# Patient Record
Sex: Female | Born: 1990 | Race: Black or African American | Hispanic: No | Marital: Single | Smoking: Former smoker
Health system: Southern US, Community
[De-identification: ages and names within clinical notes are randomized; demographics above are authoritative.]

## PROBLEM LIST (undated history)

## (undated) ENCOUNTER — Inpatient Hospital Stay (HOSPITAL_COMMUNITY): Payer: Self-pay

## (undated) DIAGNOSIS — L272 Dermatitis due to ingested food: Secondary | ICD-10-CM

## (undated) DIAGNOSIS — IMO0002 Reserved for concepts with insufficient information to code with codable children: Secondary | ICD-10-CM

## (undated) DIAGNOSIS — E559 Vitamin D deficiency, unspecified: Secondary | ICD-10-CM

## (undated) DIAGNOSIS — I10 Essential (primary) hypertension: Secondary | ICD-10-CM

## (undated) DIAGNOSIS — N912 Amenorrhea, unspecified: Secondary | ICD-10-CM

## (undated) DIAGNOSIS — J309 Allergic rhinitis, unspecified: Secondary | ICD-10-CM

## (undated) DIAGNOSIS — R229 Localized swelling, mass and lump, unspecified: Secondary | ICD-10-CM

## (undated) DIAGNOSIS — G4733 Obstructive sleep apnea (adult) (pediatric): Secondary | ICD-10-CM

## (undated) DIAGNOSIS — Z91018 Allergy to other foods: Secondary | ICD-10-CM

## (undated) DIAGNOSIS — N83209 Unspecified ovarian cyst, unspecified side: Secondary | ICD-10-CM

## (undated) DIAGNOSIS — T783XXA Angioneurotic edema, initial encounter: Secondary | ICD-10-CM

## (undated) DIAGNOSIS — D649 Anemia, unspecified: Secondary | ICD-10-CM

## (undated) HISTORY — DX: Allergy to other foods: Z91.018

## (undated) HISTORY — DX: Amenorrhea, unspecified: N91.2

## (undated) HISTORY — DX: Localized swelling, mass and lump, unspecified: R22.9

## (undated) HISTORY — PX: DENTAL SURGERY: SHX609

## (undated) HISTORY — DX: Dermatitis due to ingested food: L27.2

## (undated) HISTORY — DX: Angioneurotic edema, initial encounter: T78.3XXA

## (undated) HISTORY — DX: Allergic rhinitis, unspecified: J30.9

## (undated) HISTORY — PX: WISDOM TOOTH EXTRACTION: SHX21

## (undated) HISTORY — DX: Reserved for concepts with insufficient information to code with codable children: IMO0002

## (undated) HISTORY — DX: Anemia, unspecified: D64.9

---
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
## 2010-03-18 ENCOUNTER — Emergency Department (HOSPITAL_COMMUNITY): Admission: EM | Admit: 2010-03-18 | Discharge: 2010-03-18 | Payer: Self-pay | Admitting: Emergency Medicine

## 2010-05-30 ENCOUNTER — Emergency Department (HOSPITAL_COMMUNITY)
Admission: EM | Admit: 2010-05-30 | Discharge: 2010-05-30 | Payer: Self-pay | Source: Home / Self Care | Admitting: Emergency Medicine

## 2010-08-01 LAB — DIFFERENTIAL
Basophils Absolute: 0 10*3/uL (ref 0.0–0.1)
Basophils Relative: 0 % (ref 0–1)
Eosinophils Absolute: 0.3 10*3/uL (ref 0.0–0.7)
Eosinophils Relative: 3 % (ref 0–5)
Lymphocytes Relative: 39 % (ref 12–46)
Lymphs Abs: 4.2 10*3/uL — ABNORMAL HIGH (ref 0.7–4.0)
Monocytes Absolute: 0.7 10*3/uL (ref 0.1–1.0)
Monocytes Relative: 6 % (ref 3–12)
Neutro Abs: 5.6 10*3/uL (ref 1.7–7.7)
Neutrophils Relative %: 52 % (ref 43–77)

## 2010-08-01 LAB — URINALYSIS, ROUTINE W REFLEX MICROSCOPIC
Bilirubin Urine: NEGATIVE
Glucose, UA: NEGATIVE mg/dL
Hgb urine dipstick: NEGATIVE
Ketones, ur: NEGATIVE mg/dL
Nitrite: NEGATIVE
Protein, ur: NEGATIVE mg/dL
Specific Gravity, Urine: 1.022 (ref 1.005–1.030)
Urobilinogen, UA: 0.2 mg/dL (ref 0.0–1.0)
pH: 6 (ref 5.0–8.0)

## 2010-08-01 LAB — URINE MICROSCOPIC-ADD ON

## 2010-08-01 LAB — POCT I-STAT, CHEM 8
BUN: 6 mg/dL (ref 6–23)
Calcium, Ion: 1.13 mmol/L (ref 1.12–1.32)
Chloride: 108 mEq/L (ref 96–112)
Creatinine, Ser: 0.9 mg/dL (ref 0.4–1.2)
Glucose, Bld: 95 mg/dL (ref 70–99)
HCT: 41 % (ref 36.0–46.0)
Hemoglobin: 13.9 g/dL (ref 12.0–15.0)
Potassium: 3.5 mEq/L (ref 3.5–5.1)
Sodium: 141 mEq/L (ref 135–145)
TCO2: 22 mmol/L (ref 0–100)

## 2010-08-01 LAB — CBC
HCT: 38.9 % (ref 36.0–46.0)
Hemoglobin: 12.7 g/dL (ref 12.0–15.0)
MCH: 25.7 pg — ABNORMAL LOW (ref 26.0–34.0)
MCHC: 32.6 g/dL (ref 30.0–36.0)
MCV: 78.7 fL (ref 78.0–100.0)
Platelets: 256 10*3/uL (ref 150–400)
RBC: 4.94 MIL/uL (ref 3.87–5.11)
RDW: 14.3 % (ref 11.5–15.5)
WBC: 10.8 10*3/uL — ABNORMAL HIGH (ref 4.0–10.5)

## 2010-08-01 LAB — POCT PREGNANCY, URINE: Preg Test, Ur: NEGATIVE

## 2010-09-04 ENCOUNTER — Inpatient Hospital Stay (INDEPENDENT_AMBULATORY_CARE_PROVIDER_SITE_OTHER)
Admission: RE | Admit: 2010-09-04 | Discharge: 2010-09-04 | Disposition: A | Discharge status: Home / Self Care | Payer: Self-pay | Source: Ambulatory Visit | Attending: Emergency Medicine | Admitting: Emergency Medicine

## 2010-09-04 DIAGNOSIS — L259 Unspecified contact dermatitis, unspecified cause: Secondary | ICD-10-CM

## 2010-09-10 ENCOUNTER — Ambulatory Visit: Payer: Self-pay | Admitting: Internal Medicine

## 2010-09-12 ENCOUNTER — Encounter: Payer: Self-pay | Admitting: Internal Medicine

## 2010-09-14 ENCOUNTER — Ambulatory Visit: Payer: Self-pay | Admitting: Internal Medicine

## 2010-09-24 ENCOUNTER — Telehealth: Payer: Self-pay | Admitting: Internal Medicine

## 2010-09-24 ENCOUNTER — Ambulatory Visit (INDEPENDENT_AMBULATORY_CARE_PROVIDER_SITE_OTHER): Payer: Medicaid Other | Admitting: Internal Medicine

## 2010-09-24 ENCOUNTER — Encounter: Payer: Self-pay | Admitting: Internal Medicine

## 2010-09-24 VITALS — BP 110/68 | HR 76 | Ht 63.0 in | Wt 238.0 lb

## 2010-09-24 DIAGNOSIS — L299 Pruritus, unspecified: Secondary | ICD-10-CM

## 2010-09-24 NOTE — Telephone Encounter (Signed)
OV with CDY at 4:15 today- okayed per CDY.

## 2010-09-24 NOTE — Patient Instructions (Signed)
Try 2 different kinds of antihistamine medicines at once:  Ranitidine 150 mg twice every day- sold as a stomach acid medicine  And  generic Allegra 180-   called fexofenadine 180  For 24 hour/ once daily use.   You can still use the cortisone cream if you have to.

## 2010-09-24 NOTE — Progress Notes (Signed)
  Subjective:    Patient ID: Brittany Cordova, female    DOB: Sep 22, 1990, 20 y.o.   MRN: [id number]  HPI 09/24/10- 20 yoF concerned about generalized itching w/o visible rash.  Last seen 2 years ago for rash,- old note reviewed.  Rash and itching started again about a week ago. Generalized.   She went to urgent care and was given triamcinolone cream and hydroxyzine  Off these meds, she is beginning to itch again- intertriginious areas, trunk and back. Doesn't describe large welts. Denies exposures including fleas. Questions heat. No fever, obvious infection, arthralgias or unique exposures. She is in school, with no unusual exposures.    Review of Systems Constitutional:   No weight loss, night sweats,  Fevers, chills, fatigue, lassitude. HEENT:   No headaches,  Difficulty swallowing,  Tooth/dental problems,  Sore throat,                No sneezing, itching, ear ache, nasal congestion, post nasal drip,   CV:  No chest pain,  Orthopnea, PND, swelling in lower extremities, anasarca, dizziness, palpitations  GI  No heartburn, indigestion, abdominal pain, nausea, vomiting, diarrhea, change in bowel habits, loss of appetite  Resp: No shortness of breath with exertion or at rest.  No excess mucus, no productive cough,  No non-productive cough,  No coughing up of blood.  No change in color of mucus.  No wheezing. Skin: no rash or lesions.  GU: no dysuria, change in color of urine, no urgency or frequency.  No flank pain.  MS:  No joint pain or swelling.  No decreased range of motion.  No back pain.  Psych:  No change in mood or affect. No depression or anxiety.  No memory loss.      Objective:   Physical Exam General- Alert, Oriented, Affect-appropriate, Distress- none acute  Skin- rash- dry skin with a few tiny pinpoint red marks on upper chest, no excoriation  Lymphadenopathy- none  Head- atraumatic  Eyes- Gross vision intact, PERRLA, conjunctivae clear secretions  Ears- Hearing,  canals, Tm L ,   R ,- normal  Nose- Clear, No-Septal dev, mucus, polyps, erosion, perforation   Throat- Mallampati II , mucosa clear , drainage- none, tonsils- atrophic  Neck- flexible , trachea midline, no stridor , thyroid nl, carotid no bruit  Chest - symmetrical excursion , unlabored     Heart/CV- RRR , no murmur , no gallop  , no rub, nl s1 s2                     - JVD- none , edema- none, stasis changes- none, varices- none     Lung- clear to P&A, wheeze- none, cough- none , dullness-none, rub- none     Chest wall- Abd- tender-no, distended-no, bowel sounds-present, HSM- no  Br/ Gen/ Rectal- Not done, not indicated  Extrem- cyanosis- none, clubbing, none, atrophy- none, strength- nl  Neuro- grossly intact to observation          Assessment & Plan:

## 2010-09-24 NOTE — Telephone Encounter (Signed)
Spoke w/ pt and she states she has had a rash all over her body since the beginning of April. Pt states she could not get an apt w/ Dr. Maple Hudson so she went to urgent care April 17 and they gave her tricinolone cream and pills for itching. Pt states she has also been using hydrocortisone cream, taking oatmeal baths and using oatmeal shower gel. Pt was last seen 06/2008 and told to f/u in 6 months. Next available isn't until 6/12 and pt states she can't wait that long to be seen. Please advise Dr. Maple Hudson. Thanks  Allergies  Allergen Reactions  . Ibuprofen     Carver Fila, CMA

## 2010-09-30 ENCOUNTER — Encounter: Payer: Self-pay | Admitting: Internal Medicine

## 2010-09-30 DIAGNOSIS — L299 Pruritus, unspecified: Secondary | ICD-10-CM | POA: Insufficient documentation

## 2010-09-30 NOTE — Assessment & Plan Note (Addendum)
This is more than dry skin probably, and may be an allergic response.We will start with broad antihistamine approach I discussed lisinpopril

## 2010-10-02 NOTE — Assessment & Plan Note (Signed)
[facility]                             PULMONARY OFFICE NOTE   EDITH, GROLEAU                   MRN:          [id number]  DATE:03/27/2007                            DOB:          [birth year]    Date of visit:  March 27, 2007.   PROBLEM:  A 20 year old African-American teenager comes with her mother  at the kind request of Dr. Bruna Potter, concerned about her breathing during  sleep.   HISTORY:  She and her mother co-sleep, although the daughter sleeps  above the sheet and mother sleeps under it.  They began this when mother  had a stroke.  Mother tells her that she stops breathing and snores  loudly.  The patient notices occasional hot flashes and cannot get back  to sleep.  She wakes in the morning with dry mouth consistent with mouth  breathing.  Bedtime is between 9 and 10 p.m.  Sometimes sleep latency  can be over an hour, waking 2-3 times during the night before up at 7:30  a.m. for school.   REVIEW OF SYSTEMS:  Snoring without nasal congestion, witnessed apneas,  difficulty initiating sleep, nonrestorative sleep.  Sleepiness during  the day can interfere with schoolwork.  Some bruxism.  No unusual  behaviors.   PAST MEDICAL HISTORY:  No surgeries.  Some allergic rhinitis.  She  believes thyroid lab work has tested normal.   OBJECTIVE:  Weight 232 pounds, blood pressure 118/76, pulse regular and  92, room air saturation 100%.  She is an obese, quiet, calm young lady, alert and oriented.  NEUROLOGIC:  Unremarkable through observation.  NECK:  Short, thick neck.  No thyromegaly.  HEENT:  Nasal airway is unobstructed.  Palate is spacing 3-4/4 without  stridor.  LUNGS:  Lung fields clear.  HEART:  Heart sounds normal.   IMPRESSION:  The primary issue is whether she has obstructive sleep  apnea.  We have discussed improvements in sleep hygiene.   PLAN:  We are scheduling a split sleep study at the [facility] center  and she will  return after that has been completed for follow-up.  I  appreciate the chance to see her.     Clinton D. Maple Hudson, MD, [facility], FACP  Electronically Signed    CDY/MedQ  DD: 03/27/2007  DT: 03/29/2007  Job #: [id number]   cc:   Clyda Greener, M.D.  [facility]

## 2010-10-02 NOTE — Assessment & Plan Note (Signed)
[facility]                             PULMONARY OFFICE NOTE   [name], [name]                   MRN:          [id number]  DATE:04/23/2007                            DOB:          Apr 16, 1991    SLEEP MEDICINE FOLLOW-UP:   PROBLEM:  Hypersomnia.   HISTORY:  She and her mother return after her nocturnal polysomnogram,  which was unremarkable.  Sleep architecture was notable only for  frequent wakings, not unexpected in an unfamiliar environment,  especially for a young person.  There was no significant sleep-related  breathing disorder with an AHI of 0 and little to no snoring noted.  There was no movement disorder.  On discussion with her today, she is  unclear as to whether she recognizes symptoms that I described,  exploring the possibilities of sleep paralysis or cataplexy.  Her  Epworth Sleepiness Score was 10/17.   OBJECTIVE:  Weight 235 pounds, BP 122/70, pulse 91, room air saturation  100%.  This is an obese, somewhat reserved young lady.  Breathing is unlabored.  Pulse regular.   IMPRESSION:  Nonspecific hypersomnia.  She describes a regular sleep  schedule with limited caffeine intake.  It is possible that she has a  mild partial narcolepsy syndrome or idiopathic hypersomnia.   PLAN:  1. She was given a booklet on sleep hygiene and we discussed good      sleep practices.  2. Schedule multiple sleep latency test for comparison with her      previous nocturnal polysomnogram.  3. Schedule return in 1 month, earlier p.r.n.     Clinton D. Maple Hudson, MD, [facility], FACP  Electronically Signed    CDY/MedQ  DD: 04/23/2007  DT: 04/23/2007  Job #: [id number]   cc:   Clyda Greener, MD  [facility]

## 2010-10-02 NOTE — Group Therapy Note (Signed)
NAMEDAIZHA, ANAND NO.:  [id number]   MEDICAL RECORD NO.:  [id number]          PATIENT TYPE:  WOC   LOCATION:  [facility]                   FACILITY:  [facility]   PHYSICIAN:  Argentina Donovan, MD        DATE OF BIRTH:  05/11/1991   DATE OF SERVICE:  12/02/2007                                  CLINIC NOTE   REASON FOR VISIT:  The patient was referred here for a PCOS workup.   SUBJECTIVE:  Ms. Filsinger is a very pleasant 20 year old female who  presented today upon referral by the [facility] over the  Health Department for a PCOS workup.  Over there, they had noted her  morbid obesity and acanthosis nigricans, and they were concerned that  she had not had her periods in 2 to 3 years.  She has been on Depo-  Provera since the age of 59, and this is when her periods became  irregular and eventually stopped.  Prior to that, her periods are very  regular overall, and she states she would have a cycle once a month that  lasted approximately 5 days.  She denies today having any unusual hair  grows or other concerns.   MENSTRUAL HISTORY:  She began menstruating at age 38, and menstrual  cycles were regular prior to starting Depo-Provera.  Her periods lasted  approximately 5 days and was a light flow.  She did not complain of any  pain with her periods.  Currently, she is using Depo-Provera for her  contraception and is not trying to get pregnant.  She has never been  pregnant before.  She is up to date on her Pap smear, and her last one  was performed on August 12, 2007, but will need to be repeated due to  inadequate sample.   She has never had any surgeries.   She does have a very strong family history of diabetes, high blood  pressure, strokes, and blood clots.   She has no personal medical history.   SOCIAL HISTORY:  She currently lives at home with her mother and her  grandmother, and she does not work.  She does not smoke or use alcohol,  and does not use  drugs.   REVIEW OF SYSTEMS:  Positive for swelling in the hands and feet, which  has been chronic.  She also complains of some shortness of breath and  dizzy spells.   PHYSICAL EXAMINATION:  GENERAL:  This is a pleasant, morbidly obese,  African American female in no apparent distress.  VITAL SIGNS:  Temperature is 97.6, pulse 75, blood pressure 112/74,  weight 229.5 pounds, height 5 feet 2 inches, and respiratory rate 20.  CARDIOVASCULAR:  Regular rate and rhythm.  No murmurs, rubs, or gallops.  LUNGS:  Clear to auscultation bilaterally.  ABDOMEN:  Nontender and nondistended.  Soft with positive bowel sounds.  No hepatosplenomegaly is appreciated.  There are striae across the  abdomen.  SKIN:  Acanthosis nigricans along the neck and antecubital fossa  bilaterally.  EXTREMITIES:  Normal distal pulses and no appreciable swelling.   ASSESSMENT/PLAN:  Ms. Spengler is an  20 year old female with morbid  obesity at risk for metabolic syndrome.   PLAN:  The patient has already had due workup over the [facility] the [facility] in terms of evaluating  her thyroid and getting her to a nutritionist to start a diet and  exercise plan.  The patient has been following this closely and has  actually lost 4 pounds in a month per her report.  It was encouraged  that she continue to avoid further problems to her body in terms of high  blood pressure, diabetes, or PCOS given her  strong family history.  The  patient agrees with this plan, so she desired to get pregnant at a later  point.  I would consider starting Yasmin for short period of time to  regulate her cycles and metformin to help her ovulate.  I would continue  to monitor her very closely in terms of her cholesterol and her blood  pressure as well as her blood sugars to make sure she does not develop  any of the consequences of the metabolic syndrome.           ______________________________  Argentina Donovan, MD     PR/MEDQ  D:  12/03/2007  T:  12/04/2007  Job:  [id number]

## 2010-10-02 NOTE — Procedures (Signed)
Brittany Cordova, Brittany Cordova            ACCOUNT NO.:  [id number]   MEDICAL RECORD NO.:  [id number]          PATIENT TYPE:  OUT   LOCATION:  SLEEP CENTER                 FACILITY:  [facility]   PHYSICIAN:  Clinton D. Maple Hudson, MD, FCCP, FACPDATE OF BIRTH:  1990-01-[birth year]   DATE OF STUDY:  04/09/2007                            NOCTURNAL POLYSOMNOGRAM   REFERRING PHYSICIAN:  Clinton D. Young, MD, FCCP, FACP   INDICATIONS FOR STUDY:  Hypersomnia with sleep apnea.   EPWORTH SLEEPINESS SCORE:  10/24.   BMI 41.2, weight 225 pounds, height 62 inches, neck 15 inches.   MEDICATIONS:  No home medications were listed.   SLEEP ARCHITECTURE:  Total sleep time 334 minutes with sleep efficiency  845. Stage 1 was 5%; Stage 2, 65%; Stage 3, 18%; REM 10.9% of total  sleep time. Sleep latency 40 minutes, REM latency 117 minutes. Awake  after sleep onset 22 minutes. Arousal index 6.8. No bedtime medication  listed.   RESPIRATORY DATA:  Apnea hypopnea index (AHI) 0 obstructive events per  hour, which is normal.   OXYGEN DATA:  No significant snoring. Oxygen desaturation to a nadir of  91% on room air with mean oxygen saturation through the study 97%.   CARDIAC DATA:  Normal sinus rhythm.   MOVEMENT/PARASOMNIA:  No significant limb jerks. No bathroom trips.   IMPRESSION/RECOMMENDATIONS:  1. Sleep architecture was remarkable primarily for frequent brief      waking's, attributable to the unfamiliar sleep environment and      reduced time in REM.  2. No significant sleep related breathing disorder. AHI zero in all      sleep positions with no snoring noted by the technician.     Clinton D. Maple Hudson, MD, [facility], FACP  Diplomate, Biomedical engineer of Sleep Medicine  Electronically Signed    CDY/MEDQ  D:  04/12/2007 15:34:18  T:  04/12/2007 20:44:26  Job:  [id number]

## 2010-10-29 ENCOUNTER — Encounter: Payer: Self-pay | Admitting: Internal Medicine

## 2010-11-01 ENCOUNTER — Encounter: Payer: Self-pay | Admitting: Internal Medicine

## 2010-11-01 ENCOUNTER — Ambulatory Visit (INDEPENDENT_AMBULATORY_CARE_PROVIDER_SITE_OTHER): Payer: Medicaid Other | Admitting: Internal Medicine

## 2010-11-01 VITALS — BP 120/74 | HR 73 | Ht 63.0 in | Wt 231.4 lb

## 2010-11-01 DIAGNOSIS — G4733 Obstructive sleep apnea (adult) (pediatric): Secondary | ICD-10-CM

## 2010-11-01 DIAGNOSIS — L299 Pruritus, unspecified: Secondary | ICD-10-CM

## 2010-11-01 NOTE — Patient Instructions (Signed)
Order- [facility]- Split protocol NPSG - dx OSA.    Tell the sleep center she may need to sleep in a recliner

## 2010-11-01 NOTE — Assessment & Plan Note (Signed)
Body habitus, long palate and hx snoring favor dx OSA, with potential cardiovascular consequences. Lungs sound clear and neck veins aren't distended, but she has been putting up with orthopnea and substernal discomfort for months now probably. We will arrange new NPSG, which might have to be done in a recliner.

## 2010-11-01 NOTE — Assessment & Plan Note (Signed)
Doing better with Aveeno. We discussed Cetaphil and Eucerin.

## 2010-11-01 NOTE — Progress Notes (Signed)
Subjective:    Patient ID: Brittany Cordova Reasons, female    DOB: 19-Dec-1990, 20 y.o.   MRN: [id number]  HPI  Subjective:    Patient ID: Brittany Cordova Reasons, female    DOB: 04-26-1991, 20 y.o.   MRN: [id number]  HPI 09/24/10- 20 yoF concerned about generalized itching w/o visible rash.  Last seen 2 years ago for rash,- old note reviewed.  Rash and itching started again about a week ago. Generalized.   She went to urgent care and was given triamcinolone cream and hydroxyzine  Off these meds, she is beginning to itch again- intertriginious areas, trunk and back. Doesn't describe large welts. Denies exposures including fleas. Questions heat. No fever, obvious infection, arthralgias or unique exposures. She is in school, with no unusual exposures.   11/01/10- Previously seen for allergy evaluation of itching.  Now New Problem:  referred by [facility] for evaluation of sleep. Dr Italy Hilty. A sleep study 04/09/07 was negative , AHI 0/hr.  Now she has cardiac enlargement and she says there may be some question about a mass near her heart which she thinks Dr Rennis Golden is evaluating.  Mother tells her she snores. Feels mild orthopnea over last several months- sleeps poorly.  Wakes occasionally - nocturia x 2-3, but no choke or strangle. Prefers to sleep on sides, but that gives substernal soreness. Cough productive clear mucus. Nose tends to be congested. No ENT surgery.   Review of Systems Constitutional:   No weight loss, night sweats,  Fevers, chills, fatigue, lassitude. HEENT:   No headaches,  Difficulty swallowing,  Tooth/dental problems,  Sore throat,                No sneezing, itching, ear ache, nasal congestion, post nasal drip,   CV:  No chest pain,  Orthopnea, PND, swelling in lower extremities, anasarca, dizziness, palpitations  GI  No heartburn, indigestion, abdominal pain, nausea, vomiting, diarrhea, change in bowel habits, loss of appetite  Resp: No acute shortness of breath with exertion or at  rest.  No excess mucus, no productive cough,  No non-productive cough,  No coughing up of blood.  No change in color of mucus.  No wheezing. Skin: no rash or lesions.  GU: no dysuria, change in color of urine, no urgency or frequency.  No flank pain.  MS:  No joint pain or swelling.  No decreased range of motion.  No back pain.  Psych:  No change in mood or affect. No depression or anxiety.  No memory loss.      Objective:   Physical Exam General- Alert, Oriented, Affect-appropriate, Distress- none acute   obese Skin-Mild eczematoid hyperkeratotic skin at neckline. She shows a couple of tiny, light brown spots on skin of forearms and legs- possibly insect bites, not excoriated.   Lymphadenopathy- none  Head- atraumatic  Eyes- Gross vision intact, PERRLA, conjunctivae clear secretions  Ears- Hearing, canals, Tm- normal  Nose- Clear, No-Septal dev, mucus, polyps, erosion, perforation   Throat- Mallampati III-IV , mucosa clear , drainage- none, tonsils- atrophic  Tongue stud  Neck- flexible , trachea midline, no stridor , thyroid nl, carotid no bruit   Short thick neck  Chest - symmetrical excursion , unlabored     Heart/CV- RRR , no murmur , no gallop  , no rub, nl s1 s2                     - JVD- none , edema- none, stasis changes- none,  varices- none     Lung- clear to P&A, wheeze- none, cough- none , dullness-none, rub- none     Chest wall- Abd- tender-no, distended-no, bowel sounds-present, HSM- no  Br/ Gen/ Rectal- Not done, not indicated  Extrem- cyanosis- none, clubbing, none, atrophy- none, strength- nl  Neuro- grossly intact to observation          Assessment & Plan:     Review of Systems     Objective:   Physical Exam        Assessment & Plan:

## 2010-11-26 ENCOUNTER — Ambulatory Visit (HOSPITAL_BASED_OUTPATIENT_CLINIC_OR_DEPARTMENT_OTHER): Payer: Self-pay | Attending: Internal Medicine

## 2010-11-26 DIAGNOSIS — G4710 Hypersomnia, unspecified: Secondary | ICD-10-CM | POA: Insufficient documentation

## 2010-11-26 DIAGNOSIS — G4733 Obstructive sleep apnea (adult) (pediatric): Secondary | ICD-10-CM

## 2010-12-01 DIAGNOSIS — G4710 Hypersomnia, unspecified: Secondary | ICD-10-CM

## 2010-12-01 DIAGNOSIS — G4730 Sleep apnea, unspecified: Secondary | ICD-10-CM

## 2010-12-01 NOTE — Procedures (Signed)
NAMEGLENDORA, CLOUATRE            ACCOUNT NO.:  [id number]  MEDICAL RECORD NO.:  [id number]          PATIENT TYPE:  OUT  LOCATION:  SLEEP CENTER                 FACILITY:  [facility]  PHYSICIAN:  Clinton D. Maple Hudson, MD, FCCP, FACPDATE OF BIRTH:  [birth year] 16[birth year] 1989  DATE OF STUDY:  11/26/2010                           NOCTURNAL POLYSOMNOGRAM  REFERRING PHYSICIAN:  CLINTON D YOUNG  INDICATION FOR STUDY:  Hypersomnia with sleep apnea.  EPWORTH SLEEPINESS SCORE:  Epworth sleepiness score 9/24, BMI 40.9. Weight 231 pounds, height 63 inches.  Neck 15 inches.  Home medications are charted and reviewed.  A baseline diagnostic NPSG on April 09, 2007 had recorded AHI of 0 per hour.  MEDICATIONS:  SLEEP ARCHITECTURE:  Total sleep time 195 minutes with sleep efficiency 46.2%.  Stage I was 16.4%, stage II 77.4%, stage III 2.6%, REM 3.6% of total sleep time.  Sleep latency 170 minutes, REM latency 110 minutes, awake after sleep onset 57.5 minutes, arousal index 45.2.  Bedtime medication:  None.  Sleep architecture was significant for sleep onset shortly after 1 a.m. and frequent brief spontaneous waking throughout the study night.  She slept in a recliner chair.  RESPIRATORY DATA:  Apnea/hypopnea index (AHI) 0 per hour.  No scored respiratory events were noted.  OXYGEN DATA:  No snoring noted by the technician.  Oxygen desaturation to a nadir of 95% and mean oxygen saturation of 98% on room air.  CARDIAC DATA:  Normal sinus rhythm.  MOVEMENT-PARASOMNIA:  No significant movement disturbance.  No bathroom trips.  IMPRESSIONS-RECOMMENDATIONS: 1. Short total sleep with delayed sleep onset until 1 a.m. and     frequent nonspecific brief spontaneous waking throughout the study.     Consider possibility of delayed sleep phase syndrome. 2. No significant respiratory disturbance.  AHI 0 per hour (normal     range 0-5 per hour).  No snoring.  Oxygen desaturation to a nadir     of 95% with a mean oxygen  saturation through the study of 98% on     room air.     Clinton D. Maple Hudson, MD, [facility], FACP Diplomate, Biomedical engineer of Sleep Medicine Electronically Signed    CDY/MEDQ  D:  12/01/2010 11:50:45  T:  12/01/2010 21:19:32  Job:  [id number]

## 2010-12-05 ENCOUNTER — Encounter: Payer: Self-pay | Admitting: Internal Medicine

## 2010-12-05 ENCOUNTER — Ambulatory Visit (INDEPENDENT_AMBULATORY_CARE_PROVIDER_SITE_OTHER): Payer: Self-pay | Admitting: Internal Medicine

## 2010-12-05 VITALS — BP 116/80 | HR 61 | Ht 63.0 in | Wt 228.2 lb

## 2010-12-05 DIAGNOSIS — G4733 Obstructive sleep apnea (adult) (pediatric): Secondary | ICD-10-CM

## 2010-12-05 DIAGNOSIS — J309 Allergic rhinitis, unspecified: Secondary | ICD-10-CM

## 2010-12-05 NOTE — Assessment & Plan Note (Signed)
No sleep disordered breathing. 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  I will see her back if needed.

## 2010-12-05 NOTE — Progress Notes (Signed)
Subjective:    Patient ID: Brittany Cordova, female    DOB: Jun 07, 1990, 20 y.o.   MRN: [id number]  HPI  Subjective:    Patient ID: Brittany Cordova, female    DOB: Dec 27, 1990, 20 y.o.   MRN: [id number]  HPI  Subjective:    Patient ID: Brittany Cordova, female    DOB: [birth year], 20 y.o.   MRN: [id number]  HPI 09/24/10- 20 yoF concerned about generalized itching w/o visible rash.  Last seen 2 years ago for rash,- old note reviewed.  Rash and itching started again about a week ago. Generalized.   She went to urgent care and was given triamcinolone cream and hydroxyzine  Off these meds, she is beginning to itch again- intertriginious areas, trunk and back. Doesn't describe large welts. Denies exposures including fleas. Questions heat. No fever, obvious infection, arthralgias or unique exposures. She is in school, with no unusual exposures.   11/01/10- Previously seen for allergy evaluation of itching.  Now New Problem:  referred by [facility] for evaluation of sleep. Dr [place] Hilty. A sleep study 04/09/07 was negative , AHI 0/hr.  Now she has cardiac enlargement and she says there may be some question about a mass near her heart which she thinks Dr Rennis Golden is evaluating.  Mother tells her she snores. Feels mild orthopnea over last several months- sleeps poorly.  Wakes occasionally - nocturia x 2-3, but no choke or strangle. Prefers to sleep on sides, but that gives substernal soreness. Cough productive clear mucus. Nose tends to be congested. No ENT surgery.   12/05/10- 20 yoF followed for hx of itching and for snoring/ sleep apnea ruled out, hx angioedema,  NPSG- 11/26/10- delayed sleep onset, but no OSA- AHI 0/hr with normal O2 sats.  Admits trying but unable to fall asleep typically until late. Discussed sleep hygeine and suggested an otc sleep aid or benadryl for occasional use if needed.   Review of Systems Constitutional:   No weight loss, night sweats,  Fevers, chills, fatigue,  lassitude. HEENT:   No headaches,  Difficulty swallowing,  Tooth/dental problems,  Sore throat,                No sneezing, itching, ear ache, nasal congestion, post nasal drip,   CV:  No chest pain,  Orthopnea, PND, swelling in lower extremities, anasarca, dizziness, palpitations  GI  No heartburn, indigestion, abdominal pain, nausea, vomiting, diarrhea, change in bowel habits, loss of appetite  Resp: No acute shortness of breath with exertion or at rest.  No excess mucus, no productive cough,  No non-productive cough,  No coughing up of blood.  No change in color of mucus.  No wheezing. Skin: no rash or lesions.  GU: no dysuria, change in color of urine, no urgency or frequency.  No flank pain.  MS:  No joint pain or swelling.  No decreased range of motion.  No back pain.  Psych:  No change in mood or affect. No depression or anxiety.  No memory loss.      Objective:   Physical Exam General- Alert, Oriented, Affect-appropriate, Distress- none acute   obese Skin-Mild eczematoid hyperkeratotic skin at neckline.  Lymphadenopathy- none  Head- atraumatic  Eyes- Gross vision intact, PERRLA, conjunctivae clear secretions  Ears- Hearing, canals, Tm- normal  Nose- Clear, No-Septal dev, mucus, polyps, erosion, perforation   Throat- Mallampati III-IV , mucosa clear , drainage- none, tonsils- atrophic  Tongue stud  Neck- flexible , trachea midline, no stridor , thyroid  nl, carotid no bruit   Short thick neck  Chest - symmetrical excursion , unlabored     Heart/CV- RRR , no murmur , no gallop  , no rub, nl s1 s2                     - JVD- none , edema- none, stasis changes- none, varices- none     Lung- clear to P&A, wheeze- none, cough- none , dullness-none, rub- none     Chest wall- Abd- tender-no, distended-no, bowel sounds-present, HSM- no  Br/ Gen/ Rectal- Not done, not indicated  Extrem- cyanosis- none, clubbing, none, atrophy- none, strength- nl  Neuro- grossly intact  to observation          Assessment & Plan:     Review of Systems     Objective:   Physical Exam        Assessment & Plan:     Review of Systems     Objective:   Physical Exam        Assessment & Plan:

## 2010-12-05 NOTE — Patient Instructions (Signed)
People usually sleep best in a quiet, cool, dark bedroom. Avoid caffeine later in the day, even if you don't think it affects you much. You could try an over the counter sleep aid- there are several, including benadryl.

## 2010-12-08 NOTE — Assessment & Plan Note (Signed)
Not current complaint. Maybe seasonal. She has otc options, and will return about this if needed.

## 2010-12-25 ENCOUNTER — Emergency Department (HOSPITAL_COMMUNITY)
Admission: EM | Admit: 2010-12-25 | Discharge: 2010-12-25 | Disposition: A | Discharge status: Home / Self Care | Payer: Self-pay | Attending: Emergency Medicine | Admitting: Emergency Medicine

## 2010-12-25 DIAGNOSIS — M545 Low back pain, unspecified: Secondary | ICD-10-CM | POA: Insufficient documentation

## 2010-12-25 DIAGNOSIS — X58XXXA Exposure to other specified factors, initial encounter: Secondary | ICD-10-CM | POA: Insufficient documentation

## 2010-12-25 DIAGNOSIS — M546 Pain in thoracic spine: Secondary | ICD-10-CM | POA: Insufficient documentation

## 2010-12-25 DIAGNOSIS — IMO0002 Reserved for concepts with insufficient information to code with codable children: Secondary | ICD-10-CM | POA: Insufficient documentation

## 2010-12-25 DIAGNOSIS — Y92009 Unspecified place in unspecified non-institutional (private) residence as the place of occurrence of the external cause: Secondary | ICD-10-CM | POA: Insufficient documentation

## 2010-12-25 DIAGNOSIS — J45909 Unspecified asthma, uncomplicated: Secondary | ICD-10-CM | POA: Insufficient documentation

## 2010-12-25 LAB — URINALYSIS, ROUTINE W REFLEX MICROSCOPIC
Bilirubin Urine: NEGATIVE
Glucose, UA: NEGATIVE mg/dL
Hgb urine dipstick: NEGATIVE
Leukocytes, UA: NEGATIVE
Nitrite: NEGATIVE
Protein, ur: NEGATIVE mg/dL
Specific Gravity, Urine: 1.023 (ref 1.005–1.030)
Urobilinogen, UA: 0.2 mg/dL (ref 0.0–1.0)
pH: 6.5 (ref 5.0–8.0)

## 2011-02-13 LAB — RAPID STREP SCREEN (MED CTR MEBANE ONLY): Streptococcus, Group A Screen (Direct): NEGATIVE

## 2011-02-19 LAB — COMPREHENSIVE METABOLIC PANEL
AST: 21
Albumin: 3.5
Alkaline Phosphatase: 61
BUN: 6
CO2: 25
Chloride: 110
GFR calc non Af Amer: >60
Potassium: 3.8
Total Bilirubin: 0.5

## 2011-02-19 LAB — DIFFERENTIAL
Basophils Absolute: 0
Basophils Relative: 0
Eosinophils Absolute: 0.1
Eosinophils Relative: 1
Lymphocytes Relative: 19
Lymphs Abs: 2.6
Monocytes Absolute: 0.5
Monocytes Relative: 4
Neutro Abs: 10.3 — ABNORMAL HIGH
Neutrophils Relative %: 76

## 2011-02-19 LAB — POCT I-STAT, CHEM 8
BUN: 7
Calcium, Ion: 1.26
Chloride: 103
Creatinine, Ser: 0.8
Creatinine, Ser: 0.9
Glucose, Bld: 99
Glucose, Bld: 99
HCT: 35 — ABNORMAL LOW
Hemoglobin: 11.9 — ABNORMAL LOW
Hemoglobin: 12.2
Potassium: 3.5
Sodium: 141
Sodium: 144
TCO2: 25
TCO2: 25

## 2011-02-19 LAB — CBC
HCT: 36.1
Hemoglobin: 11.7 — ABNORMAL LOW
MCHC: 32.4
MCV: 74.1 — ABNORMAL LOW
Platelets: 287
RBC: 4.88
RDW: 15.8 — ABNORMAL HIGH
WBC: 13.6 — ABNORMAL HIGH

## 2011-02-19 LAB — URINALYSIS, ROUTINE W REFLEX MICROSCOPIC
Bilirubin Urine: NEGATIVE
Glucose, UA: NEGATIVE
Hgb urine dipstick: NEGATIVE
Ketones, ur: NEGATIVE
Nitrite: NEGATIVE
Protein, ur: NEGATIVE
Specific Gravity, Urine: 1.027
Urobilinogen, UA: 0.2
pH: 8

## 2011-02-19 LAB — PREGNANCY, URINE: Preg Test, Ur: NEGATIVE

## 2011-02-19 LAB — URINE MICROSCOPIC-ADD ON

## 2011-04-08 ENCOUNTER — Inpatient Hospital Stay (HOSPITAL_COMMUNITY)
Admission: AD | Admit: 2011-04-08 | Discharge: 2011-04-08 | Disposition: A | Discharge status: Home / Self Care | Payer: Self-pay | Source: Ambulatory Visit | Attending: Obstetrics & Gynecology | Admitting: Obstetrics & Gynecology

## 2011-04-08 DIAGNOSIS — N949 Unspecified condition associated with female genital organs and menstrual cycle: Secondary | ICD-10-CM | POA: Insufficient documentation

## 2011-04-08 DIAGNOSIS — A6000 Herpesviral infection of urogenital system, unspecified: Secondary | ICD-10-CM | POA: Insufficient documentation

## 2011-04-08 DIAGNOSIS — N390 Urinary tract infection, site not specified: Secondary | ICD-10-CM | POA: Insufficient documentation

## 2011-04-08 DIAGNOSIS — B009 Herpesviral infection, unspecified: Secondary | ICD-10-CM

## 2011-04-08 LAB — URINALYSIS, ROUTINE W REFLEX MICROSCOPIC
Glucose, UA: NEGATIVE mg/dL
Ketones, ur: NEGATIVE mg/dL
Protein, ur: 100 mg/dL — AB

## 2011-04-08 LAB — URINE MICROSCOPIC-ADD ON

## 2011-04-08 MED ORDER — VALACYCLOVIR HCL 1 G PO TABS: 1000.0000 mg | ORAL_TABLET | Freq: Two times a day (BID) | ORAL | Status: AC

## 2011-04-08 MED ORDER — CEPHALEXIN 500 MG PO CAPS: 500.0000 mg | ORAL_CAPSULE | Freq: Four times a day (QID) | ORAL | Status: AC

## 2011-04-08 NOTE — Progress Notes (Signed)
Pain in lower stomach and buring with urination-for the last "couple days"

## 2011-04-08 NOTE — ED Provider Notes (Signed)
History   Pt presents today c/o lower pelvic pain, vag irritation, and dysuria that has been worsening for the past 3 days. She denies fever, vag bleeding, or any other sx at this time.  Chief Complaint  Patient presents with  . Abdominal Pain   HPI  OB History    Grav Para Term Preterm Abortions TAB SAB Ect Mult Living                  Past Medical History  Diagnosis Date  . Dermatitis due to food taken internally   . 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 Anemia, unspecified   . Allergic rhinitis, cause unspecified   . Amenorrhea     on BC hormone inj  . 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 Asthma   . Mass     heart    Past Surgical History  Procedure Date  . Wisdom tooth extraction     Family History  Problem Relation Age of Onset  . Allergies Father     tomatoes  . Asthma Mother   . Diabetes Other     grandmother  . Cancer Paternal Grandmother   . Cancer Maternal Uncle     [employer]  . Cancer Other     great aunts    History  Substance Use Topics  . Smoking status: Former Smoker -- 1 years    Types: Cigars    Quit date: 08/19/2010  . Smokeless tobacco: Never Used  . Alcohol Use: No    Allergies:  Allergies  Allergen Reactions  . Ibuprofen Anaphylaxis and Swelling    Prescriptions prior to admission  Medication Sig Dispense Refill  . lisinopril (PRINIVIL,ZESTRIL) 10 MG tablet Take 10 mg by mouth daily.          Review of Systems  Constitutional: Negative for fever.  Cardiovascular: Negative for chest pain.  Gastrointestinal: Positive for abdominal pain. Negative for nausea, vomiting, diarrhea and constipation.  Genitourinary: Positive for dysuria. Negative for urgency, frequency and hematuria.  Neurological: Negative for dizziness and headaches.  Psychiatric/Behavioral: Negative for depression and suicidal ideas.   Physical Exam   Blood pressure 141/102, pulse 101, temperature 98.1 F (36.7 C), temperature source Oral, resp. rate 20, height  5\' 3"  (1.6 m), weight 227 lb (102.967 kg), SpO2 97.00%.  Physical Exam  Nursing note and vitals reviewed. Constitutional: She is oriented to person, place, and time. She appears well-developed and well-nourished. No distress.  HENT:  Head: Normocephalic and atraumatic.  GI: Soft. She exhibits no distension. There is no tenderness. There is no rebound and no guarding.  Genitourinary: There is lesion (ulcerative lesion noted on Rt labia just lateral to the introitus.) on the right labia. There is bleeding around the vagina. Vaginal discharge found.  Neurological: She is alert and oriented to person, place, and time.  Skin: Skin is warm and dry. She is not diaphoretic.  Psychiatric: She has a normal mood and affect. Her behavior is normal. Judgment and thought content normal.    MAU Course  Procedures  Wet prep, GC/Chlamydia culture, and HSV culture done.  Results for orders placed during the hospital encounter of 04/08/11 (from the past 24 hour(s))  URINALYSIS, ROUTINE W REFLEX MICROSCOPIC     Status: Abnormal   Collection Time   04/08/11  8:40 PM      Component Value Range   Color, Urine YELLOW  YELLOW    Appearance HAZY (*) CLEAR    Specific Gravity, Urine >  1.030 (*) 1.005 - 1.030    pH 6.0  5.0 - 8.0    Glucose, UA NEGATIVE  NEGATIVE (mg/dL)   Hgb urine dipstick SMALL (*) NEGATIVE    Bilirubin Urine NEGATIVE  NEGATIVE    Ketones, ur NEGATIVE  NEGATIVE (mg/dL)   Protein, ur 130 (*) NEGATIVE (mg/dL)   Urobilinogen, UA 0.2  0.0 - 1.0 (mg/dL)   Nitrite NEGATIVE  NEGATIVE    Leukocytes, UA TRACE (*) NEGATIVE   URINE MICROSCOPIC-ADD ON     Status: Abnormal   Collection Time   04/08/11  8:40 PM      Component Value Range   Squamous Epithelial / LPF FEW (*) RARE    WBC, UA 11-20  <3 (WBC/hpf)   RBC / HPF 0-2  <3 (RBC/hpf)   Bacteria, UA MANY (*) RARE   WET PREP, GENITAL     Status: Abnormal   Collection Time   04/08/11  9:50 PM      Component Value Range   Yeast, Wet Prep  NONE SEEN  NONE SEEN    Trich, Wet Prep NONE SEEN  NONE SEEN    Clue Cells, Wet Prep FEW (*) NONE SEEN    WBC, Wet Prep HPF POC FEW (*) NONE SEEN      Assessment and Plan  UTI: discussed with pt at length. Will tx with keflex and await culture.  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 Will tx with valtrex. Advised pt to go to HD to have full STD workup. Discussed safe sex practices. Discussed diet, activity, risks, and precautions.  Clinton Gallant. Rice III, DrHSc, MPAS, PA-C  04/08/2011, 9:48 PM   Henrietta Hoover, PA 04/08/11 2230

## 2011-04-08 NOTE — Progress Notes (Signed)
Pt c/o pain upon urination and irritation and burning in vagina states it feels like a slit in the hood.

## 2011-04-09 LAB — GC/CHLAMYDIA PROBE AMP, GENITAL
Chlamydia, DNA Probe: NEGATIVE
GC Probe Amp, Genital: NEGATIVE

## 2011-04-10 LAB — HERPES SIMPLEX VIRUS CULTURE: Culture: DETECTED

## 2011-04-10 LAB — URINE CULTURE
Colony Count: 25000
Culture  Setup Time: 201211200413

## 2011-04-11 ENCOUNTER — Telehealth (HOSPITAL_COMMUNITY): Payer: Self-pay | Admitting: Nurse Practitioner

## 2011-04-13 NOTE — ED Provider Notes (Signed)
Agree with above note.  Brittany Cordova 04/13/2011 10:36 PM

## 2011-04-13 NOTE — ED Provider Notes (Signed)
History     Chief Complaint  Patient presents with  . Abdominal Pain   HPI Telephone call to pt 2 [phone]. 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  Pt continues to be in pain.  Pt could not afford prescription for Valtrex.  Pt has an appointment with HD on Thursday.  Prescription for Acyclovir 200mg ($4 list) 2 tablets 3 times daily for 10 days then one daily RF prn called to [facility] [phone]  ROS Physical Exam   Blood pressure 143/69, pulse 81, temperature 98.1 F (36.7 C), temperature source Oral, resp. rate 16, height 5\' 3"  (1.6 m), weight 227 lb (102.967 kg), SpO2 97.00%.  Physical Exam  MAU Course  Procedures     Assessment and Plan    Jacqualyn Sedgwick 04/13/2011, 12:20 PM

## 2011-05-26 NOTE — Telephone Encounter (Signed)
Patient notified

## 2013-03-23 ENCOUNTER — Emergency Department (HOSPITAL_COMMUNITY)
Admission: EM | Admit: 2013-03-23 | Discharge: 2013-03-23 | Disposition: A | Discharge status: Home / Self Care | Payer: Self-pay | Attending: Emergency Medicine | Admitting: Emergency Medicine

## 2013-03-23 ENCOUNTER — Emergency Department (HOSPITAL_COMMUNITY): Payer: Self-pay

## 2013-03-23 ENCOUNTER — Encounter (HOSPITAL_COMMUNITY): Payer: Self-pay | Admitting: Emergency Medicine

## 2013-03-23 DIAGNOSIS — R1110 Vomiting, unspecified: Secondary | ICD-10-CM | POA: Insufficient documentation

## 2013-03-23 DIAGNOSIS — Z8742 Personal history of other diseases of the female genital tract: Secondary | ICD-10-CM | POA: Insufficient documentation

## 2013-03-23 DIAGNOSIS — J45901 Unspecified asthma with (acute) exacerbation: Secondary | ICD-10-CM | POA: Insufficient documentation

## 2013-03-23 DIAGNOSIS — Z79899 Other long term (current) drug therapy: Secondary | ICD-10-CM | POA: Insufficient documentation

## 2013-03-23 DIAGNOSIS — Z872 Personal history of diseases of the skin and subcutaneous tissue: Secondary | ICD-10-CM | POA: Insufficient documentation

## 2013-03-23 DIAGNOSIS — Z862 Personal history of diseases of the blood and blood-forming organs and certain disorders involving the immune mechanism: Secondary | ICD-10-CM | POA: Insufficient documentation

## 2013-03-23 DIAGNOSIS — J40 Bronchitis, not specified as acute or chronic: Secondary | ICD-10-CM

## 2013-03-23 DIAGNOSIS — J45909 Unspecified asthma, uncomplicated: Secondary | ICD-10-CM

## 2013-03-23 DIAGNOSIS — R000 Tachycardia, unspecified: Secondary | ICD-10-CM | POA: Insufficient documentation

## 2013-03-23 DIAGNOSIS — Z87891 Personal history of nicotine dependence: Secondary | ICD-10-CM | POA: Insufficient documentation

## 2013-03-23 MED ORDER — PREDNISONE 20 MG PO TABS
60.0000 mg | ORAL_TABLET | Freq: Once | ORAL | Status: AC
  Administered 2013-03-23: 60 mg via ORAL
  Filled 2013-03-23: qty 3

## 2013-03-23 MED ORDER — HYDROCOD POLST-CHLORPHEN POLST 10-8 MG/5ML PO LQCR: 5.0000 mL | Freq: Two times a day (BID) | ORAL | Status: DC | PRN

## 2013-03-23 MED ORDER — PREDNISONE 10 MG PO TABS: 20.0000 mg | ORAL_TABLET | Freq: Every day | ORAL | Status: DC

## 2013-03-23 MED ORDER — IPRATROPIUM BROMIDE 0.02 % IN SOLN
0.5000 mg | Freq: Once | RESPIRATORY_TRACT | Status: AC
  Administered 2013-03-23: 0.5 mg via RESPIRATORY_TRACT
  Filled 2013-03-23: qty 2.5

## 2013-03-23 MED ORDER — HYDROCOD POLST-CHLORPHEN POLST 10-8 MG/5ML PO LQCR
5.0000 mL | Freq: Two times a day (BID) | ORAL | Status: DC
  Administered 2013-03-23: 5 mL via ORAL
  Filled 2013-03-23: qty 5

## 2013-03-23 MED ORDER — ALBUTEROL SULFATE (5 MG/ML) 0.5% IN NEBU
2.5000 mg | INHALATION_SOLUTION | Freq: Once | RESPIRATORY_TRACT | Status: AC
  Administered 2013-03-23: 2.5 mg via RESPIRATORY_TRACT
  Filled 2013-03-23: qty 0.5

## 2013-03-23 NOTE — ED Provider Notes (Signed)
CSN: [id number]     Arrival date & time 03/23/13  0049 History   First MD Initiated Contact with Patient 03/23/13 0054     Chief Complaint  Patient presents with  . Cough   (Consider location/radiation/quality/duration/timing/severity/associated sxs/prior Treatment) HPI Comments: This patient with known asthma, who works in a daycare center reports, that she's had persistent, increasing coughing spell.  Since, Friday.  Tonight, to the point, where she is having post tussive emesis.  On occasion.  She, states she's been using her albuterol inhaler without relief.  Denies any fever, rhinitis, although she, states, that her nose.  Started running after she vomited several times today  Patient is a 22 y.o. female presenting with cough. The history is provided by the patient.  Cough Cough characteristics:  Non-productive and barking Severity:  Moderate Onset quality:  Gradual Duration:  3 days Timing:  Intermittent Progression:  Worsening Chronicity:  New Smoker: no   Relieved by:  Nothing Worsened by:  Nothing tried Ineffective treatments:  Beta-agonist inhaler Associated symptoms: wheezing   Associated symptoms: no chest pain, no fever, no shortness of breath and no sore throat     Past Medical History  Diagnosis Date  . Dermatitis due to food taken internally   . 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 Anemia, unspecified   . Allergic rhinitis, cause unspecified   . Amenorrhea     on BC hormone inj  . 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 Asthma   . Mass     heart   Past Surgical History  Procedure Laterality Date  . Wisdom tooth extraction     Family History  Problem Relation Age of Onset  . Allergies Father     tomatoes  . Asthma Mother   . Diabetes Other     grandmother  . Cancer Paternal Grandmother   . Cancer Maternal Uncle     [employer]  . Cancer Other     great aunts   History  Substance Use Topics  . Smoking status: Former Smoker -- 1 years    Types: Cigars     Quit date: 08/19/2010  . Smokeless tobacco: Never Used  . Alcohol Use: No   OB History   Grav Para Term Preterm Abortions TAB SAB Ect Mult Living                 Review of Systems  Constitutional: Negative for fever.  HENT: Negative for sore throat.   Respiratory: Positive for cough and wheezing. Negative for shortness of breath and stridor.   Cardiovascular: Negative for chest pain.  Gastrointestinal: Positive for vomiting. Negative for nausea.  All other systems reviewed and are negative.    Allergies  Ibuprofen  Home Medications   Current Outpatient Rx  Name  Route  Sig  Dispense  Refill  . chlorpheniramine-HYDROcodone (TUSSIONEX) 10-8 MG/5ML LQCR   Oral   Take 5 mLs by mouth every 12 (twelve) hours as needed.   120 mL   0   . lisinopril (PRINIVIL,ZESTRIL) 10 MG tablet   Oral   Take 10 mg by mouth daily.           . predniSONE (DELTASONE) 10 MG tablet   Oral   Take 2 tablets (20 mg total) by mouth daily.   14 tablet   0    BP 148/79  Pulse 103  Temp(Src) 98.7 F (37.1 C) (Oral)  Resp 18  SpO2 100% Physical Exam  Nursing note and  vitals reviewed. Constitutional: She is oriented to person, place, and time. She appears well-nourished.  HENT:  Head: Normocephalic.  Eyes: Pupils are equal, round, and reactive to light.  Neck: Normal range of motion.  Cardiovascular: Regular rhythm.  Tachycardia present.   Pulmonary/Chest: Effort normal. No respiratory distress. She has wheezes. She exhibits no tenderness.  coughing  Neurological: She is alert and oriented to person, place, and time.  Skin: Skin is warm.    ED Course  Procedures (including critical care time) Labs Review Labs Reviewed - No data to display Imaging Review Dg Chest 2 View  03/23/2013   CLINICAL DATA:  Increasing cough.  EXAM: CHEST  2 VIEW  COMPARISON:  Chest x-ray 05/30/2010.  FINDINGS: Lung volumes are normal. No consolidative airspace disease. No pleural effusions. No  pneumothorax. No pulmonary nodule or mass noted. Pulmonary vasculature and the cardiomediastinal silhouette are within normal limits.  IMPRESSION: 1.  No radiographic evidence of acute cardiopulmonary disease.   Electronically Signed   By: Trudie Reed M.D.   On: 03/23/2013 02:02    EKG Interpretation   None       MDM   1. Asthma   2. Bronchitis    Patient has been discharged him with steroids for the next 5 days.  Tussin cough medication and regular use of her albuterol inhaler    Arman Filter, NP 03/23/13 0226  Arman Filter, NP 03/23/13 [phone]

## 2013-03-23 NOTE — ED Notes (Signed)
Patient with increasing coughing spells worsening at night, c/os of having emisis  Of tonight supper.

## 2013-03-23 NOTE — ED Provider Notes (Signed)
Medical screening examination/treatment/procedure(s) were performed by non-physician practitioner and as supervising physician I was immediately available for consultation/collaboration.    Loveda Colaizzi M Leidy Massar, MD 03/23/13 0339 

## 2014-09-06 ENCOUNTER — Encounter (HOSPITAL_COMMUNITY): Payer: Self-pay | Admitting: Neurology

## 2014-09-06 ENCOUNTER — Emergency Department (HOSPITAL_COMMUNITY): Payer: No Typology Code available for payment source

## 2014-09-06 ENCOUNTER — Emergency Department (HOSPITAL_COMMUNITY)
Admission: EM | Admit: 2014-09-06 | Discharge: 2014-09-06 | Disposition: A | Discharge status: Home / Self Care | Payer: No Typology Code available for payment source | Attending: Emergency Medicine | Admitting: Emergency Medicine

## 2014-09-06 DIAGNOSIS — X58XXXA Exposure to other specified factors, initial encounter: Secondary | ICD-10-CM | POA: Insufficient documentation

## 2014-09-06 DIAGNOSIS — R2243 Localized swelling, mass and lump, lower limb, bilateral: Secondary | ICD-10-CM | POA: Diagnosis present

## 2014-09-06 DIAGNOSIS — Y9289 Other specified places as the place of occurrence of the external cause: Secondary | ICD-10-CM | POA: Insufficient documentation

## 2014-09-06 DIAGNOSIS — T783XXA Angioneurotic edema, initial encounter: Secondary | ICD-10-CM | POA: Diagnosis not present

## 2014-09-06 DIAGNOSIS — Z3202 Encounter for pregnancy test, result negative: Secondary | ICD-10-CM | POA: Insufficient documentation

## 2014-09-06 DIAGNOSIS — Y998 Other external cause status: Secondary | ICD-10-CM | POA: Diagnosis not present

## 2014-09-06 DIAGNOSIS — Z7952 Long term (current) use of systemic steroids: Secondary | ICD-10-CM | POA: Insufficient documentation

## 2014-09-06 DIAGNOSIS — Z79899 Other long term (current) drug therapy: Secondary | ICD-10-CM | POA: Diagnosis not present

## 2014-09-06 DIAGNOSIS — Y9389 Activity, other specified: Secondary | ICD-10-CM | POA: Insufficient documentation

## 2014-09-06 HISTORY — DX: Essential (primary) hypertension: I10

## 2014-09-06 LAB — CBC WITH DIFFERENTIAL/PLATELET
BASOS PCT: 0 % (ref 0–1)
Basophils Absolute: 0 10*3/uL (ref 0.0–0.1)
EOS ABS: 0.2 10*3/uL (ref 0.0–0.7)
EOS PCT: 2 % (ref 0–5)
HEMATOCRIT: 33.3 % — AB (ref 36.0–46.0)
Hemoglobin: 10.2 g/dL — ABNORMAL LOW (ref 12.0–15.0)
LYMPHS ABS: 3.8 10*3/uL (ref 0.7–4.0)
Lymphocytes Relative: 41 % (ref 12–46)
MCH: 23 pg — ABNORMAL LOW (ref 26.0–34.0)
MCHC: 30.6 g/dL (ref 30.0–36.0)
MCV: 75.2 fL — ABNORMAL LOW (ref 78.0–100.0)
MONO ABS: 0.5 10*3/uL (ref 0.1–1.0)
MONOS PCT: 6 % (ref 3–12)
NEUTROS PCT: 51 % (ref 43–77)
Neutro Abs: 4.7 10*3/uL (ref 1.7–7.7)
Platelets: 252 10*3/uL (ref 150–400)
RBC: 4.43 MIL/uL (ref 3.87–5.11)
RDW: 16.6 % — ABNORMAL HIGH (ref 11.5–15.5)
WBC: 9.2 10*3/uL (ref 4.0–10.5)

## 2014-09-06 LAB — COMPREHENSIVE METABOLIC PANEL
ALBUMIN: 3.5 g/dL (ref 3.5–5.2)
ALT: 11 U/L (ref 0–35)
AST: 22 U/L (ref 0–37)
Alkaline Phosphatase: 57 U/L (ref 39–117)
Anion gap: 7 (ref 5–15)
BUN: 11 mg/dL (ref 6–23)
CO2: 26 mmol/L (ref 19–32)
Calcium: 8.7 mg/dL (ref 8.4–10.5)
Chloride: 106 mmol/L (ref 96–112)
Creatinine, Ser: 0.83 mg/dL (ref 0.50–1.10)
GFR calc non Af Amer: >90 mL/min (ref >90)
GLUCOSE: 94 mg/dL (ref 70–99)
Potassium: 4.2 mmol/L (ref 3.5–5.1)
Sodium: 139 mmol/L (ref 135–145)
Total Bilirubin: 0.4 mg/dL (ref 0.3–1.2)
Total Protein: 6.3 g/dL (ref 6.0–8.3)

## 2014-09-06 LAB — URINE MICROSCOPIC-ADD ON

## 2014-09-06 LAB — URINALYSIS, ROUTINE W REFLEX MICROSCOPIC
Bilirubin Urine: NEGATIVE
GLUCOSE, UA: NEGATIVE mg/dL
HGB URINE DIPSTICK: NEGATIVE
Ketones, ur: NEGATIVE mg/dL
NITRITE: NEGATIVE
PROTEIN: NEGATIVE mg/dL
SPECIFIC GRAVITY, URINE: 1.029 (ref 1.005–1.030)
Urobilinogen, UA: 0.2 mg/dL (ref 0.0–1.0)
pH: 5.5 (ref 5.0–8.0)

## 2014-09-06 LAB — POC URINE PREG, ED: PREG TEST UR: NEGATIVE

## 2014-09-06 MED ORDER — FUROSEMIDE 40 MG PO TABS: 40.0000 mg | ORAL_TABLET | Freq: Every day | ORAL | Status: DC

## 2014-09-06 MED ORDER — DEXAMETHASONE SODIUM PHOSPHATE 4 MG/ML IJ SOLN
4.0000 mg | Freq: Once | INTRAMUSCULAR | Status: AC
  Administered 2014-09-06: 4 mg via INTRAVENOUS
  Filled 2014-09-06: qty 1

## 2014-09-06 MED ORDER — PREDNISONE 20 MG PO TABS: 40.0000 mg | ORAL_TABLET | Freq: Every day | ORAL | Status: DC

## 2014-09-06 MED ORDER — ONDANSETRON HCL 4 MG/2ML IJ SOLN
4.0000 mg | Freq: Once | INTRAMUSCULAR | Status: AC
  Administered 2014-09-06: 4 mg via INTRAVENOUS
  Filled 2014-09-06: qty 2

## 2014-09-06 MED ORDER — EPINEPHRINE 0.3 MG/0.3ML IJ SOAJ: 0.3000 mg | Freq: Once | INTRAMUSCULAR | Status: DC | PRN

## 2014-09-06 MED ORDER — EPINEPHRINE 0.3 MG/0.3ML IJ SOAJ
0.3000 mg | Freq: Once | INTRAMUSCULAR | Status: AC
  Administered 2014-09-06: 0.3 mg via INTRAMUSCULAR
  Filled 2014-09-06: qty 0.3

## 2014-09-06 MED ORDER — IOHEXOL 350 MG/ML SOLN
80.0000 mL | Freq: Once | INTRAVENOUS | Status: AC | PRN
  Administered 2014-09-06: 75 mL via INTRAVENOUS

## 2014-09-06 MED ORDER — FUROSEMIDE 10 MG/ML IJ SOLN
40.0000 mg | Freq: Once | INTRAMUSCULAR | Status: AC
  Administered 2014-09-06: 40 mg via INTRAVENOUS
  Filled 2014-09-06: qty 4

## 2014-09-06 MED ORDER — DIPHENHYDRAMINE HCL 50 MG/ML IJ SOLN
25.0000 mg | Freq: Once | INTRAMUSCULAR | Status: AC
  Administered 2014-09-06: 25 mg via INTRAVENOUS
  Filled 2014-09-06: qty 1

## 2014-09-06 NOTE — ED Provider Notes (Signed)
CSN: [id number]     Arrival date & time 09/06/14  0850 History   First MD Initiated Contact with Patient 09/06/14 0914     Chief Complaint  Patient presents with  . Leg Swelling   (Consider location/radiation/quality/duration/timing/severity/associated sxs/prior Treatment) HPI  Brittany Cordova is a 24 yo presenting with report of 2 days of generalized swelling.  Her mother states she began having bilateral peripheral edema in her feet 2 days ago, so much so that she had difficulty wearing her shoes to work. She states she noticed swelling appears to traveled up her thighs into her abdomen. Her family states they've noticed this morning she appeared to have swelling to her face and her tongue and a muffled voice. She is prescribed lisinopril for high blood pressure however she reports she has not taken it over a month. She also denies any new exposures to food, soap or lotions. She rates her pain as 10 out of 10 in her face, hands and feet. She denies any fevers chills chest pain, shortness of breath, abdominal pain, nausea or vomiting  Past Medical History  Diagnosis Date  . Dermatitis due to food taken internally   . 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 Anemia, unspecified   . Allergic rhinitis, cause unspecified   . Amenorrhea     on BC hormone inj  . 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 Asthma   . Mass     heart   Past Surgical History  Procedure Laterality Date  . Wisdom tooth extraction     Family History  Problem Relation Age of Onset  . Allergies Father     tomatoes  . Asthma Mother   . Diabetes Other     grandmother  . Cancer Paternal Grandmother   . Cancer Maternal Uncle     [employer]  . Cancer Other     great aunts   History  Substance Use Topics  . Smoking status: Former Smoker -- 1 years    Types: Cigars    Quit date: 08/19/2010  . Smokeless tobacco: Never Used  . Alcohol Use: No   OB History    No data available     Review of Systems   Constitutional: Negative for fever and chills.  HENT: Positive for facial swelling and voice change. Negative for sore throat.   Eyes: Negative for visual disturbance.  Respiratory: Negative for cough and shortness of breath.   Cardiovascular: Positive for leg swelling. Negative for chest pain.  Gastrointestinal: Negative for nausea, vomiting and diarrhea.  Genitourinary: Negative for dysuria.  Musculoskeletal: Negative for myalgias.  Skin: Negative for rash.  Neurological: Negative for weakness, numbness and headaches.    Allergies  Ibuprofen  Home Medications   Prior to Admission medications   Medication Sig Start Date End Date Taking? Authorizing Provider  chlorpheniramine-HYDROcodone (TUSSIONEX) 10-8 MG/5ML LQCR Take 5 mLs by mouth every 12 (twelve) hours as needed. 03/23/13   Earley Favor, NP  lisinopril (PRINIVIL,ZESTRIL) 10 MG tablet Take 10 mg by mouth daily.      Historical Provider, MD  predniSONE (DELTASONE) 10 MG tablet Take 2 tablets (20 mg total) by mouth daily. 03/23/13   Earley Favor, NP   BP 149/72 mmHg  Pulse 78  Temp(Src) 98.2 F (36.8 C) (Oral)  Resp 16  Ht  (1.6 m)  Wt 218 lb (98.884 kg)  BMI 38.63 kg/m2  SpO2 99%  LMP 08/14/2014 Physical Exam  Constitutional: She appears well-developed and well-nourished.  No distress.  HENT:  Head: Normocephalic and atraumatic.  Mouth/Throat: Posterior oropharyngeal edema present. No oropharyngeal exudate, posterior oropharyngeal erythema or tonsillar abscesses.  Oropharyngeal swelling noted, no appearance of PTA however right oropharynx more edematous than left  Eyes: Conjunctivae are normal.  Neck: Neck supple. No JVD present. No Brudzinski's sign and no Kernig's sign noted.  Cardiovascular: Normal rate, regular rhythm and intact distal pulses.   Pulses:      Dorsalis pedis pulses are 2+ on the right side, and 2+ on the left side.  2+ edema noted in bilat lower extremities  Pulmonary/Chest: Effort normal and  breath sounds normal. No stridor. No respiratory distress. She has no wheezes. She has no rales. She exhibits no tenderness.  Abdominal: Soft. There is no tenderness.  Musculoskeletal: She exhibits no tenderness.  Lymphadenopathy:    She has cervical adenopathy.  Neurological: She is alert.  Skin: Skin is warm and dry. No rash noted. She is not diaphoretic.  Psychiatric: She has a normal mood and affect.  Nursing note and vitals reviewed.   ED Course  Procedures (including critical care time) Labs Review Labs Reviewed  CBC WITH DIFFERENTIAL/PLATELET - Abnormal; Notable for the following:    Hemoglobin 10.2 (*)    HCT 33.3 (*)    MCV 75.2 (*)    MCH 23.0 (*)    RDW 16.6 (*)    All other components within normal limits  URINALYSIS, ROUTINE W REFLEX MICROSCOPIC - Abnormal; Notable for the following:    APPearance CLOUDY (*)    Leukocytes, UA SMALL (*)    All other components within normal limits  URINE MICROSCOPIC-ADD ON - Abnormal; Notable for the following:    Squamous Epithelial / LPF MANY (*)    Bacteria, UA FEW (*)    All other components within normal limits  COMPREHENSIVE METABOLIC PANEL  POC URINE PREG, ED    Imaging Review Ct Soft Tissue Neck W Contrast  09/06/2014   CLINICAL DATA:  Diffuse whole-body swelling. Onset of symptoms since yesterday. Swelling from the feet to the head.  EXAM: CT NECK WITH CONTRAST  TECHNIQUE: Multidetector CT imaging of the neck was performed using the standard protocol following the bolus administration of intravenous contrast.  CONTRAST:  75mL OMNIPAQUE IOHEXOL 350 MG/ML SOLN  COMPARISON:  None.  FINDINGS: Pharynx and larynx: Waldeyers ring appears within normal limits. No peritonsillar abscess. Artifact from hardware over the floor of the mouth. Glottis appears closed at the time of imaging. In a patient without reported stridor, this is likely physiologic. Epiglottis appears normal.  Salivary glands: Normal submandibular glands. Parotid  glands appear normal and symmetric.  Thyroid: Normal.  Lymph nodes: Bile lateral mildly enlarged level 2 lymph nodes, with the largest on the RIGHT measuring 15 mm x 17 mm. Largest on the LEFT measures 15 mm x 10 mm. Fatty hilum appears preserved bilaterally and these nodes are probably reactive.  Vascular: Carotid arteries patent. Jugular veins appear normal. The visible intracranial vasculature also appears normal.  Limited intracranial: Grossly normal.  Visualized orbits: Normal.  Mastoids and visualized paranasal sinuses: Normal.  Skeleton: Exam degraded by obese body habitus. Negative for fracture. The prevertebral soft tissues appear within normal limits.  Upper chest: Gas is present in the RIGHT internal mammary vessels, associated with IV access. There is also gas in the LEFT brachiocephalic vein.  IMPRESSION: 1. No acute abnormality. 2. Mildly enlarged right-greater-than-left level 2 lymph nodes. In this age group, these most commonly reactive lymph nodes  and associated with upper respiratory infection.   Electronically Signed   By: Andreas NewportGeoffrey  Lamke M.D.   On: 09/06/2014 13:13     EKG Interpretation None      MDM   Final diagnoses:  Angio-edema, initial encounter   24 yo with extremity and oropharyngeal swelling worsening this morning.  She is prescribed an ace inhibitor for bp but has not been taking it. Discussed case with Dr. Silverio LayYao. Her appearance is consistent with angioedema, however consider nephrotic syndrome.   CBC, CMP, UA, Upreg and CT soft tissue neck. Treated with benedryl, decadron, epi, and lasix. CT scan shows swollen lymph nodes but no other acute abnormality. Her renal function is normal and no significant abnormalities on labs. Leukocytes noted on UA however pt denies any urinary symptoms, so will not treat. Her symptoms have improved after treatment. Patient re-evaluated prior to dc, is hemodynamically stable, in no respiratory distress, and denies the feeling of throat closing.   Pt is well-appearing, in no acute distress and vital signs reviewed and not concerning. She appears safe to be discharged.  Discharge instructions include follow-up with PCP.  Return precautions provided. Pt and mother aware of plan and in agreement.  Pt is agreeable with plan & verbalizes understanding.  Filed Vitals:   09/06/14 1445 09/06/14 1515 09/06/14 1534 09/06/14 1608  BP: 146/87  143/80 138/72  Pulse:  78 76 71  Temp:      TempSrc:      Resp:  14 22 21   Height:      Weight:      SpO2:  98% 98% 98%   Meds given in ED:  Medications  dexamethasone (DECADRON) injection 4 mg (4 mg Intravenous Given 09/06/14 1023)  diphenhydrAMINE (BENADRYL) injection 25 mg (25 mg Intravenous Given 09/06/14 1019)  ondansetron (ZOFRAN) injection 4 mg (4 mg Intravenous Given 09/06/14 1101)  iohexol (OMNIPAQUE) 350 MG/ML injection 80 mL (75 mLs Intravenous Contrast Given 09/06/14 1235)  EPINEPHrine (EPI-PEN) injection 0.3 mg (0.3 mg Intramuscular Given 09/06/14 1405)  furosemide (LASIX) injection 40 mg (40 mg Intravenous Given 09/06/14 1413)    Discharge Medication List as of 09/06/2014  4:15 PM    START taking these medications   Details  EPINEPHrine (EPIPEN 2-PAK) 0.3 mg/0.3 mL IJ SOAJ injection Inject 0.3 mLs (0.3 mg total) into the muscle once as needed (for severe allergic reaction). CAll 911 immediately if you have to use this medicine, Starting 09/06/2014, Until Discontinued, Print    furosemide (LASIX) 40 MG tablet Take 1 tablet (40 mg total) by mouth daily., Starting 09/06/2014, Until Discontinued, Print    !! predniSONE (DELTASONE) 20 MG tablet Take 2 tablets (40 mg total) by mouth daily., Starting 09/06/2014, Until Discontinued, Print     !! - Potential duplicate medications found. Please discuss with provider.           Harle BattiestElizabeth Damien Batty, NP 09/08/14 [phone]  Richardean Canalavid H Yao, MD 09/09/14 1051

## 2014-09-06 NOTE — ED Notes (Signed)
Patient transported to CT 

## 2014-09-06 NOTE — ED Notes (Signed)
Pt ambulatory to bathroom with assistance from mother, tolerated well.  Bedside commode offered, pt declined at present.

## 2014-09-06 NOTE — ED Notes (Signed)
Pt reports swelling to whole body since yesterday. It started yesterday and spread up her body. Pt is a x 4. In NAD.

## 2014-09-06 NOTE — ED Notes (Addendum)
Pt ambulatory to bathroom with mother, tolerated well.

## 2014-09-06 NOTE — Discharge Instructions (Signed)
Please follow the directions provided.  Be sure to establish care with a primary care provider for further management of your high blood pressure and this angioedema.  Stop taking your lisinopril.  Don't hesitate to return for any new, worsening or concerning symptoms.   SEEK IMMEDIATE MEDICAL CARE IF:  You have severe swelling of the mouth, tongue, or lips.  You have difficulty breathing.  You have difficulty swallowing.  You faint.    Emergency Department Resource Guide 1) Find a Doctor and Pay Out of Pocket Although you won't have to find out who is covered by your insurance plan, it is a good idea to ask around and get recommendations. You will then need to call the office and see if the doctor you have chosen will accept you as a new patient and what types of options they offer for patients who are self-pay. Some doctors offer discounts or will set up payment plans for their patients who do not have insurance, but you will need to ask so you aren't surprised when you get to your appointment.  2) Contact Your Local Health Department Not all health departments have doctors that can see patients for sick visits, but many do, so it is worth a call to see if yours does. If you don't know where your local health department is, you can check in your phone book. The CDC also has a tool to help you locate your state's health department, and many state websites also have listings of all of their local health departments.  3) Find a Walk-in Clinic If your illness is not likely to be very severe or complicated, you may want to try a walk in clinic. These are popping up all over the country in pharmacies, drugstores, and shopping centers. They're usually staffed by nurse practitioners or physician assistants that have been trained to treat common illnesses and complaints. They're usually fairly quick and inexpensive. However, if you have serious medical issues or chronic medical problems, these are  probably not your best option.  No Primary Care Doctor: - Call Health Connect at  [phone] - they can help you locate a primary care doctor that  accepts your insurance, provides certain services, etc. - Physician Referral Service- [phone]  Chronic Pain Problems: Organization         Address  Phone   Notes  [facility]  [phone] Patients need to be referred by their primary care doctor.   Medication Assistance: Organization         Address  Phone   Notes  [facility]ication [facility] [address] [place], [place] [address] [phone] --Must be a resident of [facility] [place] [place] -- Must have NO insurance coverage whatsoever (no Medicaid/ Medicare, etc.) -- The pt. MUST have a primary care doctor that directs their care regularly and follows them in the community   MedAssist  [phone]   [employer]  [phone]    Agencies that provide inexpensive medical care: Organization         Address  Phone   Notes  [facility]  [phone]   [facility]    [phone]   [facility] [address] [place], [place] [address] [phone]   [facility] [address], [place] [phone]   [facility]anned Parenthood    [phone]   [facility]    [phone]  [facility] and [place]  [address], [place] Phone:  [phone], Fax:  [phone] Hours of Operation:  9 am - 6 pm, M-F.  Also accepts Medicaid/Medicare and self-pay.  [facility] for [place], [address], [place] Phone: [phone], Fax: [phone]. Hours of Operation:  8:30 am - 5:30 pm, M-F.  Also accepts Medicaid and self-pay.  [facility] [place] [address] [place] Phone: [phone]   [place], [place], [place] [phone], Ext. [phone] Mondays & Thursdays:  7-9 AM.  First 15 patients are seen on a first come, first serve basis.    [place] Providers:  Organization         Address  Phone   Notes  [facility] [address],  [phone] Also accepts self-pay patients.  [facility] [address], [place]  [phone]   [place], [address], [place] [phone]   [facility] [address] [place] [phone]   Lucianne Lei [address], [place]   [phone] Only accepts [place] Access [place] patients after they have their name applied to their card.   Self-Pay (no insurance) in [facility]:  Organization         Address  Phone   Notes  Sickle Cell Patients, [facility] [place] [phone]   [facility] [place] [phone]   [facility] [place]  [place], [place], [place] [phone]   [facility]/Dr. Osei-Bonsu  [address], [place] or [address], [place] [phone] Phone number for both [place] and [place] locations is the same.  Urgent Medical and [facility] [address], [place] [phone]   [facility] [address] [place] or [address] [phone]   [facility] [address], [place] [phone], phone; [phone], fax Sees patients 1st and 3rd Saturday of every month.  Must not qualify for public or private insurance (i.e. Medicaid, Medicare, [place] Health Choice, Veterans' Benefits)  Household income should be no more than 200% of the poverty level The clinic cannot treat you if you are pregnant or think you are pregnant  Sexually transmitted diseases are not treated at the clinic.     Dental Care: Organization         Address  Phone  Notes  [facility] [place] [phone] Accepts children up to age 46 who are enrolled in [place] or [place]; pregnant women with a Medicaid card; and children who have applied for Medicaid or [place] Health Choice, but were declined, whose parents can pay a reduced fee at time of service.  [facility] Department of [facility]  [address], [place] [phone] Accepts children up to age 58 who are enrolled in [place] or [place]; pregnant women with a Medicaid card; and children who have applied for Medicaid or [place] Health Choice, but were declined, whose parents can pay a reduced fee at time of service.  [place]  [phone]  [place] [phone] Patients are seen by appointment only. Walk-ins are not accepted. [place] will see patients 50 years of age and older. Monday - Tuesday (8am-5pm) Most Wednesdays (8:30-5pm) $30 per visit, cash only  [facility] Adult Dental Access PROGRAM  [address], [facility] [phone] Patients are seen by appointment only. Walk-ins are not accepted. [place] will see patients 104 years of age and older. One Wednesday Evening (Monthly: Volunteer Based).  $30 per visit, cash only  [place]  [phone] for adults; Children under age 13, call Graduate Pediatric Dentistry at [phone]. Children aged 62-14, please call [phone] to request a pediatric application.  Dental services are provided in all areas of dental care including fillings, crowns and bridges, complete and partial dentures, implants, gum treatment, root canals, and extractions. Preventive care is also provided. Treatment is provided to both adults and children. Patients are selected via a lottery and there is often a waiting  list.   [facility] [address] [place]  [phone] [email or web address]   [facility] [address] [place], [place] [phone], Ext. [phone] Second and Fourth Thursday of each month, opens at 6:30 AM; Clinic ends at 9 AM.  Patients are seen on a first-come first-served basis, and a limited number are seen during each clinic.   [facility]  [address] Hillard Danker [place], [place] [phone]   Eligibility Requirements You must have lived in [place], [place], or [place] counties for at least the last three months.   You cannot be eligible for state or federal sponsored [employer], including [employer], [place], or [employer].   You generally cannot be eligible for healthcare insurance through your employer.    How to apply: Eligibility screenings are held every Tuesday and Wednesday afternoon from 1:00 pm until 4:00 pm. You do not need an appointment for the interview!  [facility] [address], [place], [place]   [place]  [place] Department  [place]  [phone]    Behavioral Health Resources in the Community: Intensive Outpatient Programs Organization         Address  Phone  Notes  [place] R[address]. [address] [place], [place] [phone]   [facility] Outpatient [address] [place], [place]   ADS: Alcohol & Drug Svcs [address], [place], [place]   [place] [address],  [place], [place] or [phone]   Substance Abuse Resources Organization         Address  Phone  Notes  Alcohol and Drug Services  [phone]   [place]  [phone]   The [place]   Chinita Pester  [phone]   Residential & Outpatient Substance Abuse Program   [phone]   Psychological Services Organization         Address  Phone  Notes  [facility] [place]  [place]  [phone]   [place] [address] [place] or [phone]    Mobile Crisis Teams Organization         Address  Phone  Notes  [facility]rapeutic Alternatives, Mobile Crisis Care Unit  [phone]   [facility]ive Psychotherapeutic Services  [address] [place], [place]  [facility] [address], Ste [place] [phone]    Self-Help/Support Groups Organization         Address  Phone             Notes  [facility]ental Health Assoc[facility] of [place] - variety of support groups  [place] Call for more information  Narcotics Anonymous (NA), [facility] [address], [employer] [place]  2 meetings at this location   Special educational needs teacher         Address  Phone  Notes  [facility]AP Residential Treatment [place],    [place]  [phone]   [facility]  [address] [place] [address]016, [place], [place]   [place], [place] [phone] Admissions: 8am-3pm M-F  Incentives Substance [place] [address],    [place], [place] [phone]   [facility] [address] [place], [place], [place]   [facility] [address]  [place], [place]   [facility] Programs - Intensive Outpatient [address]een [address], [place], [place]   [facility] ([place].,  [place], [place] [phone] or [phone]   [facility] Services (RTS) [address], [place], [place] Accepts Medicaid  [facility] [place] [address]  [place] [phone] Substance Abuse/Addiction Treatment   [facility] Organization          Address  Phone  Notes  [facility]  [phone]   Domenic Schwab, PhD [address] Arlis [place], [place]   2818698[address] or [phone]   [place], [place] [phone]   [facility] [address] [place], [place] [phone] Insurance/Medicaid/sponsorship through [facility] and Families [address] [place]                                    [place], [place] [phone] [place] [address], [place] [phone]    Dr. Adele Schilder  [phone]   [facility] Dept. 1) [address] [place] 2) [place] 3)  [place] [address], [place] [phone]  [phone]   [place] [phone] or [phone] (After Hours)

## 2014-10-14 ENCOUNTER — Encounter (HOSPITAL_COMMUNITY): Payer: Self-pay | Admitting: *Deleted

## 2014-10-14 ENCOUNTER — Emergency Department (HOSPITAL_COMMUNITY)
Admission: EM | Admit: 2014-10-14 | Discharge: 2014-10-14 | Disposition: A | Discharge status: Home / Self Care | Payer: No Typology Code available for payment source | Attending: Emergency Medicine | Admitting: Emergency Medicine

## 2014-10-14 DIAGNOSIS — K0889 Other specified disorders of teeth and supporting structures: Secondary | ICD-10-CM

## 2014-10-14 DIAGNOSIS — I10 Essential (primary) hypertension: Secondary | ICD-10-CM | POA: Insufficient documentation

## 2014-10-14 DIAGNOSIS — Z7952 Long term (current) use of systemic steroids: Secondary | ICD-10-CM | POA: Insufficient documentation

## 2014-10-14 DIAGNOSIS — F419 Anxiety disorder, unspecified: Secondary | ICD-10-CM | POA: Insufficient documentation

## 2014-10-14 DIAGNOSIS — Z872 Personal history of diseases of the skin and subcutaneous tissue: Secondary | ICD-10-CM | POA: Insufficient documentation

## 2014-10-14 DIAGNOSIS — Z87891 Personal history of nicotine dependence: Secondary | ICD-10-CM | POA: Insufficient documentation

## 2014-10-14 DIAGNOSIS — Z862 Personal history of diseases of the blood and blood-forming organs and certain disorders involving the immune mechanism: Secondary | ICD-10-CM | POA: Insufficient documentation

## 2014-10-14 DIAGNOSIS — Z8742 Personal history of other diseases of the female genital tract: Secondary | ICD-10-CM | POA: Insufficient documentation

## 2014-10-14 DIAGNOSIS — K088 Other specified disorders of teeth and supporting structures: Secondary | ICD-10-CM | POA: Insufficient documentation

## 2014-10-14 DIAGNOSIS — J45909 Unspecified asthma, uncomplicated: Secondary | ICD-10-CM | POA: Insufficient documentation

## 2014-10-14 DIAGNOSIS — Z79899 Other long term (current) drug therapy: Secondary | ICD-10-CM | POA: Insufficient documentation

## 2014-10-14 MED ORDER — DIPHENHYDRAMINE HCL 50 MG/ML IJ SOLN
50.0000 mg | Freq: Once | INTRAMUSCULAR | Status: AC
  Administered 2014-10-14: 50 mg via INTRAMUSCULAR

## 2014-10-14 MED ORDER — HYDROCODONE-ACETAMINOPHEN 5-325 MG PO TABS: 1.0000 | ORAL_TABLET | ORAL | Status: DC | PRN

## 2014-10-14 MED ORDER — DIPHENHYDRAMINE HCL 50 MG/ML IJ SOLN
INTRAMUSCULAR | Status: AC
  Filled 2014-10-14: qty 1

## 2014-10-14 MED ORDER — LORAZEPAM 0.5 MG PO TABS
1.0000 mg | ORAL_TABLET | Freq: Once | ORAL | Status: AC
  Administered 2014-10-14: 1 mg via ORAL
  Filled 2014-10-14: qty 2

## 2014-10-14 MED ORDER — AMOXICILLIN 500 MG PO CAPS: 500.0000 mg | ORAL_CAPSULE | Freq: Three times a day (TID) | ORAL | Status: DC

## 2014-10-14 NOTE — ED Notes (Signed)
Patient crying with pain to the right back tooth.  Patient not SOB but is crying and sobing

## 2014-10-14 NOTE — Discharge Instructions (Signed)
Read the information below.  Use the prescribed medication as directed.  Please discuss all new medications with your pharmacist.  Do not take additional tylenol while taking the prescribed pain medication to avoid overdose.  You may return to the Emergency Department at any time for worsening condition or any new symptoms that concern you.   If there is any possibility that you might be pregnant, please let your health care provider know and discuss this with the pharmacist to ensure medication safety.   Please call your dentist to schedule a close follow up appointment.  If you develop fevers, swelling in your face, difficulty swallowing or breathing, return to the ER immediately for a recheck.    Dental Pain A tooth ache may be caused by cavities (tooth decay). Cavities expose the nerve of the tooth to air and hot or cold temperatures. It may come from an infection or abscess (also called a boil or furuncle) around your tooth. It is also often caused by dental caries (tooth decay). This causes the pain you are having. DIAGNOSIS  Your caregiver can diagnose this problem by exam. TREATMENT   If caused by an infection, it may be treated with medications which kill germs (antibiotics) and pain medications as prescribed by your caregiver. Take medications as directed.  Only take over-the-counter or prescription medicines for pain, discomfort, or fever as directed by your caregiver.  Whether the tooth ache today is caused by infection or dental disease, you should see your dentist as soon as possible for further care. SEEK MEDICAL CARE IF: The exam and treatment you received today has been provided on an emergency basis only. This is not a substitute for complete medical or dental care. If your problem worsens or new problems (symptoms) appear, and you are unable to meet with your dentist, call or return to this location. SEEK IMMEDIATE MEDICAL CARE IF:   You have a fever.  You develop redness and  swelling of your face, jaw, or neck.  You are unable to open your mouth.  You have severe pain uncontrolled by pain medicine. MAKE SURE YOU:   Understand these instructions.  Will watch your condition.  Will get help right away if you are not doing well or get worse. Document Released: 05/06/2005 Document Revised: 07/29/2011 Document Reviewed: 12/23/2007 [facility] Patient Information 2015 [place]. This information is not intended to replace advice given to you by your health care provider. Make sure you discuss any questions you have with your health care provider.

## 2014-10-14 NOTE — ED Notes (Signed)
Pt very tearful during assessment, hyperventilating stating "it hurts." Trixie DredgeEmily West, PA at bedside and verbal order for ativan given.

## 2014-10-14 NOTE — ED Provider Notes (Signed)
CSN: [id number]     Arrival date & time 10/14/14  1905 History  This chart was scribed for Trixie DredgeEmily Kairos Panetta, PA-C working with Geoffery Lyonsouglas Delo, MD by Lyndel SafeKaitlyn Shelton, ED Scribe. This patient was seen in room TR10C/TR10C and the patient's care was started at 7:34 PM.    Chief Complaint  Patient presents with  . Dental Pain   The history is provided by the patient. No language interpreter was used.   HPI Comments: Brittany Cordova is a 24 y.o. female who presents to the Emergency Department complaining of progressively worsening, constant lower right tooth pain that started two weeks ago but became worse today. Pt reports facial swelling onset today as an associated symptom.She states pain becomes worse with eating. Pt reports that [facility] for a dental abscess and missing filling. She was given amoxicillin which she finished this morning. Per mother, patient has been rinsing her mouth out with salt water and has been taking tylenol #3 with no relief. Pt denies fevers, chills, sore throat, difficulty swallowing or breathing.  Past Medical History  Diagnosis Date  . Dermatitis due to food taken internally   . 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 Anemia, unspecified   . Allergic rhinitis, cause unspecified   . Amenorrhea     on BC hormone inj  . 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 Asthma   . Mass     heart  . Hypertension    Past Surgical History  Procedure Laterality Date  . Wisdom tooth extraction     Family History  Problem Relation Age of Onset  . Allergies Father     tomatoes  . Asthma Mother   . Diabetes Other     grandmother  . Cancer Paternal Grandmother   . Cancer Maternal Uncle     [employer] Uncle  . Cancer Other     great aunts   History  Substance Use Topics  . Smoking status: Former Smoker -- 1 years    Types: Cigars    Quit date: 08/19/2010  . Smokeless tobacco: Never Used  . Alcohol Use: No   OB History    No data available      Review of Systems  Constitutional: Negative for fever and chills.  HENT: Positive for dental problem and facial swelling. Negative for sore throat and trouble swallowing.   Respiratory: Negative for shortness of breath.   Gastrointestinal: Negative for nausea and vomiting.  Musculoskeletal: Negative for neck pain and neck stiffness.  Skin: Negative for color change and rash.  Allergic/Immunologic: Negative for immunocompromised state.  Hematological: Does not bruise/bleed easily.  Psychiatric/Behavioral: Negative for self-injury.  All other systems reviewed and are negative.     Allergies  Ibuprofen  Home Medications   Prior to Admission medications   Medication Sig Start Date End Date Taking? Authorizing Provider  albuterol (PROVENTIL HFA;VENTOLIN HFA) 108 (90 BASE) MCG/ACT inhaler Inhale into the lungs every 6 (six) hours as needed for wheezing or shortness of breath.    Historical Provider, MD  chlorpheniramine-HYDROcodone (TUSSIONEX) 10-8 MG/5ML LQCR Take 5 mLs by mouth every 12 (twelve) hours as needed. Patient not taking: Reported on 09/06/2014 03/23/13   Earley FavorGail Schulz, NP  EPINEPHrine (EPIPEN 2-PAK) 0.3 mg/0.3 mL IJ SOAJ injection Inject 0.3 mLs (0.3 mg total) into the muscle once as needed (for severe allergic reaction). CAll 911 immediately if you have to use this medicine 09/06/14   Harle BattiestElizabeth Tysinger, NP  furosemide (LASIX) 40 MG tablet Take 1 tablet (40 mg total) by mouth daily. 09/06/14   Harle Battiest, NP  predniSONE (DELTASONE) 10 MG tablet Take 2 tablets (20 mg total) by mouth daily. Patient not taking: Reported on 09/06/2014 03/23/13   Earley Favor, NP  predniSONE (DELTASONE) 20 MG tablet Take 2 tablets (40 mg total) by mouth daily. 09/06/14   Harle Battiest, NP   BP 143/96 mmHg  Pulse 108  Temp(Src) 99.2 F (37.3 C) (Oral)  Resp 24  SpO2 100%   Physical Exam  Constitutional: She appears well-developed and well-nourished. No distress.  Anxious crying and  hyperventilating.   HENT:  Head: Normocephalic and atraumatic.  Mouth/Throat: Uvula is midline and oropharynx is clear and moist. Mucous membranes are not dry. No uvula swelling. No oropharyngeal exudate, posterior oropharyngeal edema, posterior oropharyngeal erythema or tonsillar abscesses.  right lower second molar ttp Right sided facial tenderness, but no noted submandibular edema or anterior neck edema No focal tenderness No overlying erythema  Neck: Trachea normal, normal range of motion and phonation normal. Neck supple. No tracheal tenderness present. No rigidity. No tracheal deviation, no edema, no erythema and normal range of motion present.  Cardiovascular: Normal rate.   Pulmonary/Chest: Effort normal and breath sounds normal. No stridor.  Loud transmitted upper resp sounds  Lymphadenopathy:    She has no cervical adenopathy.  Neurological: She is alert.  Skin: She is not diaphoretic.  Nursing note and vitals reviewed.   ED Course  Dental Date/Time: 10/14/2014 8:57 PM Performed by: Trixie Dredge Authorized by: Trixie Dredge Consent: Verbal consent obtained. Consent given by: patient Patient identity confirmed: verbally with patient Local anesthesia used: yes Anesthesia: nerve block Local anesthetic: bupivacaine 0.5% without epinephrine Patient sedated: no Patient tolerance: Patient tolerated the procedure well with no immediate complications Comments: Dental block, right lower dentition - inferior alveolar nerve block   (including critical care time) DIAGNOSTIC STUDIES: Oxygen Saturation is 100% on RA, normal by my interpretation.    COORDINATION OF CARE:  7:51 PM Discussed treatment plan which includes dental block with patient. Patient acknowledged and agreed to plan.   Labs Review Labs Reviewed - No data to display  Imaging Review No results found.   EKG Interpretation None       8:23 PM Pt reports itching following ativan.  IM benadryl ordered.  No rash,  no airway concerns.    Prior to discharge pt feeling much better.  No further allergic-type symptoms.  No airway concerns.  Feeling better with dental block.    MDM   Final diagnoses:  Pain, dental    Afebrile, nontoxic patient with exacerbation of ongoing dental pain.  No obvious abscess.  Right lower second molar tender to percussion.  She has been seeing a dentist about this, just finished her antibiotics, pain uncontrolled with tylenol #3.  Doubt deep space head or neck infection.  Doubt Ludwig's angina.  Dental block performed in ED.  D/C home with antibiotic, pain medication and dental follow up.  Discussed findings, treatment, and follow up  with patient.  Pt given return precautions.  Pt verbalizes understanding and agrees with plan.       I personally performed the services described in this documentation, which was scribed in my presence. The recorded information has been reviewed and is accurate.    Trixie Dredge, PA-C 10/14/14 2127  Geoffery Lyons, MD 10/15/14 [phone]

## 2015-01-20 ENCOUNTER — Encounter (HOSPITAL_COMMUNITY): Payer: Self-pay | Admitting: *Deleted

## 2015-01-20 ENCOUNTER — Inpatient Hospital Stay (HOSPITAL_COMMUNITY)
Admission: AD | Admit: 2015-01-20 | Discharge: 2015-01-21 | Disposition: A | Discharge status: Home / Self Care | Payer: Self-pay | Source: Ambulatory Visit | Attending: Obstetrics and Gynecology | Admitting: Obstetrics and Gynecology

## 2015-01-20 ENCOUNTER — Inpatient Hospital Stay (HOSPITAL_COMMUNITY): Payer: No Typology Code available for payment source

## 2015-01-20 DIAGNOSIS — Z3202 Encounter for pregnancy test, result negative: Secondary | ICD-10-CM | POA: Insufficient documentation

## 2015-01-20 DIAGNOSIS — R1031 Right lower quadrant pain: Secondary | ICD-10-CM | POA: Insufficient documentation

## 2015-01-20 DIAGNOSIS — I10 Essential (primary) hypertension: Secondary | ICD-10-CM | POA: Insufficient documentation

## 2015-01-20 DIAGNOSIS — Z87891 Personal history of nicotine dependence: Secondary | ICD-10-CM | POA: Insufficient documentation

## 2015-01-20 LAB — COMPREHENSIVE METABOLIC PANEL
ALT: 12 U/L — ABNORMAL LOW (ref 14–54)
ANION GAP: 7 (ref 5–15)
AST: 21 U/L (ref 15–41)
Albumin: 4 g/dL (ref 3.5–5.0)
Alkaline Phosphatase: 66 U/L (ref 38–126)
BUN: 11 mg/dL (ref 6–20)
CO2: 27 mmol/L (ref 22–32)
Calcium: 9.1 mg/dL (ref 8.9–10.3)
Chloride: 105 mmol/L (ref 101–111)
Creatinine, Ser: 0.74 mg/dL (ref 0.44–1.00)
GFR calc non Af Amer: >60 mL/min (ref >60)
Glucose, Bld: 100 mg/dL — ABNORMAL HIGH (ref 65–99)
Potassium: 3.7 mmol/L (ref 3.5–5.1)
Sodium: 139 mmol/L (ref 135–145)
TOTAL PROTEIN: 6.9 g/dL (ref 6.5–8.1)

## 2015-01-20 LAB — CBC WITH DIFFERENTIAL/PLATELET
BASOS ABS: 0 10*3/uL (ref 0.0–0.1)
BASOS PCT: 0 % (ref 0–1)
EOS ABS: 0.1 10*3/uL (ref 0.0–0.7)
Eosinophils Relative: 1 % (ref 0–5)
HEMATOCRIT: 33.6 % — AB (ref 36.0–46.0)
HEMOGLOBIN: 10.6 g/dL — AB (ref 12.0–15.0)
Lymphocytes Relative: 38 % (ref 12–46)
Lymphs Abs: 4.3 10*3/uL — ABNORMAL HIGH (ref 0.7–4.0)
MCH: 23.2 pg — ABNORMAL LOW (ref 26.0–34.0)
MCHC: 31.5 g/dL (ref 30.0–36.0)
MCV: 73.5 fL — AB (ref 78.0–100.0)
Monocytes Absolute: 0.5 10*3/uL (ref 0.1–1.0)
Monocytes Relative: 4 % (ref 3–12)
Neutro Abs: 6.5 10*3/uL (ref 1.7–7.7)
Neutrophils Relative %: 57 % (ref 43–77)
Platelets: 278 10*3/uL (ref 150–400)
RBC: 4.57 MIL/uL (ref 3.87–5.11)
RDW: 16.6 % — AB (ref 11.5–15.5)
WBC: 11.4 10*3/uL — AB (ref 4.0–10.5)

## 2015-01-20 LAB — URINALYSIS, ROUTINE W REFLEX MICROSCOPIC
Bilirubin Urine: NEGATIVE
Glucose, UA: NEGATIVE mg/dL
Hgb urine dipstick: NEGATIVE
KETONES UR: NEGATIVE mg/dL
LEUKOCYTES UA: NEGATIVE
NITRITE: NEGATIVE
PROTEIN: NEGATIVE mg/dL
Specific Gravity, Urine: >1.03 — ABNORMAL HIGH (ref 1.005–1.030)
Urobilinogen, UA: 0.2 mg/dL (ref 0.0–1.0)
pH: 6 (ref 5.0–8.0)

## 2015-01-20 LAB — POCT PREGNANCY, URINE: PREG TEST UR: NEGATIVE

## 2015-01-20 MED ORDER — HYDROMORPHONE HCL 1 MG/ML IJ SOLN
1.0000 mg | Freq: Once | INTRAMUSCULAR | Status: AC
  Administered 2015-01-20: 1 mg via INTRAMUSCULAR
  Filled 2015-01-20: qty 1

## 2015-01-20 NOTE — MAU Note (Signed)
Pt reports she has been having lower pelvic pain x 2 days that has progressively gotten worse.. Denies vag discharge or bleeding.

## 2015-01-20 NOTE — MAU Provider Note (Signed)
History     CSN: [id number]  Arrival date and time: 01/20/15 2206   First Provider Initiated Contact with Patient 01/20/15 2247      Chief Complaint  Patient presents with  . Pelvic Pain   HPI  Brittany Cordova is a 24 y.o. [id number] who presents to MAU today with complaint of RLQ abdominal pain x 2 days. She states pain has been constant. She rates pain at 8/10 now. She has not taken anything for pain. She denies vaginal bleeding, discharge, UTI symptoms or fever. She has had nausea without vomiting, diarrhea or constipation. She states LMP 01/02/15. She is sexually active and uses condoms.    OB History    Gravida Para Term Preterm AB TAB SAB Ectopic Multiple Living        Past Medical History  Diagnosis Date  . Dermatitis due to food taken internally   . 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 Anemia, unspecified   . Allergic rhinitis, cause unspecified   . Amenorrhea     on BC hormone inj  . 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 Asthma   . Mass     heart  . Hypertension     Past Surgical History  Procedure Laterality Date  . Wisdom tooth extraction      Family History  Problem Relation Age of Onset  . Allergies Father     tomatoes  . Asthma Mother   . Diabetes Other     grandmother  . Cancer Paternal Grandmother   . Cancer Maternal Uncle     [employer]  . Cancer Other     great aunts    Social History  Substance Use Topics  . Smoking status: Former Smoker -- 1 years    Types: Cigars    Quit date: 08/19/2010  . Smokeless tobacco: Never Used  . Alcohol Use: No    Allergies:  Allergies  Allergen Reactions  . Ibuprofen Anaphylaxis and Swelling    Prescriptions prior to admission  Medication Sig Dispense Refill Last Dose  . albuterol (PROVENTIL HFA;VENTOLIN HFA) 108 (90 BASE) MCG/ACT inhaler Inhale into the lungs every 6 (six) hours as needed for wheezing or shortness of breath.   Past Week at Unknown time  . amoxicillin  (AMOXIL) 500 MG capsule Take 1 capsule (500 mg total) by mouth 3 (three) times daily. 21 capsule 0   . chlorpheniramine-HYDROcodone (TUSSIONEX) 10-8 MG/5ML LQCR Take 5 mLs by mouth every 12 (twelve) hours as needed. (Patient not taking: Reported on 09/06/2014) 120 mL 0 Not Taking at Unknown time  . EPINEPHrine (EPIPEN 2-PAK) 0.3 mg/0.3 mL IJ SOAJ injection Inject 0.3 mLs (0.3 mg total) into the muscle once as needed (for severe allergic reaction). CAll 911 immediately if you have to use this medicine 2 Device 1   . furosemide (LASIX) 40 MG tablet Take 1 tablet (40 mg total) by mouth daily. 2 tablet 0   . HYDROcodone-acetaminophen (NORCO/VICODIN) 5-325 MG per tablet Take 1-2 tablets by mouth every 4 (four) hours as needed for moderate pain or severe pain. 15 tablet 0   . predniSONE (DELTASONE) 10 MG tablet Take 2 tablets (20 mg total) by mouth daily. (Patient not taking: Reported on 09/06/2014) 14 tablet 0 Not Taking at Unknown time  . predniSONE (DELTASONE) 20 MG tablet Take 2 tablets (40 mg total) by mouth daily. 10 tablet 0     Review of Systems  Constitutional: Negative for fever and malaise/fatigue.  Gastrointestinal: Positive for abdominal pain. Negative for nausea, vomiting, diarrhea and constipation.  Genitourinary: Negative for dysuria, urgency and frequency.       Neg - vaginal bleeding, discharge   Physical Exam   Blood pressure 146/80, pulse 84, temperature 98.3 F (36.8 C), resp. rate 18, height 5\' 3"  (1.6 m), weight 251 lb 3.2 oz (113.944 kg), last menstrual period 01/02/2015.  Physical Exam  Nursing note and vitals reviewed. Constitutional: She is oriented to person, place, and time. She appears well-developed and well-nourished. No distress.  HENT:  Head: Normocephalic and atraumatic.  Cardiovascular: Normal rate.   Respiratory: Effort normal.  GI: Soft. She exhibits no distension and no mass. There is tenderness. There is guarding. There is no rebound (moderate RLQ abdominal  tenderness to palpation).  Neurological: She is alert and oriented to person, place, and time.  Skin: Skin is warm and dry. No erythema.  Psychiatric: She has a normal mood and affect.   Results for orders placed or performed during the hospital encounter of 01/20/15 (from the past 24 hour(s))  Urinalysis, Routine w reflex microscopic (not at [facility]     Status: Abnormal   Collection Time: 01/20/15 10:10 PM  Result Value Ref Range   Color, Urine YELLOW YELLOW   APPearance CLEAR CLEAR   Specific Gravity, Urine >1.030 (H) 1.005 - 1.030   pH 6.0 5.0 - 8.0   Glucose, UA NEGATIVE NEGATIVE mg/dL   Hgb urine dipstick NEGATIVE NEGATIVE   Bilirubin Urine NEGATIVE NEGATIVE   Ketones, ur NEGATIVE NEGATIVE mg/dL   Protein, ur NEGATIVE NEGATIVE mg/dL   Urobilinogen, UA 0.2 0.0 - 1.0 mg/dL   Nitrite NEGATIVE NEGATIVE   Leukocytes, UA NEGATIVE NEGATIVE  Pregnancy, urine POC     Status: None   Collection Time: 01/20/15 10:24 PM  Result Value Ref Range   Preg Test, Ur NEGATIVE NEGATIVE  CBC with Differential/Platelet     Status: Abnormal   Collection Time: 01/20/15 11:00 PM  Result Value Ref Range   WBC 11.4 (H) 4.0 - 10.5 K/uL   RBC 4.57 3.87 - 5.11 MIL/uL   Hemoglobin 10.6 (L) 12.0 - 15.0 g/dL   HCT 40.9 (L) 81.1 - 91.4 %   MCV 73.5 (L) 78.0 - 100.0 fL   MCH 23.2 (L) 26.0 - 34.0 pg   MCHC 31.5 30.0 - 36.0 g/dL   RDW 78.2 (H) 95.6 - 21.3 %   Platelets 278 150 - 400 K/uL   Neutrophils Relative % 57 43 - 77 %   Neutro Abs 6.5 1.7 - 7.7 K/uL   Lymphocytes Relative 38 12 - 46 %   Lymphs Abs 4.3 (H) 0.7 - 4.0 K/uL   Monocytes Relative 4 3 - 12 %   Monocytes Absolute 0.5 0.1 - 1.0 K/uL   Eosinophils Relative 1 0 - 5 %   Eosinophils Absolute 0.1 0.0 - 0.7 K/uL   Basophils Relative 0 0 - 1 %   Basophils Absolute 0.0 0.0 - 0.1 K/uL  Comprehensive metabolic panel     Status: Abnormal   Collection Time: 01/20/15 11:00 PM  Result Value Ref Range   Sodium 139 135 - 145 mmol/L   Potassium 3.7 3.5  - 5.1 mmol/L   Chloride 105 101 - 111 mmol/L   CO2 27 22 - 32 mmol/L   Glucose, Bld 100 (H) 65 - 99 mg/dL   BUN 11 6 - 20 mg/dL   Creatinine, Ser 0.86 0.44 -  1.00 mg/dL   Calcium 9.1 8.9 - 16.1 mg/dL   Total Protein 6.9 6.5 - 8.1 g/dL   Albumin 4.0 3.5 - 5.0 g/dL   AST 21 15 - 41 U/L   ALT 12 (L) 14 - 54 U/L   Alkaline Phosphatase 66 38 - 126 U/L   Total Bilirubin <0.1 (L) 0.3 - 1.2 mg/dL   GFR calc non Af Amer >60 >60 mL/min   GFR calc Af Amer >60 >60 mL/min   Anion gap 7 5 - 15   US Transvaginal Non-ob  01/21/2015   CLINICAL DATA:  Right lower quadrant abdominal pain  EXAM: TRANSABDOMINAL AND TRANSVAGINAL ULTRASOUND OF PELVIS  TECHNIQUE: Both transabdominal and transvaginal ultrasound examinations of the pelvis were performed. Transabdominal technique was performed for global imaging of the pelvis including uterus, ovaries, adnexal regions, and pelvic cul-de-sac. It was necessary to proceed with endovaginal exam following the transabdominal exam to visualize the ovaries.  COMPARISON:  None  FINDINGS: Uterus  Measurements: 7.8 x 3.4 x 4.8 cm. No fibroids or other mass visualized.  Endometrium  Thickness: 11.6 mm.  No focal abnormality visualized.  Right ovary  Measurements: 4.2 x 2.6 x 3.3 cm. Benign 2.6 cm cyst or follicle. No complex or solid lesions.  Left ovary  Measurements: 3.2 x 2.1 x 2.1 cm. Normal appearance/no adnexal mass.  Other findings  No free fluid.  IMPRESSION: Normal uterus and ovaries.   Electronically Signed   By: Ellery Plunk M.D.   On: 01/21/2015 00:58   US Pelvis Complete  01/21/2015   CLINICAL DATA:  Right lower quadrant abdominal pain  EXAM: TRANSABDOMINAL AND TRANSVAGINAL ULTRASOUND OF PELVIS  TECHNIQUE: Both transabdominal and transvaginal ultrasound examinations of the pelvis were performed. Transabdominal technique was performed for global imaging of the pelvis including uterus, ovaries, adnexal regions, and pelvic cul-de-sac. It was necessary to proceed with  endovaginal exam following the transabdominal exam to visualize the ovaries.  COMPARISON:  None  FINDINGS: Uterus  Measurements: 7.8 x 3.4 x 4.8 cm. No fibroids or other mass visualized.  Endometrium  Thickness: 11.6 mm.  No focal abnormality visualized.  Right ovary  Measurements: 4.2 x 2.6 x 3.3 cm. Benign 2.6 cm cyst or follicle. No complex or solid lesions.  Left ovary  Measurements: 3.2 x 2.1 x 2.1 cm. Normal appearance/no adnexal mass.  Other findings  No free fluid.  IMPRESSION: Normal uterus and ovaries.   Electronically Signed   By: Ellery Plunk M.D.   On: 01/21/2015 00:58    MAU Course  Procedures None  MDM UPT - negative UA, HIV, RPR, CBC, CMP and [place] today US shows no evidence of GYN abnormality as source of pain. Due to location and severity of pain will transfer patient to [facility] for [facility] scan Discussed with Dr. Lynelle Doctor at [facility]. He will accept transfer by private vehicle. Patient declined EMS transfer. Her friend will drive her.  Assessment and Plan  A: RLQ abdominal pain  P: Discharge patient to go directly to [facility] for further evaluation   Marny Lowenstein, PA-C  01/21/2015, 1:16 AM

## 2015-01-21 ENCOUNTER — Emergency Department (HOSPITAL_COMMUNITY): Payer: No Typology Code available for payment source

## 2015-01-21 ENCOUNTER — Emergency Department (HOSPITAL_COMMUNITY)
Admission: EM | Admit: 2015-01-21 | Discharge: 2015-01-21 | Disposition: A | Discharge status: Home / Self Care | Payer: No Typology Code available for payment source | Attending: Emergency Medicine | Admitting: Emergency Medicine

## 2015-01-21 ENCOUNTER — Encounter (HOSPITAL_COMMUNITY): Payer: Self-pay | Admitting: Emergency Medicine

## 2015-01-21 DIAGNOSIS — Z79899 Other long term (current) drug therapy: Secondary | ICD-10-CM | POA: Insufficient documentation

## 2015-01-21 DIAGNOSIS — R1031 Right lower quadrant pain: Secondary | ICD-10-CM

## 2015-01-21 DIAGNOSIS — Z87891 Personal history of nicotine dependence: Secondary | ICD-10-CM | POA: Insufficient documentation

## 2015-01-21 DIAGNOSIS — I10 Essential (primary) hypertension: Secondary | ICD-10-CM | POA: Insufficient documentation

## 2015-01-21 DIAGNOSIS — R630 Anorexia: Secondary | ICD-10-CM | POA: Insufficient documentation

## 2015-01-21 DIAGNOSIS — Z872 Personal history of diseases of the skin and subcutaneous tissue: Secondary | ICD-10-CM | POA: Insufficient documentation

## 2015-01-21 DIAGNOSIS — N83201 Unspecified ovarian cyst, right side: Secondary | ICD-10-CM

## 2015-01-21 DIAGNOSIS — J45909 Unspecified asthma, uncomplicated: Secondary | ICD-10-CM | POA: Insufficient documentation

## 2015-01-21 DIAGNOSIS — N8320 Unspecified ovarian cysts: Secondary | ICD-10-CM | POA: Insufficient documentation

## 2015-01-21 DIAGNOSIS — Z862 Personal history of diseases of the blood and blood-forming organs and certain disorders involving the immune mechanism: Secondary | ICD-10-CM | POA: Insufficient documentation

## 2015-01-21 LAB — HIV ANTIBODY (ROUTINE TESTING W REFLEX): HIV SCREEN 4TH GENERATION: NONREACTIVE

## 2015-01-21 LAB — RPR: RPR Ser Ql: NONREACTIVE

## 2015-01-21 MED ORDER — SODIUM CHLORIDE 0.9 % IV SOLN
Freq: Once | INTRAVENOUS | Status: AC
  Administered 2015-01-21: 03:00:00 via INTRAVENOUS

## 2015-01-21 MED ORDER — HYDROCODONE-ACETAMINOPHEN 5-325 MG PO TABS
1.0000 | ORAL_TABLET | Freq: Once | ORAL | Status: AC
  Administered 2015-01-21: 1 via ORAL
  Filled 2015-01-21: qty 1

## 2015-01-21 MED ORDER — IOHEXOL 300 MG/ML  SOLN
50.0000 mL | Freq: Once | INTRAMUSCULAR | Status: AC | PRN
  Administered 2015-01-21: 50 mL via ORAL

## 2015-01-21 MED ORDER — HYDROCODONE-ACETAMINOPHEN 5-325 MG PO TABS: 1.0000 | ORAL_TABLET | Freq: Four times a day (QID) | ORAL | Status: DC | PRN

## 2015-01-21 MED ORDER — IOHEXOL 300 MG/ML  SOLN
100.0000 mL | Freq: Once | INTRAMUSCULAR | Status: AC | PRN
  Administered 2015-01-21: 100 mL via INTRAVENOUS

## 2015-01-21 NOTE — ED Provider Notes (Signed)
CSN: [id number]     Arrival date & time 01/21/15  0205 History   First MD Initiated Contact with Patient 01/21/15 [phone]     Chief Complaint  Patient presents with  . Abdominal Pain     (Consider location/radiation/quality/duration/timing/severity/associated sxs/prior Treatment) HPI Comments: This is a 24 year old morbidly obese African-American female who was seen earlier today at [facility] for 2 days of right lower quadrant pain.  She had pelvic exam, urine blood work.  Transvaginal ultrasound, which was inconclusive.  She was sent to the emergency room for further evaluation and possible CT scan. Patient states that she's had gradual onset of right lower quadrant pain.  That's been consistent and constant, worse with movement, palpation.  She has decreased appetite.  Denies any dysuria, vaginal discharge, constipation or diarrhea or abdominal trauma. She has not taken any home medication for discomfort.  She states she's been lying in the bed, crying about the pain. She does not have any history of abdominal surgeries negative.  GYN history no abnormal cells on Pap smears.  No history of STD.  She does not have an IUD in place  Patient is a 24 y.o. female presenting with abdominal pain. The history is provided by the patient.  Abdominal Pain Pain location:  RLQ Pain quality: stabbing   Pain radiates to:  Does not radiate Pain severity:  Severe Onset quality:  Gradual Duration:  2 days Timing:  Constant Progression:  Worsening Chronicity:  New Context: not awakening from sleep, not diet changes, not eating, not laxative use, not previous surgeries, not recent illness, not recent sexual activity, not recent travel, not retching, not sick contacts, not suspicious food intake and not trauma   Relieved by:  None tried Worsened by:  Palpation, position changes, movement and deep breathing Ineffective treatments:  None tried Associated symptoms: anorexia   Associated symptoms: no chest  pain, no constipation, no cough, no diarrhea, no dysuria, no fever, no flatus, no hematuria, no nausea, no shortness of breath, no vaginal bleeding, no vaginal discharge and no vomiting   Risk factors: obesity   Risk factors: not pregnant and no recent hospitalization     Past Medical History  Diagnosis Date  . Dermatitis due to food taken internally   . 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 Anemia, unspecified   . Allergic rhinitis, cause unspecified   . Amenorrhea     on BC hormone inj  . 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 Asthma   . Mass     heart  . Hypertension    Past Surgical History  Procedure Laterality Date  . Wisdom tooth extraction     Family History  Problem Relation Age of Onset  . Allergies Father     tomatoes  . Asthma Mother   . Diabetes Other     grandmother  . Cancer Paternal Grandmother   . Cancer Maternal Uncle     [employer]  . Cancer Other     great aunts   Social History  Substance Use Topics  . Smoking status: Former Smoker -- 1 years    Types: Cigars    Quit date: 08/19/2010  . Smokeless tobacco: Never Used  . Alcohol Use: No   OB History    Gravida Para Term Preterm AB TAB SAB Ectopic Multiple Living   0 0 0 0 0 0 0 0 0 0      Review of Systems  Constitutional: Negative for fever.  Respiratory: Negative for  cough and shortness of breath.   Cardiovascular: Negative for chest pain and leg swelling.  Gastrointestinal: Positive for abdominal pain and anorexia. Negative for nausea, vomiting, diarrhea, constipation and flatus.  Genitourinary: Negative for dysuria, frequency, hematuria, vaginal bleeding and vaginal discharge.  Musculoskeletal: Negative for myalgias and back pain.  Skin: Negative for rash.  Neurological: Negative for dizziness.  All other systems reviewed and are negative.     Allergies  Ibuprofen  Home Medications   Prior to Admission medications   Medication Sig Start Date End Date Taking? Authorizing Provider   albuterol (PROVENTIL HFA;VENTOLIN HFA) 108 (90 BASE) MCG/ACT inhaler Inhale 2 puffs into the lungs every 6 (six) hours as needed for wheezing or shortness of breath.     Historical Provider, MD  amoxicillin (AMOXIL) 500 MG capsule Take 1 capsule (500 mg total) by mouth 3 (three) times daily. Patient not taking: Reported on 01/21/2015 10/14/14   Trixie Dredge, PA-C  EPINEPHrine (EPIPEN 2-PAK) 0.3 mg/0.3 mL IJ SOAJ injection Inject 0.3 mLs (0.3 mg total) into the muscle once as needed (for severe allergic reaction). CAll 911 immediately if you have to use this medicine Patient not taking: Reported on 01/21/2015 09/06/14   Harle Battiest, NP  furosemide (LASIX) 40 MG tablet Take 1 tablet (40 mg total) by mouth daily. Patient not taking: Reported on 01/21/2015 09/06/14   Harle Battiest, NP  HYDROcodone-acetaminophen (NORCO/VICODIN) 5-325 MG per tablet Take 1 tablet by mouth every 6 (six) hours as needed for moderate pain or severe pain. 01/21/15   Earley Favor, NP  predniSONE (DELTASONE) 20 MG tablet Take 2 tablets (40 mg total) by mouth daily. Patient not taking: Reported on 01/21/2015 09/06/14   Harle Battiest, NP   BP 126/70 mmHg  Pulse 70  Temp(Src) 97.8 F (36.6 C) (Oral)  Resp 18  SpO2 99%  LMP 01/02/2015 (Approximate) Physical Exam  Constitutional: She appears well-developed and well-nourished.  HENT:  Head: Normocephalic.  Mouth/Throat: Oropharynx is clear and moist.  Eyes: Pupils are equal, round, and reactive to light.  Neck: Normal range of motion.  Cardiovascular: Normal rate and regular rhythm.   Pulmonary/Chest: Effort normal and breath sounds normal.  Abdominal: She exhibits no distension. There is tenderness in the right upper quadrant, right lower quadrant, periumbilical area and suprapubic area. There is guarding.  All palpation elicits pain in the right lower quadrant  Nursing note and vitals reviewed.   ED Course  Procedures (including critical care time) Labs  Review Labs Reviewed - No data to display  Imaging Review US Transvaginal Non-ob  01/21/2015   CLINICAL DATA:  Right lower quadrant abdominal pain  EXAM: TRANSABDOMINAL AND TRANSVAGINAL ULTRASOUND OF PELVIS  TECHNIQUE: Both transabdominal and transvaginal ultrasound examinations of the pelvis were performed. Transabdominal technique was performed for global imaging of the pelvis including uterus, ovaries, adnexal regions, and pelvic cul-de-sac. It was necessary to proceed with endovaginal exam following the transabdominal exam to visualize the ovaries.  COMPARISON:  None  FINDINGS: Uterus  Measurements: 7.8 x 3.4 x 4.8 cm. No fibroids or other mass visualized.  Endometrium  Thickness: 11.6 mm.  No focal abnormality visualized.  Right ovary  Measurements: 4.2 x 2.6 x 3.3 cm. Benign 2.6 cm cyst or follicle. No complex or solid lesions.  Left ovary  Measurements: 3.2 x 2.1 x 2.1 cm. Normal appearance/no adnexal mass.  Other findings  No free fluid.  IMPRESSION: Normal uterus and ovaries.   Electronically Signed   By: Rosey Bath.D.  On: 01/21/2015 00:58   US Pelvis Complete  01/21/2015   CLINICAL DATA:  Right lower quadrant abdominal pain  EXAM: TRANSABDOMINAL AND TRANSVAGINAL ULTRASOUND OF PELVIS  TECHNIQUE: Both transabdominal and transvaginal ultrasound examinations of the pelvis were performed. Transabdominal technique was performed for global imaging of the pelvis including uterus, ovaries, adnexal regions, and pelvic cul-de-sac. It was necessary to proceed with endovaginal exam following the transabdominal exam to visualize the ovaries.  COMPARISON:  None  FINDINGS: Uterus  Measurements: 7.8 x 3.4 x 4.8 cm. No fibroids or other mass visualized.  Endometrium  Thickness: 11.6 mm.  No focal abnormality visualized.  Right ovary  Measurements: 4.2 x 2.6 x 3.3 cm. Benign 2.6 cm cyst or follicle. No complex or solid lesions.  Left ovary  Measurements: 3.2 x 2.1 x 2.1 cm. Normal appearance/no adnexal mass.   Other findings  No free fluid.  IMPRESSION: Normal uterus and ovaries.   Electronically Signed   By: Ellery Plunk M.D.   On: 01/21/2015 00:58   Ct Abdomen Pelvis W Contrast  01/21/2015   CLINICAL DATA:  Acute onset of right lower quadrant abdominal pain and nausea. Initial encounter.  EXAM: CT ABDOMEN AND PELVIS WITH CONTRAST  TECHNIQUE: Multidetector CT imaging of the abdomen and pelvis was performed using the standard protocol following bolus administration of intravenous contrast.  CONTRAST:  OMNIPAQUE IOHEXOL 300 MG/ML  SOLN  COMPARISON:  Pelvic ultrasound performed earlier today at 12:16 a.m.  FINDINGS: The visualized lung bases are clear.  The liver and spleen are unremarkable in appearance. The gallbladder is within normal limits. The pancreas and adrenal glands are unremarkable.  The kidneys are unremarkable in appearance. There is no evidence of hydronephrosis. No renal or ureteral stones are seen. No perinephric stranding is appreciated.  No free fluid is identified. The small bowel is unremarkable in appearance. The stomach is within normal limits. No acute vascular abnormalities are seen.  The appendix is normal in caliber and contains air, without evidence of appendicitis. The colon is grossly unremarkable in appearance.  The bladder is largely decompressed and grossly unremarkable. Vague soft tissue inflammation is noted within the pelvis, with mild asymmetric prominence of the right ovary. This could reflect pelvic inflammatory disease. Would correlate clinically. The right ovarian follicle is again seen. The uterus is grossly unremarkable. No inguinal lymphadenopathy is seen.  No acute osseous abnormalities are identified.  IMPRESSION: Vague soft tissue inflammation within the pelvis raises question for pelvic inflammatory disease. Would correlate for associated symptoms.   Electronically Signed   By: Roanna Raider M.D.   On: 01/21/2015 03:09   I have personally reviewed and  evaluated these images and lab results as part of my medical decision-making.   EKG Interpretation None      MDM   Final diagnoses:  Cyst of right ovary         Earley Favor, NP 01/22/15 0454  Linwood Dibbles, MD 01/26/15 [phone]

## 2015-01-21 NOTE — ED Notes (Signed)
Pt presents to ED with c/o RLQ abdominal pain with nausea, onset 2 days ago.  Reports pain as stabbing, worse on movement.  Denies vomiting or diarrhea; denies dysuria.

## 2015-01-21 NOTE — Discharge Instructions (Signed)
Ovarian Cyst An ovarian cyst is a sac filled with fluid or blood. This sac is attached to the ovary. Some cysts go away on their own. Other cysts need treatment.  HOME CARE   Only take medicine as told by your doctor.  Follow up with your doctor as told.  Get regular pelvic exams and Pap tests. GET HELP IF:  Your periods are late, not regular, or painful.  You stop having periods.  Your belly (abdominal) or pelvic pain does not go away.  Your belly becomes large or puffy (swollen).  You have a hard time peeing (totally emptying your bladder).  You have pressure on your bladder.  You have pain during sex.  You feel fullness, pressure, or discomfort in your belly.  You lose weight for no reason.  You feel sick most of the time.  You have a hard time pooping (constipation).  You do not feel like eating.  You develop pimples (acne).  You have an increase in hair on your body and face.  You are gaining weight for no reason.  You think you are pregnant. GET HELP RIGHT AWAY IF:   Your belly pain gets worse.  You feel sick to your stomach (nauseous), and you throw up (vomit).  You have a fever that comes on fast.  You have belly pain while pooping (bowel movement).  Your periods are heavier than usual. MAKE SURE YOU:   Understand these instructions.  Will watch your condition.  Will get help right away if you are not doing well or get worse. Document Released: 10/23/2007 Document Revised: 02/24/2013 Document Reviewed: 01/11/2013 [facility] Patient Information 2015 [place], [place]. This information is not intended to replace advice given to you by your health care provider. Make sure you discuss any questions you have with your health care provider. Your ultrasound and CT Scan show a right ovarian cyst

## 2015-01-21 NOTE — Discharge Instructions (Signed)

## 2015-01-21 NOTE — ED Notes (Signed)
Pt reports that she was just seen at [facility]--- did not want ambulance transfer; was discharged and was advised to come here for further evaluation and treatment.

## 2015-03-21 ENCOUNTER — Emergency Department (HOSPITAL_BASED_OUTPATIENT_CLINIC_OR_DEPARTMENT_OTHER): Payer: No Typology Code available for payment source

## 2015-03-21 ENCOUNTER — Encounter (HOSPITAL_BASED_OUTPATIENT_CLINIC_OR_DEPARTMENT_OTHER): Payer: Self-pay

## 2015-03-21 ENCOUNTER — Emergency Department (HOSPITAL_BASED_OUTPATIENT_CLINIC_OR_DEPARTMENT_OTHER)
Admission: EM | Admit: 2015-03-21 | Discharge: 2015-03-21 | Disposition: A | Discharge status: Home / Self Care | Payer: No Typology Code available for payment source | Attending: Emergency Medicine | Admitting: Emergency Medicine

## 2015-03-21 DIAGNOSIS — Z862 Personal history of diseases of the blood and blood-forming organs and certain disorders involving the immune mechanism: Secondary | ICD-10-CM | POA: Insufficient documentation

## 2015-03-21 DIAGNOSIS — R0600 Dyspnea, unspecified: Secondary | ICD-10-CM

## 2015-03-21 DIAGNOSIS — J45901 Unspecified asthma with (acute) exacerbation: Secondary | ICD-10-CM | POA: Insufficient documentation

## 2015-03-21 DIAGNOSIS — I10 Essential (primary) hypertension: Secondary | ICD-10-CM | POA: Insufficient documentation

## 2015-03-21 DIAGNOSIS — Z3202 Encounter for pregnancy test, result negative: Secondary | ICD-10-CM | POA: Insufficient documentation

## 2015-03-21 DIAGNOSIS — Z8742 Personal history of other diseases of the female genital tract: Secondary | ICD-10-CM | POA: Insufficient documentation

## 2015-03-21 DIAGNOSIS — Z87891 Personal history of nicotine dependence: Secondary | ICD-10-CM | POA: Insufficient documentation

## 2015-03-21 DIAGNOSIS — R197 Diarrhea, unspecified: Secondary | ICD-10-CM | POA: Insufficient documentation

## 2015-03-21 DIAGNOSIS — Z872 Personal history of diseases of the skin and subcutaneous tissue: Secondary | ICD-10-CM | POA: Insufficient documentation

## 2015-03-21 DIAGNOSIS — R6883 Chills (without fever): Secondary | ICD-10-CM | POA: Insufficient documentation

## 2015-03-21 LAB — COMPREHENSIVE METABOLIC PANEL
ALK PHOS: 62 U/L (ref 38–126)
ALT: 13 U/L — AB (ref 14–54)
AST: 22 U/L (ref 15–41)
Albumin: 4 g/dL (ref 3.5–5.0)
Anion gap: 7 (ref 5–15)
BILIRUBIN TOTAL: 0.4 mg/dL (ref 0.3–1.2)
BUN: 8 mg/dL (ref 6–20)
CALCIUM: 8.8 mg/dL — AB (ref 8.9–10.3)
CO2: 27 mmol/L (ref 22–32)
CREATININE: 0.73 mg/dL (ref 0.44–1.00)
Chloride: 106 mmol/L (ref 101–111)
GFR calc non Af Amer: >60 mL/min (ref >60)
Glucose, Bld: 89 mg/dL (ref 65–99)
Potassium: 3.8 mmol/L (ref 3.5–5.1)
SODIUM: 140 mmol/L (ref 135–145)
TOTAL PROTEIN: 7.5 g/dL (ref 6.5–8.1)

## 2015-03-21 LAB — CBC WITH DIFFERENTIAL/PLATELET
BASOS ABS: 0 10*3/uL (ref 0.0–0.1)
Basophils Relative: 0 %
Eosinophils Absolute: 0.2 10*3/uL (ref 0.0–0.7)
Eosinophils Relative: 3 %
HEMATOCRIT: 35 % — AB (ref 36.0–46.0)
Hemoglobin: 11 g/dL — ABNORMAL LOW (ref 12.0–15.0)
LYMPHS ABS: 3.7 10*3/uL (ref 0.7–4.0)
Lymphocytes Relative: 45 %
MCH: 23.1 pg — AB (ref 26.0–34.0)
MCHC: 31.4 g/dL (ref 30.0–36.0)
MCV: 73.5 fL — AB (ref 78.0–100.0)
MONO ABS: 0.6 10*3/uL (ref 0.1–1.0)
MONOS PCT: 7 %
NEUTROS PCT: 45 %
Neutro Abs: 3.8 10*3/uL (ref 1.7–7.7)
Platelets: 268 10*3/uL (ref 150–400)
RBC: 4.76 MIL/uL (ref 3.87–5.11)
RDW: 16.2 % — AB (ref 11.5–15.5)
WBC: 8.3 10*3/uL (ref 4.0–10.5)

## 2015-03-21 LAB — PREGNANCY, URINE: Preg Test, Ur: NEGATIVE

## 2015-03-21 LAB — URINE MICROSCOPIC-ADD ON

## 2015-03-21 LAB — URINALYSIS, ROUTINE W REFLEX MICROSCOPIC
BILIRUBIN URINE: NEGATIVE
Glucose, UA: NEGATIVE mg/dL
HGB URINE DIPSTICK: NEGATIVE
Ketones, ur: NEGATIVE mg/dL
Nitrite: NEGATIVE
PH: 6 (ref 5.0–8.0)
Protein, ur: NEGATIVE mg/dL
SPECIFIC GRAVITY, URINE: 1.02 (ref 1.005–1.030)
UROBILINOGEN UA: 0.2 mg/dL (ref 0.0–1.0)

## 2015-03-21 LAB — D-DIMER, QUANTITATIVE: D-Dimer, Quant: <0.27 ug/mL-FEU (ref 0.00–0.48)

## 2015-03-21 MED ORDER — ONDANSETRON HCL 4 MG/2ML IJ SOLN
4.0000 mg | Freq: Once | INTRAMUSCULAR | Status: AC
  Administered 2015-03-21: 4 mg via INTRAVENOUS
  Filled 2015-03-21: qty 2

## 2015-03-21 MED ORDER — SODIUM CHLORIDE 0.9 % IV BOLUS (SEPSIS)
1000.0000 mL | Freq: Once | INTRAVENOUS | Status: AC
  Administered 2015-03-21: 1000 mL via INTRAVENOUS

## 2015-03-21 MED ORDER — ONDANSETRON 4 MG PO TBDP: ORAL_TABLET | ORAL | Status: DC

## 2015-03-21 NOTE — ED Notes (Signed)
C/o diarrhea since Friday vomiting since Saturday-SOB started yesterday-pt NAD-steady gait

## 2015-03-21 NOTE — Discharge Instructions (Signed)

## 2015-03-21 NOTE — ED Notes (Signed)
Nurse first note: Patient reports that she is SOB with a  History of asthma. O2 sats WNL - 100% at desk. The patient in no active distress,  Coughing frequently

## 2015-03-23 NOTE — ED Provider Notes (Signed)
CSN: [id number]     Arrival date & time 03/21/15  1303 History   First MD Initiated Contact with Patient 03/21/15 1544     Chief Complaint  Patient presents with  . Diarrhea  . Shortness of Breath     (Consider location/radiation/quality/duration/timing/severity/associated sxs/prior Treatment) Patient is a 24 y.o. female presenting with diarrhea.  Diarrhea Quality:  Semi-solid Severity:  Moderate Onset quality:  Gradual Number of episodes:  3 Timing:  Constant Progression:  Unchanged Relieved by:  Nothing Worsened by:  Nothing tried Ineffective treatments:  None tried Associated symptoms: chills and URI   Associated symptoms: no abdominal pain, no diaphoresis and no fever     Past Medical History  Diagnosis Date  . Dermatitis due to food taken internally   . 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 Anemia, unspecified   . Allergic rhinitis, cause unspecified   . Amenorrhea     on BC hormone inj  . 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 Asthma   . Mass     heart  . Hypertension    Past Surgical History  Procedure Laterality Date  . Wisdom tooth extraction     Family History  Problem Relation Age of Onset  . Allergies Father     tomatoes  . Asthma Mother   . Diabetes Other     grandmother  . Cancer Paternal Grandmother   . Cancer Maternal Uncle     [employer]  . Cancer Other     great aunts   Social History  Substance Use Topics  . Smoking status: Former Smoker -- 1 years    Types: Cigars    Quit date: 08/19/2010  . Smokeless tobacco: Never Used  . Alcohol Use: No   OB History    Gravida Para Term Preterm AB TAB SAB Ectopic Multiple Living       Review of Systems  Constitutional: Positive for chills. Negative for fever and diaphoresis.  Gastrointestinal: Positive for diarrhea. Negative for abdominal pain.  All other systems reviewed and are negative.     Allergies  Ibuprofen  Home Medications   Prior to Admission medications    Medication Sig Start Date End Date Taking? Authorizing Provider  ondansetron (ZOFRAN ODT) 4 MG disintegrating tablet  ODT q4 hours prn nausea/vomit 03/21/15   Mirian Mo, MD   BP 122/75 mmHg  Pulse 80  Temp(Src) 98.3 F (36.8 C) (Oral)  Resp 20  Ht  (1.575 m)  Wt 250 lb (113.399 kg)  BMI 45.71 kg/m2  SpO2 100%  LMP 03/09/2015 Physical Exam  Constitutional: She is oriented to person, place, and time. She appears well-developed and well-nourished.  HENT:  Head: Normocephalic and atraumatic.  Right Ear: External ear normal.  Left Ear: External ear normal.  Eyes: Conjunctivae and EOM are normal. Pupils are equal, round, and reactive to light.  Neck: Normal range of motion. Neck supple.  Cardiovascular: Normal rate, regular rhythm, normal heart sounds and intact distal pulses.   Pulmonary/Chest: Effort normal and breath sounds normal.  Abdominal: Soft. Bowel sounds are normal. There is no tenderness.  Musculoskeletal: Normal range of motion.  Neurological: She is alert and oriented to person, place, and time.  Skin: Skin is warm and dry.  Vitals reviewed.   ED Course  Procedures (including critical care time) Labs Review Labs Reviewed  CBC WITH DIFFERENTIAL/PLATELET - Abnormal; Notable for the following:    Hemoglobin 11.0 (*)  HCT 35.0 (*)    MCV 73.5 (*)    MCH 23.1 (*)    RDW 16.2 (*)    All other components within normal limits  COMPREHENSIVE METABOLIC PANEL - Abnormal; Notable for the following:    Calcium 8.8 (*)    ALT 13 (*)    All other components within normal limits  URINALYSIS, ROUTINE W REFLEX MICROSCOPIC (NOT AT [facility]) - Abnormal; Notable for the following:    APPearance TURBID (*)    Leukocytes, UA SMALL (*)    All other components within normal limits  URINE MICROSCOPIC-ADD ON - Abnormal; Notable for the following:    Squamous Epithelial / LPF MANY (*)    Bacteria, UA FEW (*)    All other components within normal limits  PREGNANCY, URINE   D-DIMER, QUANTITATIVE (NOT AT [facility])    Imaging Review Dg Chest 2 View  03/21/2015  CLINICAL DATA:  Cough, difficulty breathing for 5 days. EXAM: CHEST  2 VIEW COMPARISON:  05/23/2012 FINDINGS: Heart is upper limits normal in size. No confluent airspace opacities or effusions. No acute bony abnormality. IMPRESSION: No active cardiopulmonary disease. Electronically Signed   By: Charlett NoseKevin  Dover M.D.   On: 03/21/2015 16:20   I have personally reviewed and evaluated these images and lab results as part of my medical decision-making.   EKG Interpretation   Date/Time:  Tuesday March 21 2015 16:17:45 EDT Ventricular Rate:  70 PR Interval:  144 QRS Duration: 76 QT Interval:  388 QTC Calculation: 419 R Axis:   61 Text Interpretation:  Normal sinus rhythm Cannot rule out Anterior infarct  , age undetermined Abnormal ECG ED PHYSICIAN INTERPRETATION AVAILABLE IN  [facility] Confirmed by TEST, Record ([id number]) on 03/22/2015 7:02:26 AM      MDM   Final diagnoses:  Diarrhea, unspecified type  Dyspnea    24 y.o. female with pertinent PMH of asthma presents with uri symptoms with nausea and diarrhea.  Physical exam benign.  Wu unremarkable.  DC home in stable condition.    I have reviewed all laboratory and imaging studies if ordered as above  1. Diarrhea, unspecified type   2. Dyspnea         Mirian MoMatthew Lyndy Russman, MD 03/23/15 [phone]

## 2015-07-28 ENCOUNTER — Encounter (HOSPITAL_COMMUNITY): Payer: Self-pay | Admitting: Emergency Medicine

## 2015-07-28 ENCOUNTER — Emergency Department (HOSPITAL_COMMUNITY)
Admission: EM | Admit: 2015-07-28 | Discharge: 2015-07-28 | Disposition: A | Discharge status: Home / Self Care | Payer: No Typology Code available for payment source | Attending: Emergency Medicine | Admitting: Emergency Medicine

## 2015-07-28 DIAGNOSIS — D649 Anemia, unspecified: Secondary | ICD-10-CM | POA: Insufficient documentation

## 2015-07-28 DIAGNOSIS — J45909 Unspecified asthma, uncomplicated: Secondary | ICD-10-CM | POA: Insufficient documentation

## 2015-07-28 DIAGNOSIS — I10 Essential (primary) hypertension: Secondary | ICD-10-CM | POA: Insufficient documentation

## 2015-07-28 DIAGNOSIS — Z87891 Personal history of nicotine dependence: Secondary | ICD-10-CM | POA: Insufficient documentation

## 2015-07-28 DIAGNOSIS — K529 Noninfective gastroenteritis and colitis, unspecified: Secondary | ICD-10-CM

## 2015-07-28 LAB — COMPREHENSIVE METABOLIC PANEL
ALK PHOS: 62 U/L (ref 38–126)
ALT: 15 U/L (ref 14–54)
AST: 33 U/L (ref 15–41)
Albumin: 4.1 g/dL (ref 3.5–5.0)
Anion gap: 13 (ref 5–15)
BILIRUBIN TOTAL: 0.5 mg/dL (ref 0.3–1.2)
BUN: 11 mg/dL (ref 6–20)
CALCIUM: 9.4 mg/dL (ref 8.9–10.3)
CHLORIDE: 106 mmol/L (ref 101–111)
CO2: 19 mmol/L — ABNORMAL LOW (ref 22–32)
CREATININE: 0.76 mg/dL (ref 0.44–1.00)
Glucose, Bld: 108 mg/dL — ABNORMAL HIGH (ref 65–99)
Potassium: 4 mmol/L (ref 3.5–5.1)
Sodium: 138 mmol/L (ref 135–145)
Total Protein: 7.3 g/dL (ref 6.5–8.1)

## 2015-07-28 LAB — LIPASE, BLOOD: Lipase: 24 U/L (ref 11–51)

## 2015-07-28 LAB — CBC
HCT: 37.9 % (ref 36.0–46.0)
Hemoglobin: 11.8 g/dL — ABNORMAL LOW (ref 12.0–15.0)
MCH: 23 pg — ABNORMAL LOW (ref 26.0–34.0)
MCHC: 31.1 g/dL (ref 30.0–36.0)
MCV: 73.7 fL — AB (ref 78.0–100.0)
PLATELETS: 286 10*3/uL (ref 150–400)
RBC: 5.14 MIL/uL — AB (ref 3.87–5.11)
RDW: 15.9 % — AB (ref 11.5–15.5)
WBC: 10.6 10*3/uL — AB (ref 4.0–10.5)

## 2015-07-28 MED ORDER — LOPERAMIDE HCL 2 MG PO CAPS: 2.0000 mg | ORAL_CAPSULE | Freq: Four times a day (QID) | ORAL | Status: DC | PRN

## 2015-07-28 MED ORDER — HYDROMORPHONE HCL 1 MG/ML IJ SOLN
1.0000 mg | Freq: Once | INTRAMUSCULAR | Status: AC
  Administered 2015-07-28: 1 mg via INTRAVENOUS
  Filled 2015-07-28: qty 1

## 2015-07-28 MED ORDER — ONDANSETRON 4 MG PO TBDP
ORAL_TABLET | ORAL | Status: AC
  Filled 2015-07-28: qty 1

## 2015-07-28 MED ORDER — FENTANYL CITRATE (PF) 100 MCG/2ML IJ SOLN
INTRAMUSCULAR | Status: AC
  Administered 2015-07-28: 100 ug
  Filled 2015-07-28: qty 2

## 2015-07-28 MED ORDER — SODIUM CHLORIDE 0.9 % IV BOLUS (SEPSIS)
1000.0000 mL | Freq: Once | INTRAVENOUS | Status: AC
  Administered 2015-07-28: 1000 mL via INTRAVENOUS

## 2015-07-28 MED ORDER — ONDANSETRON HCL 4 MG/2ML IJ SOLN
4.0000 mg | Freq: Once | INTRAMUSCULAR | Status: AC
  Administered 2015-07-28: 4 mg via INTRAVENOUS
  Filled 2015-07-28: qty 2

## 2015-07-28 MED ORDER — PROMETHAZINE HCL 25 MG PO TABS: 25.0000 mg | ORAL_TABLET | Freq: Four times a day (QID) | ORAL | Status: DC | PRN

## 2015-07-28 MED ORDER — ONDANSETRON 4 MG PO TBDP
4.0000 mg | ORAL_TABLET | Freq: Once | ORAL | Status: AC | PRN
  Administered 2015-07-28: 4 mg via ORAL

## 2015-07-28 NOTE — Discharge Instructions (Signed)

## 2015-07-28 NOTE — ED Provider Notes (Signed)
CSN: [id number]     Arrival date & time 07/28/15  0130 History   First MD Initiated Contact with Patient 07/28/15 [phone]     Chief Complaint  Patient presents with  . Abdominal Pain     (Consider location/radiation/quality/duration/timing/severity/associated sxs/prior Treatment) HPI Comments: Patient presents to the emergency department for evaluation of abdominal pain. Patient reports that around 6 PM she started having nausea and burning sensation in her upper abdomen. Burning worsened and then she became more nauseated and started vomiting. This was followed by onset of watery diarrhea and she has been profusely vomiting and spitting diarrhea ever since. She reports diffuse cramping across her upper abdomen currently. She does not have any known sick contacts but does work at a daycare. There is no sinus congestion, sore throat, cough or cold symptoms.  Patient is a 25 y.o. female presenting with abdominal pain.  Abdominal Pain Associated symptoms: diarrhea, nausea and vomiting     Past Medical History  Diagnosis Date  . Dermatitis due to food taken internally   . 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 Anemia, unspecified   . Allergic rhinitis, cause unspecified   . Amenorrhea     on BC hormone inj  . 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 Asthma   . Mass     heart  . Hypertension    Past Surgical History  Procedure Laterality Date  . Wisdom tooth extraction     Family History  Problem Relation Age of Onset  . Allergies Father     tomatoes  . Asthma Mother   . Diabetes Other     grandmother  . Cancer Paternal Grandmother   . Cancer Maternal Uncle     [employer] Uncle  . Cancer Other     great aunts   Social History  Substance Use Topics  . Smoking status: Former Smoker -- 1 years    Types: Cigars    Quit date: 08/19/2010  . Smokeless tobacco: Never Used  . Alcohol Use: No   OB History    Gravida Para Term Preterm AB TAB SAB Ectopic Multiple Living   0 0 0 0 0 0 0 0 0 0      Review of Systems  Gastrointestinal: Positive for nausea, vomiting, abdominal pain and diarrhea.  All other systems reviewed and are negative.     Allergies  Ibuprofen  Home Medications   Prior to Admission medications   Medication Sig Start Date End Date Taking? Authorizing Provider  ondansetron (ZOFRAN ODT) 4 MG disintegrating tablet 4mg  ODT q4 hours prn nausea/vomit 03/21/15   Mirian MoMatthew Gentry, MD   BP 131/78 mmHg  Pulse 69  Temp(Src) 98.3 F (36.8 C) (Oral)  Resp 18  Ht 5\' 3"  (1.6 m)  Wt 220 lb (99.791 kg)  BMI 38.98 kg/m2  SpO2 100%  LMP 07/07/2015 Physical Exam  Constitutional: She is oriented to person, place, and time. She appears well-developed and well-nourished. No distress.  HENT:  Head: Normocephalic and atraumatic.  Right Ear: Hearing normal.  Left Ear: Hearing normal.  Nose: Nose normal.  Mouth/Throat: Oropharynx is clear and moist and mucous membranes are normal.  Eyes: Conjunctivae and EOM are normal. Pupils are equal, round, and reactive to light.  Neck: Normal range of motion. Neck supple.  Cardiovascular: Regular rhythm, S1 normal and S2 normal.  Exam reveals no gallop and no friction rub.   No murmur heard. Pulmonary/Chest: Effort normal and breath sounds normal. No respiratory distress. She exhibits no tenderness.  Abdominal: Soft. Normal appearance and bowel sounds are normal. There is no hepatosplenomegaly. There is generalized tenderness. There is no rebound, no guarding, no tenderness at McBurney's point and negative Murphy's sign. No hernia.  Musculoskeletal: Normal range of motion.  Neurological: She is alert and oriented to person, place, and time. She has normal strength. No cranial nerve deficit or sensory deficit. Coordination normal. GCS eye subscore is 4. GCS verbal subscore is 5. GCS motor subscore is 6.  Skin: Skin is warm, dry and intact. No rash noted. No cyanosis.  Psychiatric: She has a normal mood and affect. Her speech is normal  and behavior is normal. Thought content normal.  Nursing note and vitals reviewed.   ED Course  Procedures (including critical care time) Labs Review Labs Reviewed  COMPREHENSIVE METABOLIC PANEL - Abnormal; Notable for the following:    CO2 19 (*)    Glucose, Bld 108 (*)    All other components within normal limits  CBC - Abnormal; Notable for the following:    WBC 10.6 (*)    RBC 5.14 (*)    Hemoglobin 11.8 (*)    MCV 73.7 (*)    MCH 23.0 (*)    RDW 15.9 (*)    All other components within normal limits  LIPASE, BLOOD  URINALYSIS, ROUTINE W REFLEX MICROSCOPIC (NOT AT [facility])  POC URINE PREG, ED    Imaging Review No results found. I have personally reviewed and evaluated these images and lab results as part of my medical decision-making.   EKG Interpretation None      MDM   Final diagnoses:  None  Gastroenteritis  Patient presents to the ER for evaluation of nausea, vomiting, diarrhea with abdominal discomfort. Symptoms began 6 PM last night. Patient has a benign abdominal exam. There is diffuse tenderness, but no guarding or rebound. No Murphy sign. No tenderness at McBurney's point. Patient feeling much better after fluids. She'll be discharged symptomatically treatment, return to the ER for symptoms worsen.    Gilda Crease, MD 07/28/15 [phone]

## 2015-07-28 NOTE — ED Notes (Signed)
Pt c/o upper abdominal pain with nvd since 6pm. sts it burns.

## 2015-07-28 NOTE — ED Notes (Signed)
Pt made aware need for urine.  

## 2015-09-21 ENCOUNTER — Emergency Department (HOSPITAL_COMMUNITY): Payer: Self-pay

## 2015-09-21 ENCOUNTER — Emergency Department (HOSPITAL_COMMUNITY)
Admission: EM | Admit: 2015-09-21 | Discharge: 2015-09-21 | Disposition: A | Discharge status: Home / Self Care | Payer: Self-pay | Attending: Emergency Medicine | Admitting: Emergency Medicine

## 2015-09-21 ENCOUNTER — Encounter (HOSPITAL_COMMUNITY): Payer: Self-pay | Admitting: *Deleted

## 2015-09-21 DIAGNOSIS — Z3201 Encounter for pregnancy test, result positive: Secondary | ICD-10-CM

## 2015-09-21 DIAGNOSIS — O219 Vomiting of pregnancy, unspecified: Secondary | ICD-10-CM | POA: Insufficient documentation

## 2015-09-21 DIAGNOSIS — Z3A01 Less than 8 weeks gestation of pregnancy: Secondary | ICD-10-CM | POA: Insufficient documentation

## 2015-09-21 DIAGNOSIS — O10011 Pre-existing essential hypertension complicating pregnancy, first trimester: Secondary | ICD-10-CM | POA: Insufficient documentation

## 2015-09-21 DIAGNOSIS — Z79899 Other long term (current) drug therapy: Secondary | ICD-10-CM | POA: Insufficient documentation

## 2015-09-21 DIAGNOSIS — O9989 Other specified diseases and conditions complicating pregnancy, childbirth and the puerperium: Secondary | ICD-10-CM | POA: Insufficient documentation

## 2015-09-21 DIAGNOSIS — Z872 Personal history of diseases of the skin and subcutaneous tissue: Secondary | ICD-10-CM | POA: Insufficient documentation

## 2015-09-21 DIAGNOSIS — Z862 Personal history of diseases of the blood and blood-forming organs and certain disorders involving the immune mechanism: Secondary | ICD-10-CM | POA: Insufficient documentation

## 2015-09-21 DIAGNOSIS — Z3490 Encounter for supervision of normal pregnancy, unspecified, unspecified trimester: Secondary | ICD-10-CM

## 2015-09-21 DIAGNOSIS — R109 Unspecified abdominal pain: Secondary | ICD-10-CM | POA: Insufficient documentation

## 2015-09-21 DIAGNOSIS — O99511 Diseases of the respiratory system complicating pregnancy, first trimester: Secondary | ICD-10-CM | POA: Insufficient documentation

## 2015-09-21 DIAGNOSIS — J45909 Unspecified asthma, uncomplicated: Secondary | ICD-10-CM | POA: Insufficient documentation

## 2015-09-21 DIAGNOSIS — Z87891 Personal history of nicotine dependence: Secondary | ICD-10-CM | POA: Insufficient documentation

## 2015-09-21 LAB — CBC WITH DIFFERENTIAL/PLATELET
Basophils Absolute: 0 10*3/uL (ref 0.0–0.1)
Basophils Relative: 0 %
Eosinophils Absolute: 0.1 10*3/uL (ref 0.0–0.7)
Eosinophils Relative: 1 %
HCT: 35.2 % — ABNORMAL LOW (ref 36.0–46.0)
Hemoglobin: 11.3 g/dL — ABNORMAL LOW (ref 12.0–15.0)
Lymphocytes Relative: 36 %
Lymphs Abs: 2.8 10*3/uL (ref 0.7–4.0)
MCH: 24.2 pg — ABNORMAL LOW (ref 26.0–34.0)
MCHC: 32.1 g/dL (ref 30.0–36.0)
MCV: 75.5 fL — ABNORMAL LOW (ref 78.0–100.0)
Monocytes Absolute: 0.5 10*3/uL (ref 0.1–1.0)
Monocytes Relative: 6 %
Neutro Abs: 4.5 10*3/uL (ref 1.7–7.7)
Neutrophils Relative %: 57 %
Platelets: 267 10*3/uL (ref 150–400)
RBC: 4.66 MIL/uL (ref 3.87–5.11)
RDW: 16.5 % — ABNORMAL HIGH (ref 11.5–15.5)
WBC: 8 10*3/uL (ref 4.0–10.5)

## 2015-09-21 LAB — COMPREHENSIVE METABOLIC PANEL
ALBUMIN: 3.8 g/dL (ref 3.5–5.0)
ALK PHOS: 52 U/L (ref 38–126)
ALT: 18 U/L (ref 14–54)
ANION GAP: 10 (ref 5–15)
AST: 23 U/L (ref 15–41)
BILIRUBIN TOTAL: 0.5 mg/dL (ref 0.3–1.2)
BUN: 7 mg/dL (ref 6–20)
CALCIUM: 8.9 mg/dL (ref 8.9–10.3)
CO2: 21 mmol/L — ABNORMAL LOW (ref 22–32)
Chloride: 105 mmol/L (ref 101–111)
Creatinine, Ser: 0.78 mg/dL (ref 0.44–1.00)
Glucose, Bld: 89 mg/dL (ref 65–99)
POTASSIUM: 3.9 mmol/L (ref 3.5–5.1)
Sodium: 136 mmol/L (ref 135–145)
TOTAL PROTEIN: 6.9 g/dL (ref 6.5–8.1)

## 2015-09-21 LAB — URINALYSIS, ROUTINE W REFLEX MICROSCOPIC
Bilirubin Urine: NEGATIVE
Glucose, UA: NEGATIVE mg/dL
Hgb urine dipstick: NEGATIVE
Ketones, ur: 15 mg/dL — AB
Leukocytes, UA: NEGATIVE
Nitrite: NEGATIVE
Protein, ur: NEGATIVE mg/dL
Specific Gravity, Urine: 1.023 (ref 1.005–1.030)
pH: 7 (ref 5.0–8.0)

## 2015-09-21 LAB — TYPE AND SCREEN
ABO/RH(D): O NEG
ANTIBODY SCREEN: NEGATIVE

## 2015-09-21 LAB — HCG, QUANTITATIVE, PREGNANCY: hCG, Beta Chain, Quant, S: 19887 m[IU]/mL — ABNORMAL HIGH (ref <5)

## 2015-09-21 LAB — PREGNANCY, URINE: Preg Test, Ur: POSITIVE — AB

## 2015-09-21 LAB — ABO/RH: ABO/RH(D): O NEG

## 2015-09-21 MED ORDER — PROMETHAZINE HCL 25 MG PO TABS: 25.0000 mg | ORAL_TABLET | Freq: Three times a day (TID) | ORAL | Status: DC | PRN

## 2015-09-21 MED ORDER — ONDANSETRON HCL 4 MG/2ML IJ SOLN
4.0000 mg | Freq: Once | INTRAMUSCULAR | Status: AC
  Administered 2015-09-21: 4 mg via INTRAVENOUS
  Filled 2015-09-21: qty 2

## 2015-09-21 MED ORDER — ONDANSETRON HCL 4 MG PO TABS: 4.0000 mg | ORAL_TABLET | Freq: Four times a day (QID) | ORAL | Status: DC

## 2015-09-21 MED ORDER — MORPHINE SULFATE (PF) 4 MG/ML IV SOLN
4.0000 mg | Freq: Once | INTRAVENOUS | Status: AC
  Administered 2015-09-21: 4 mg via INTRAVENOUS
  Filled 2015-09-21: qty 1

## 2015-09-21 NOTE — ED Notes (Signed)
Patient not triaged by nurse first.   Patient was a pull to full.   When patient name moved to room, triage complete showed up.

## 2015-09-21 NOTE — Discharge Instructions (Signed)
Return here as needed.  Follow-up with the clinic provided.  Your about 6 weeks and 3 days along in your pregnancy

## 2015-09-21 NOTE — Progress Notes (Signed)
Spoke to patient regarding primary care resources and the [facility]. Orange card application provided and explained, pt instructed to contact me once application is complete for an eligibility appointment. Resource guide and my contact information also provided for any future questions or concerns. No other Community Health & Eligibility Specialist needs identified at this time.  Buddy DutyFelicia Cordova [id number]  [phone]

## 2015-09-21 NOTE — ED Notes (Signed)
Pt c/o nausea and vomiting up blood x 3days. States that her lower abd hurts.

## 2015-09-21 NOTE — ED Provider Notes (Signed)
CSN: [id number]     Arrival date & time 09/21/15  0849 History   First MD Initiated Contact with Patient 09/21/15 [phone]     Chief Complaint  Patient presents with  . Hematemesis     (Consider location/radiation/quality/duration/timing/severity/associated sxs/prior Treatment) HPI Patient presents to the emergency department with vomiting and noting blood streaking of the vomit.  The patient states that the vomiting is mostly in the morning which feels nauseated constantly.  The patient states that nothing seems to make the condition better, but drinking fluids, make the condition worse.  Patient did not take any medications prior to arrival. The patient denies chest pain, shortness of breath, headache,blurred vision, neck pain, fever, cough, weakness, numbness, dizziness, anorexia, edema,  diarrhea, rash, back pain, dysuria, hematemesis, bloody stool, near syncope, or syncope. Past Medical History  Diagnosis Date  . Dermatitis due to food taken internally   . 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 Anemia, unspecified   . Allergic rhinitis, cause unspecified   . Amenorrhea     on BC hormone inj  . 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 Asthma   . Mass     heart  . Hypertension    Past Surgical History  Procedure Laterality Date  . Wisdom tooth extraction     Family History  Problem Relation Age of Onset  . Allergies Father     tomatoes  . Asthma Mother   . Diabetes Other     grandmother  . Cancer Paternal Grandmother   . Cancer Maternal Uncle     [employer] Uncle  . Cancer Other     great aunts   Social History  Substance Use Topics  . Smoking status: Former Smoker -- 1 years    Types: Cigars    Quit date: 08/19/2010  . Smokeless tobacco: Never Used  . Alcohol Use: No   OB History    Gravida Para Term Preterm AB TAB SAB Ectopic Multiple Living   0 0 0 0 0 0 0 0 0 0      Review of Systems All other systems negative except as documented in the HPI. All pertinent positives and  negatives as reviewed in the HPI.    Allergies  Ibuprofen  Home Medications   Prior to Admission medications   Medication Sig Start Date End Date Taking? Authorizing Provider  albuterol (PROVENTIL HFA;VENTOLIN HFA) 108 (90 Base) MCG/ACT inhaler Inhale 1 puff into the lungs every 6 (six) hours as needed for wheezing or shortness of breath.   Yes Historical Provider, MD  loperamide (IMODIUM) 2 MG capsule Take 1 capsule (2 mg total) by mouth 4 (four) times daily as needed for diarrhea or loose stools. Patient not taking: Reported on 09/21/2015 07/28/15   Gilda Creasehristopher J Pollina, MD  ondansetron (ZOFRAN ODT) 4 MG disintegrating tablet 4mg  ODT q4 hours prn nausea/vomit Patient not taking: Reported on 07/28/2015 03/21/15   Mirian MoMatthew Gentry, MD  promethazine (PHENERGAN) 25 MG tablet Take 1 tablet (25 mg total) by mouth every 6 (six) hours as needed for nausea or vomiting. Patient not taking: Reported on 09/21/2015 07/28/15   Gilda Creasehristopher J Pollina, MD   BP 116/66 mmHg  Pulse 53  Resp 19  SpO2 96% Physical Exam  Constitutional: She is oriented to person, place, and time. She appears well-developed and well-nourished. No distress.  HENT:  Head: Normocephalic and atraumatic.  Mouth/Throat: Oropharynx is clear and moist.  Eyes: Pupils are equal, round, and reactive to light.  Neck: Normal range  of motion. Neck supple.  Cardiovascular: Normal rate, regular rhythm and normal heart sounds.  Exam reveals no gallop and no friction rub.   No murmur heard. Pulmonary/Chest: Effort normal and breath sounds normal. No respiratory distress. She has no wheezes.  Abdominal: Soft. Bowel sounds are normal. She exhibits no distension. There is tenderness. There is no rebound and no guarding.  Neurological: She is alert and oriented to person, place, and time. She exhibits normal muscle tone. Coordination normal.  Skin: Skin is warm and dry. No rash noted. No erythema.  Psychiatric: She has a normal mood and affect. Her  behavior is normal.  Nursing note and vitals reviewed.   ED Course  Procedures (including critical care time) Labs Review Labs Reviewed  COMPREHENSIVE METABOLIC PANEL - Abnormal; Notable for the following:    CO2 21 (*)    All other components within normal limits  CBC WITH DIFFERENTIAL/PLATELET - Abnormal; Notable for the following:    Hemoglobin 11.3 (*)    HCT 35.2 (*)    MCV 75.5 (*)    MCH 24.2 (*)    RDW 16.5 (*)    All other components within normal limits  URINALYSIS, ROUTINE W REFLEX MICROSCOPIC (NOT AT [facility]) - Abnormal; Notable for the following:    Ketones, ur 15 (*)    All other components within normal limits  PREGNANCY, URINE - Abnormal; Notable for the following:    Preg Test, Ur POSITIVE (*)    All other components within normal limits  HCG, QUANTITATIVE, PREGNANCY - Abnormal; Notable for the following:    hCG, Beta Chain, Quant, [place] [address] (*)    All other components within normal limits  TYPE AND SCREEN  ABO/RH    Imaging Review US Ob Comp Less 14 Wks  09/21/2015  CLINICAL DATA:  Acute generalized abdominal pain, first trimester of pregnancy. EXAM: OBSTETRIC <14 WK [place] AND TRANSVAGINAL OB US TECHNIQUE: Both transabdominal and transvaginal ultrasound examinations were performed for complete evaluation of the gestation as well as the maternal uterus, adnexal regions, and pelvic cul-de-sac. Transvaginal technique was performed to assess early pregnancy. COMPARISON:  Ultrasound of January 21, 2015. FINDINGS: Intrauterine gestational sac: Single. Yolk sac:  Visualized. Embryo:  Visualized. Cardiac Activity: Visualized. Heart Rate: 110  bpm CRL: 6.5 mm 6 w 3 d [place] [facility]: May 13, 2016. Subchorionic hemorrhage:  None visualized. Maternal uterus/adnexae: Probable corpus luteum cyst seen in right ovary. Left ovary appears normal. No free fluid is noted. IMPRESSION: Single live intrauterine gestation of 6 weeks 3 days. Electronically Signed   By: Lupita Raider, M.D.   On:  09/21/2015 13:51   US Ob Transvaginal  09/21/2015  CLINICAL DATA:  Acute generalized abdominal pain, first trimester of pregnancy. EXAM: OBSTETRIC <14 WK [place] AND TRANSVAGINAL OB US TECHNIQUE: Both transabdominal and transvaginal ultrasound examinations were performed for complete evaluation of the gestation as well as the maternal uterus, adnexal regions, and pelvic cul-de-sac. Transvaginal technique was performed to assess early pregnancy. COMPARISON:  Ultrasound of January 21, 2015. FINDINGS: Intrauterine gestational sac: Single. Yolk sac:  Visualized. Embryo:  Visualized. Cardiac Activity: Visualized. Heart Rate: 110  bpm CRL: 6.5 mm 6 w 3 d [place] [facility]: May 13, 2016. Subchorionic hemorrhage:  None visualized. Maternal uterus/adnexae: Probable corpus luteum cyst seen in right ovary. Left ovary appears normal. No free fluid is noted. IMPRESSION: Single live intrauterine gestation of 6 weeks 3 days. Electronically Signed   By: Lupita Raider, M.D.   On: 09/21/2015  13:51   I have personally reviewed and evaluated these images and lab results as part of my medical decision-making.    MDM   Final diagnoses:  Positive pregnancy test    The patient will be referred to [facility] hospital clinic.  Advised her that everything is stable with the pregnancy at this point, she will need to return here as needed  Charlestine Night, PA-C 09/21/15 [address] PA-C 09/21/15 1418  Linwood Dibbles, MD 09/21/15 1424

## 2015-09-29 ENCOUNTER — Encounter (HOSPITAL_BASED_OUTPATIENT_CLINIC_OR_DEPARTMENT_OTHER): Payer: Self-pay | Admitting: Emergency Medicine

## 2015-09-29 ENCOUNTER — Encounter (HOSPITAL_BASED_OUTPATIENT_CLINIC_OR_DEPARTMENT_OTHER): Payer: Self-pay | Admitting: *Deleted

## 2015-09-29 ENCOUNTER — Emergency Department (HOSPITAL_BASED_OUTPATIENT_CLINIC_OR_DEPARTMENT_OTHER)
Admission: EM | Admit: 2015-09-29 | Discharge: 2015-09-29 | Disposition: A | Discharge status: Home / Self Care | Payer: Self-pay | Attending: Emergency Medicine | Admitting: Emergency Medicine

## 2015-09-29 DIAGNOSIS — I10 Essential (primary) hypertension: Secondary | ICD-10-CM | POA: Insufficient documentation

## 2015-09-29 DIAGNOSIS — Z87891 Personal history of nicotine dependence: Secondary | ICD-10-CM | POA: Insufficient documentation

## 2015-09-29 DIAGNOSIS — J45909 Unspecified asthma, uncomplicated: Secondary | ICD-10-CM | POA: Insufficient documentation

## 2015-09-29 DIAGNOSIS — K0889 Other specified disorders of teeth and supporting structures: Secondary | ICD-10-CM | POA: Insufficient documentation

## 2015-09-29 DIAGNOSIS — O99611 Diseases of the digestive system complicating pregnancy, first trimester: Secondary | ICD-10-CM | POA: Insufficient documentation

## 2015-09-29 DIAGNOSIS — Z3A01 Less than 8 weeks gestation of pregnancy: Secondary | ICD-10-CM | POA: Insufficient documentation

## 2015-09-29 MED ORDER — ONDANSETRON 4 MG PO TBDP
4.0000 mg | ORAL_TABLET | Freq: Once | ORAL | Status: AC
  Administered 2015-09-29: 4 mg via ORAL
  Filled 2015-09-29: qty 1

## 2015-09-29 MED ORDER — LIDOCAINE VISCOUS 2 % MT SOLN
15.0000 mL | Freq: Once | OROMUCOSAL | Status: AC
  Administered 2015-09-29: 15 mL via OROMUCOSAL
  Filled 2015-09-29: qty 15

## 2015-09-29 MED ORDER — DIPHENHYDRAMINE HCL 25 MG PO CAPS
25.0000 mg | ORAL_CAPSULE | Freq: Once | ORAL | Status: DC
  Filled 2015-09-29: qty 1

## 2015-09-29 MED ORDER — PENICILLIN V POTASSIUM 500 MG PO TABS: 500.0000 mg | ORAL_TABLET | Freq: Four times a day (QID) | ORAL | Status: AC

## 2015-09-29 MED ORDER — AMOXICILLIN 500 MG PO CAPS
1000.0000 mg | ORAL_CAPSULE | Freq: Once | ORAL | Status: AC
  Administered 2015-09-29: 1000 mg via ORAL
  Filled 2015-09-29: qty 2

## 2015-09-29 MED ORDER — OXYCODONE-ACETAMINOPHEN 5-325 MG PO TABS
1.0000 | ORAL_TABLET | Freq: Once | ORAL | Status: DC
  Filled 2015-09-29: qty 1

## 2015-09-29 MED ORDER — BUPIVACAINE HCL 0.5 % IJ SOLN: 10.0000 mL | Freq: Once | INTRAMUSCULAR | Status: DC

## 2015-09-29 MED ORDER — ACETAMINOPHEN 500 MG PO TABS
1000.0000 mg | ORAL_TABLET | Freq: Once | ORAL | Status: AC
  Administered 2015-09-29: 1000 mg via ORAL
  Filled 2015-09-29: qty 2

## 2015-09-29 MED ORDER — BUPIVACAINE-EPINEPHRINE (PF) 0.5% -1:200000 IJ SOLN
INTRAMUSCULAR | Status: AC
  Filled 2015-09-29: qty 1.8

## 2015-09-29 MED ORDER — BUPIVACAINE HCL 0.5 % IJ SOLN
10.0000 mL | Freq: Once | INTRAMUSCULAR | Status: AC
  Administered 2015-09-29: 10 mL
  Filled 2015-09-29: qty 1

## 2015-09-29 NOTE — Discharge Instructions (Signed)
Keep scheduled appointment with dentist.

## 2015-09-29 NOTE — ED Provider Notes (Signed)
CSN: [id number]     Arrival date & time 09/29/15  1356 History   First MD Initiated Contact with Patient 09/29/15 1459     Chief Complaint  Patient presents with  . Dental Pain     (Consider location/radiation/quality/duration/timing/severity/associated sxs/prior Treatment) Patient is a 25 y.o. female presenting with tooth pain. The history is provided by the patient and medical records. No language interpreter was used.  Dental Pain Associated symptoms: no fever, no headaches and no neck pain    Brittany Cordova is a 25 y.o. female  who presents to the Emergency Department complaining of constant, throbbing right lower dental pain which began yesterday. Patient states one similar episode approximately a year ago where she was seen in ED. Nerve block performed and amoxil given. Patient states she followed up with dentist shortly after where they told her this same tooth needed to be extracted, however, due to her blood pressure at that time, they needed to wait. She has not followed up with dentist since that encounter. Tylenol taken prior to arrival with little relief. Denies shortness of breath, difficulty swallowing, fever, neck pain.   Of note, patient is 7 weeks, 3 days pregnant via U/S performed at [facility] ED.   Past Medical History  Diagnosis Date  . Dermatitis due to food taken internally   . 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 Anemia, unspecified   . Allergic rhinitis, cause unspecified   . Amenorrhea     on BC hormone inj  . 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 Asthma   . Mass     heart  . Hypertension    Past Surgical History  Procedure Laterality Date  . Wisdom tooth extraction     Family History  Problem Relation Age of Onset  . Allergies Father     tomatoes  . Asthma Mother   . Diabetes Other     grandmother  . Cancer Paternal Grandmother   . Cancer Maternal Uncle     [employer]  . Cancer Other     great aunts   Social History  Substance Use  Topics  . Smoking status: Former Smoker -- 1 years    Types: Cigars    Quit date: 08/19/2010  . Smokeless tobacco: Never Used  . Alcohol Use: No   OB History    Gravida Para Term Preterm AB TAB SAB Ectopic Multiple Living       Review of Systems  Constitutional: Negative for fever and chills.  HENT: Positive for dental problem. Negative for trouble swallowing.   Eyes: Negative for visual disturbance.  Respiratory: Negative for cough and shortness of breath.   Cardiovascular: Negative.   Gastrointestinal: Negative for abdominal pain.  Genitourinary: Negative for dysuria.  Musculoskeletal: Negative for neck pain.  Skin: Negative for rash.  Neurological: Negative for headaches.      Allergies  Ibuprofen  Home Medications   Prior to Admission medications   Medication Sig Start Date End Date Taking? Authorizing Provider  albuterol (PROVENTIL HFA;VENTOLIN HFA) 108 (90 Base) MCG/ACT inhaler Inhale 1 puff into the lungs every 6 (six) hours as needed for wheezing or shortness of breath.    Historical Provider, MD  ondansetron (ZOFRAN) 4 MG tablet Take 1 tablet (4 mg total) by mouth every 6 (six) hours. 09/21/15   Charlestine Night, PA-C  penicillin v potassium (VEETID) 500 MG tablet Take 1 tablet (500 mg total) by  mouth 4 (four) times daily. 09/29/15 10/06/15  Norma Montemurro Pilcher Brant Peets, PA-C   BP 134/78 mmHg  Pulse 63  Temp(Src) 97.7 F (36.5 C) (Oral)  Resp 18  Ht 5\' 3"  (1.6 m)  Wt 99.791 kg  BMI 38.98 kg/m2  SpO2 100%  LMP 07/07/2015 Physical Exam  Constitutional: She is oriented to person, place, and time. She appears well-developed and well-nourished.  Able to speak in full sentences. NAD.   HENT:  Head: Normocephalic and atraumatic.  Mouth/Throat:    Dental cavities and poor oral dentition noted, pain along tooth as depicted in image, midline uvula, no trismus, oropharynx moist and clear, no abscess noted, no oropharyngeal erythema or edema, neck supple  and no tenderness. Mild right sided facial edema.   Cardiovascular: Normal rate, regular rhythm, normal heart sounds and intact distal pulses.  Exam reveals no gallop and no friction rub.   No murmur heard. Pulmonary/Chest: Effort normal and breath sounds normal. No respiratory distress. She has no wheezes. She has no rales.  Abdominal: Soft. Bowel sounds are normal. She exhibits no distension and no mass. There is no tenderness. There is no rebound and no guarding.  Musculoskeletal: She exhibits no edema.  Neurological: She is alert and oriented to person, place, and time.  Skin: Skin is warm and dry.  Nursing note and vitals reviewed.   ED Course  Procedures (including critical care time)  NERVE BLOCK Performed by: Chase PicketJaime Pilcher Brittany Cordova Consent: Verbal consent obtained. Required items: required blood products, implants, devices, and special equipment available Time out: Immediately prior to procedure a "time out" was called to verify the correct patient, procedure, equipment, support staff and site/side marked as required. Indication: Pain relief Nerve block body site: inferior alveolar nerve; right  Preparation: Patient was prepped and draped in the usual sterile fashion. Needle gauge: 24 G Location technique: anatomical landmarks Local anesthetic: Marcaine 0.5%  Anesthetic total: 1.8 ml Outcome: pain improved Patient tolerance: Patient tolerated the procedure well with no immediate complications.  Labs Review Labs Reviewed - No data to display  Imaging Review No results found. I have personally reviewed and evaluated these images and lab results as part of my medical decision-making.   EKG Interpretation None      MDM   Final diagnoses:  Pain, dental   Patient with dentalgia. No abscess requiring immediate incision and drainage. Patient is afebrile, non toxic appearing, and swallowing secretions well. Exam not concerning for Ludwig's angina or pharyngeal abscess. Will  treat with PenVK. Nerve block performed in ED as dictated above. Patient tolerated procedure well and endorses adequate pain relief. She has an appointment with dentist in the morning, stating she came in today because she could not make it through the night 2/2 pain. I stressed the importance of keeping dental follow up appointment for ultimate management of dental pain. Patient voices understanding and is agreeable to plan.  [facility] Pilcher Maxxon Schwanke, PA-C 09/29/15 1617  Arby BarretteMarcy Pfeiffer, MD 09/30/15 2046

## 2015-09-29 NOTE — ED Notes (Signed)
Nurse first-pt seated in ED WR-[facility]-walked to bistro area with no distress earlier

## 2015-09-29 NOTE — ED Provider Notes (Signed)
Patient seen in ED by me six hours ago for the same complaint. Please see prior note. No pain medication was given because she was driving. Instead, I performed a dental nerve block at that time. After block, patient stated that pain was improved and her mouth felt numb. She now presents with mother whom she states is driving her home and informs me that she never felt numb and that the "nerve block didn't take".   Gen: afebrile, tearful, NAD HEENT: Airway patent Resp: no resp distress CV: RRR Abd: soft, NT, ND MsK: moving all extremities well Neuro: A&O x4  Due to + pregnancy, do not want to give narcotic pain medication. Dr. Donnald GarrePfeiffer performed dental block. Already given rx for ABX at last visit. Again encouraged patient to keep her scheduled appointment with the dentist which is tomorrow morning.   [facility] Pilcher Opie Maclaughlin, PA-C 09/30/15 0028  Arby BarretteMarcy Pfeiffer, MD 09/30/15 2056

## 2015-09-29 NOTE — ED Notes (Signed)
MD at bedside injecting pts mouth

## 2015-09-29 NOTE — Discharge Instructions (Signed)
You have a dental injury. It is very important that you get evaluated by a dentist as soon as possible. Keep your scheduled appointment with the dentist tomorrow. Take your full course of antibiotics.  Read the instructions below.  Eat a soft or liquid diet and rinse your mouth out after meals with warm water. You should see a dentist or return here at once if you have increased swelling, increased pain or uncontrolled bleeding from the site of your injury.  SEEK MEDICAL CARE IF:   You have increased pain not controlled with medicines.   You have swelling around your tooth, in your face or neck.   You have bleeding which starts, continues, or gets worse.   You have a fever >101  If you are unable to open your mouth

## 2015-09-29 NOTE — ED Notes (Addendum)
Pt reports dental pain on the right lower jaw x several days and right upper jaw. Patient was here this am  With the same pain and had a block to the lower, but reports that it did not work. Patient reports that she also took a tylenol. The patient is crying in pain in the triage room.

## 2015-09-29 NOTE — ED Notes (Signed)
Pt reports dental pain on the right lower jaw x several days.  Also reports [facility]. Denies vaginal discharge.

## 2015-12-27 ENCOUNTER — Encounter: Payer: Self-pay | Admitting: *Deleted

## 2015-12-27 ENCOUNTER — Encounter: Payer: Self-pay | Admitting: Obstetrics

## 2015-12-27 ENCOUNTER — Ambulatory Visit (INDEPENDENT_AMBULATORY_CARE_PROVIDER_SITE_OTHER): Payer: Medicaid Other | Admitting: Obstetrics

## 2015-12-27 VITALS — BP 135/83 | HR 94 | Temp 98.7°F | Wt 234.2 lb

## 2015-12-27 DIAGNOSIS — Z3492 Encounter for supervision of normal pregnancy, unspecified, second trimester: Secondary | ICD-10-CM

## 2015-12-27 DIAGNOSIS — Z3402 Encounter for supervision of normal first pregnancy, second trimester: Secondary | ICD-10-CM

## 2015-12-27 LAB — POCT URINALYSIS DIPSTICK
BILIRUBIN UA: NEGATIVE
Glucose, UA: NEGATIVE
Ketones, UA: NEGATIVE
Leukocytes, UA: NEGATIVE
NITRITE UA: NEGATIVE
PH UA: 6
PROTEIN UA: NEGATIVE
RBC UA: NEGATIVE
Spec Grav, UA: 1.015
Urobilinogen, UA: NEGATIVE

## 2015-12-27 MED ORDER — VITAFOL GUMMIES 3.33-0.333-34.8 MG PO CHEW: 3.0000 | CHEWABLE_TABLET | Freq: Every day | ORAL | 3 refills | Status: DC

## 2015-12-27 NOTE — Progress Notes (Signed)
Patient wants an [place]

## 2015-12-27 NOTE — Progress Notes (Signed)
Patient ID: Keturah Yerby, female   DOB: Jul 18, 1990, 25 y.o.   MRN: [id number] Subjective:    Fadia Brandon Scarbrough is being seen today for her first obstetrical visit.  This is not a planned pregnancy. She is at [redacted]w[redacted]d gestation. Her obstetrical history is significant for obesity. Relationship with FOB: significant other, not living together. Patient does intend to breast feed. Pregnancy history fully reviewed.  The information documented in the HPI was reviewed and verified.  Menstrual History: OB History    Gravida Para Term Preterm AB Living   1 0 0 0 0 0   SAB TAB Ectopic Multiple Live Births   0 0 0 0         Patient's last menstrual period was 07/31/2015 (approximate).    Past Medical History:  Diagnosis Date  . Allergic rhinitis, cause unspecified   . 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 Amenorrhea    on BC hormone inj  . Anemia, unspecified   . 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 Asthma   . Dermatitis due to food taken internally   . Hypertension   . Mass    heart    Past Surgical History:  Procedure Laterality Date  . WISDOM TOOTH EXTRACTION       (Not in a hospital admission) Allergies  Allergen Reactions  . Ibuprofen Anaphylaxis and Swelling    Social History  Substance Use Topics  . Smoking status: Former Smoker    Years: 1.00    Types: Cigars    Quit date: 08/19/2010  . Smokeless tobacco: Never Used  . Alcohol use No    Family History  Problem Relation Age of Onset  . Allergies Father     tomatoes  . Hypertension Father   . Asthma Mother   . Hypertension Mother   . Diabetes Other     grandmother  . Cancer Paternal Grandmother   . Cancer Maternal Uncle     [employer]  . Cancer Other     great aunts  . Diabetes Maternal Grandmother      Review of Systems Constitutional: negative for weight loss Gastrointestinal: negative for vomiting Genitourinary:negative for genital lesions and vaginal discharge and  dysuria Musculoskeletal:negative for back pain Behavioral/Psych: negative for abusive relationship, depression, illegal drug usage and tobacco use    Objective:    BP 135/83   Pulse 94   Temp 98.7 F (37.1 C)   Wt 234 lb 3.2 oz (106.2 kg)   LMP 07/31/2015 (Approximate)   BMI 41.49 kg/m  General Appearance:    Alert, cooperative, no distress, appears stated age  Head:    Normocephalic, without obvious abnormality, atraumatic  Eyes:    PERRL, conjunctiva/corneas clear, EOM's intact, fundi    benign, both eyes  Ears:    Normal TM's and external ear canals, both ears  Nose:   Nares normal, septum midline, mucosa normal, no drainage    or sinus tenderness  Throat:   Lips, mucosa, and tongue normal; teeth and gums normal  Neck:   Supple, symmetrical, trachea midline, no adenopathy;    thyroid:  no enlargement/tenderness/nodules; no carotid   bruit or JVD  Back:     Symmetric, no curvature, ROM normal, no CVA tenderness  Lungs:     Clear to auscultation bilaterally, respirations unlabored  Chest Wall:    No tenderness or deformity   Heart:    Regular rate and rhythm, S1 and S2 normal, no murmur, rub   or  gallop  Breast Exam:    No tenderness, masses, or nipple abnormality  Abdomen:     Soft, non-tender, bowel sounds active all four quadrants,    no masses, no organomegaly  Genitalia:    Normal female without lesion, discharge or tenderness  Extremities:   Extremities normal, atraumatic, no cyanosis or edema  Pulses:   2+ and symmetric all extremities  Skin:   Skin color, texture, turgor normal, no rashes or lesions  Lymph nodes:   Cervical, supraclavicular, and axillary nodes normal  Neurologic:   CNII-XII intact, normal strength, sensation and reflexes    throughout      Lab Review Urine pregnancy test Labs reviewed yes Radiologic studies reviewed no Assessment:    Pregnancy at 2052w2d weeks    H/O Hypertension.  Stop taking meds.   Plan:     Will probably need baseline 24  hour urine, BP meds and Baby ASA.  Will refer to MFM. Prenatal vitamins.  Counseling provided regarding continued use of seat belts, cessation of alcohol consumption, smoking or use of illicit drugs; infection precautions i.e., influenza/TDAP immunizations, toxoplasmosis,CMV, parvovirus, listeria and varicella; workplace safety, exercise during pregnancy; routine dental care, safe medications, sexual activity, hot tubs, saunas, pools, travel, caffeine use, fish and methlymercury, potential toxins, hair treatments, varicose veins Weight gain recommendations per IOM guidelines reviewed: underweight/BMI< 18.5--> gain 28 - 40 lbs; normal weight/BMI 18.5 - 24.9--> gain 25 - 35 lbs; overweight/BMI 25 - 29.9--> gain 15 - 25 lbs; obese/BMI >30->gain  11 - 20 lbs Problem list reviewed and updated. FIRST/CF mutation testing/NIPT/QUAD SCREEN/fragile X/Ashkenazi Jewish population testing/Spinal muscular atrophy discussed: requested. Role of ultrasound in pregnancy discussed; fetal survey: requested. Amniocentesis discussed: not indicated. VBAC calculator score: VBAC consent form provided No orders of the defined types were placed in this encounter.  Orders Placed This Encounter  Procedures  . POCT urinalysis dipstick    Follow up in 4 weeks.

## 2015-12-29 LAB — HEMOGLOBINOPATHY EVALUATION
HGB C: 0 %
HGB S: 0 %
Hemoglobin A2 Quantitation: 2.4 % (ref 0.7–3.1)
Hemoglobin F Quantitation: 0 % (ref 0.0–2.0)
Hgb A: 97.6 % (ref 94.0–98.0)

## 2015-12-29 LAB — URINE CULTURE, OB REFLEX

## 2015-12-29 LAB — PRENATAL PROFILE I(LABCORP)
Antibody Screen: NEGATIVE
BASOS: 0 %
Basophils Absolute: 0 10*3/uL (ref 0.0–0.2)
EOS (ABSOLUTE): 0.2 10*3/uL (ref 0.0–0.4)
Eos: 2 %
HEMATOCRIT: 32.4 % — AB (ref 34.0–46.6)
HEMOGLOBIN: 10.3 g/dL — AB (ref 11.1–15.9)
Hepatitis B Surface Ag: NEGATIVE
Immature Grans (Abs): 0.1 10*3/uL (ref 0.0–0.1)
Immature Granulocytes: 1 %
LYMPHS ABS: 3.5 10*3/uL — AB (ref 0.7–3.1)
Lymphs: 29 %
MCH: 24.9 pg — AB (ref 26.6–33.0)
MCHC: 31.8 g/dL (ref 31.5–35.7)
MCV: 79 fL (ref 79–97)
MONOS ABS: 0.6 10*3/uL (ref 0.1–0.9)
Monocytes: 5 %
NEUTROS ABS: 7.5 10*3/uL — AB (ref 1.4–7.0)
Neutrophils: 63 %
Platelets: 252 10*3/uL (ref 150–379)
RBC: 4.13 x10E6/uL (ref 3.77–5.28)
RDW: 16 % — AB (ref 12.3–15.4)
RPR: NONREACTIVE
Rh Factor: NEGATIVE
Rubella Antibodies, IGG: <0.9 index — ABNORMAL LOW (ref >0.99)
WBC: 11.9 10*3/uL — AB (ref 3.4–10.8)

## 2015-12-29 LAB — CULTURE, OB URINE

## 2015-12-29 LAB — VITAMIN D 25 HYDROXY (VIT D DEFICIENCY, FRACTURES): VIT D 25 HYDROXY: 9 ng/mL — AB (ref 30.0–100.0)

## 2015-12-29 LAB — HIV ANTIBODY (ROUTINE TESTING W REFLEX): HIV SCREEN 4TH GENERATION: NONREACTIVE

## 2015-12-29 LAB — VARICELLA ZOSTER ANTIBODY, IGG: VARICELLA: 727 {index} (ref >165)

## 2015-12-30 LAB — NUSWAB VG+, CANDIDA 6SP
CANDIDA ALBICANS, NAA: POSITIVE — AB
CANDIDA GLABRATA, NAA: NEGATIVE
CANDIDA TROPICALIS, NAA: NEGATIVE
Candida krusei, NAA: NEGATIVE
Candida lusitaniae, NAA: NEGATIVE
Candida parapsilosis, NAA: NEGATIVE
Chlamydia trachomatis, NAA: NEGATIVE
Neisseria gonorrhoeae, NAA: NEGATIVE
TRICH VAG BY NAA: POSITIVE — AB

## 2015-12-31 LAB — PAP IG W/ RFLX HPV ASCU: PAP Smear Comment: 0

## 2016-01-02 ENCOUNTER — Ambulatory Visit (INDEPENDENT_AMBULATORY_CARE_PROVIDER_SITE_OTHER): Payer: Medicaid Other

## 2016-01-02 ENCOUNTER — Other Ambulatory Visit: Payer: Self-pay | Admitting: Obstetrics

## 2016-01-02 DIAGNOSIS — Z36 Encounter for antenatal screening of mother: Secondary | ICD-10-CM | POA: Diagnosis not present

## 2016-01-02 DIAGNOSIS — B3731 Acute candidiasis of vulva and vagina: Secondary | ICD-10-CM

## 2016-01-02 DIAGNOSIS — A5901 Trichomonal vulvovaginitis: Secondary | ICD-10-CM

## 2016-01-02 DIAGNOSIS — Z3402 Encounter for supervision of normal first pregnancy, second trimester: Secondary | ICD-10-CM

## 2016-01-02 DIAGNOSIS — B373 Candidiasis of vulva and vagina: Secondary | ICD-10-CM

## 2016-01-02 MED ORDER — METRONIDAZOLE 500 MG PO TABS: 2000.0000 mg | ORAL_TABLET | Freq: Once | ORAL | 0 refills | Status: AC

## 2016-01-02 MED ORDER — TERCONAZOLE 0.8 % VA CREA: 1.0000 | TOPICAL_CREAM | Freq: Every day | VAGINAL | 0 refills | Status: DC

## 2016-01-04 ENCOUNTER — Telehealth: Payer: Self-pay | Admitting: *Deleted

## 2016-01-04 NOTE — Telephone Encounter (Signed)
Patient is aware of lab results and medication has been picked up at her pharmacy.Aware that she needs a repeat PAP postpartum.

## 2016-01-05 ENCOUNTER — Encounter: Payer: Self-pay | Admitting: *Deleted

## 2016-01-24 ENCOUNTER — Ambulatory Visit (INDEPENDENT_AMBULATORY_CARE_PROVIDER_SITE_OTHER): Payer: Medicaid Other | Admitting: Obstetrics

## 2016-01-24 ENCOUNTER — Encounter: Payer: Self-pay | Admitting: Obstetrics

## 2016-01-24 VITALS — BP 136/84 | HR 88 | Temp 98.4°F | Wt 244.8 lb

## 2016-01-24 DIAGNOSIS — Z3493 Encounter for supervision of normal pregnancy, unspecified, third trimester: Secondary | ICD-10-CM | POA: Diagnosis not present

## 2016-01-24 NOTE — Progress Notes (Signed)
Subjective:    Brittany Cordova is a 25 y.o. female being seen today for her obstetrical visit. She is at 4250w2d gestation. Patient reports: no complaints . Fetal movement: normal.  Problem List Items Addressed This Visit    None    Visit Diagnoses   None.    Patient Active Problem List   Diagnosis Date Noted  . Obstructive sleep apnea 11/01/2010  . Pruritus 09/30/2010  . ALLERGIC RHINITIS 06/12/2008  . ANEMIA 05/31/2008  . ANGIOEDEMA 05/31/2008   Objective:    BP 136/84   Pulse 88   Temp 98.4 F (36.9 C)   Wt 244 lb 12.8 oz (111 kg)   LMP 07/31/2015 (Approximate)   BMI 43.36 kg/m  FHT: 150 BPM  Uterine Size: size equals dates     Assessment:    Pregnancy @ 6650w2d    Plan:    OBGCT: ordered for next visit. Signs and symptoms of preterm labor: discussed.  Labs, problem list reviewed and updated 2 hr GTT planned Follow up in 3 weeks.  Patient ID: Brittany Cordova, female   DOB: April 05, 1991, 25 y.o.   MRN: [id number]

## 2016-02-13 DIAGNOSIS — O0990 Supervision of high risk pregnancy, unspecified, unspecified trimester: Secondary | ICD-10-CM | POA: Insufficient documentation

## 2016-02-13 LAB — AFP, QUAD SCREEN
DIA Mom Value: 2.55
DIA VALUE (EIA): 408.7 pg/mL
DSR (By Age)    1 IN: 943
DSR (SECOND TRIMESTER) 1 IN: 1113
GESTATIONAL AGE AFP: 20.3 wk
MATERNAL AGE AT EDD: 26.7 a
MSAFP MOM: 2.24
MSAFP: 111.2 ng/mL
MSHCG Mom: 1.39
MSHCG: 24905 m[IU]/mL
OSB RISK: 963
TEST RESULTS AFP: NEGATIVE
UE3 MOM: 0.67
WEIGHT: 234 [lb_av]
uE3 Value: 1.17 ng/mL

## 2016-02-14 ENCOUNTER — Other Ambulatory Visit: Payer: Medicaid Other

## 2016-02-14 ENCOUNTER — Encounter: Payer: Self-pay | Admitting: Obstetrics

## 2016-02-14 ENCOUNTER — Ambulatory Visit (INDEPENDENT_AMBULATORY_CARE_PROVIDER_SITE_OTHER): Payer: Medicaid Other | Admitting: Obstetrics

## 2016-02-14 VITALS — BP 135/85 | HR 89 | Wt 239.0 lb

## 2016-02-14 DIAGNOSIS — O10019 Pre-existing essential hypertension complicating pregnancy, unspecified trimester: Secondary | ICD-10-CM | POA: Diagnosis not present

## 2016-02-14 DIAGNOSIS — O360190 Maternal care for anti-D [Rh] antibodies, unspecified trimester, not applicable or unspecified: Secondary | ICD-10-CM | POA: Diagnosis not present

## 2016-02-14 DIAGNOSIS — Z3493 Encounter for supervision of normal pregnancy, unspecified, third trimester: Secondary | ICD-10-CM | POA: Diagnosis not present

## 2016-02-14 DIAGNOSIS — O26899 Other specified pregnancy related conditions, unspecified trimester: Secondary | ICD-10-CM

## 2016-02-14 DIAGNOSIS — Z6791 Unspecified blood type, Rh negative: Secondary | ICD-10-CM | POA: Insufficient documentation

## 2016-02-14 DIAGNOSIS — Z3480 Encounter for supervision of other normal pregnancy, unspecified trimester: Secondary | ICD-10-CM

## 2016-02-14 MED ORDER — RHO D IMMUNE GLOBULIN 1500 UNITS IM SOSY: 1500.0000 [IU] | PREFILLED_SYRINGE | Freq: Once | INTRAMUSCULAR | Status: DC

## 2016-02-14 MED ORDER — RHO D IMMUNE GLOBULIN 1500 UNIT/2ML IJ SOSY
300.0000 ug | PREFILLED_SYRINGE | Freq: Once | INTRAMUSCULAR | Status: AC
  Administered 2016-02-14: 300 ug via INTRAMUSCULAR

## 2016-02-14 NOTE — Progress Notes (Signed)
Subjective:    Brittany Cordova is a 25 y.o. female being seen today for her obstetrical visit. She is at 5274w2d gestation. Patient reports no complaints. Fetal movement: normal.  Problem List Items Addressed This Visit    Rh negative, antepartum   Supervision of normal pregnancy, antepartum   Relevant Orders   POCT urinalysis dipstick   Glucose Tolerance, 2 Hours w/1 Hour   CBC   HIV antibody   RPR    Other Visit Diagnoses   None.    Patient Active Problem List   Diagnosis Date Noted  . Rh negative, antepartum 02/14/2016  . Supervision of normal pregnancy, antepartum 02/13/2016  . Obstructive sleep apnea 11/01/2010  . Pruritus 09/30/2010  . ALLERGIC RHINITIS 06/12/2008  . ANEMIA 05/31/2008  . ANGIOEDEMA 05/31/2008   Objective:    BP 135/85   Pulse 89   Wt 239 lb (108.4 kg)   LMP 07/31/2015 (Approximate)   BMI 42.34 kg/m  FHT:  150 BPM  Uterine Size: size equals dates  Presentation: unsure     Assessment:    Pregnancy @ 7074w2d weeks   Plan:     labs reviewed, problem list updated Consent signed. GBS sent TDAP offered  Rhogam given for RH negative Pediatrician: discussed. Infant feeding: plans to breastfeed. Maternity leave: discussed. Cigarette smoking: former smoker. Orders Placed This Encounter  Procedures  . Glucose Tolerance, 2 Hours w/1 Hour  . CBC  . HIV antibody  . RPR  . POCT urinalysis dipstick   Meds ordered this encounter  Medications  . Phenylephrine-DM-GG-APAP (TYLENOL COLD/FLU SEVERE PO)    Sig: Take by mouth.  . DISCONTD: Rho D Immune Globulin SOSY 1,500 Units  . rho (d) immune globulin (RHIG/RHOPHYLAC) injection 300 mcg   Follow up in 2 Weeks.   Patient ID: Leone BrandLapoesche Nicole Seabury, female   DOB: 19-May-1991, 25 y.o.   MRN: [id number]

## 2016-02-15 LAB — CBC
HEMOGLOBIN: 10.4 g/dL — AB (ref 11.1–15.9)
Hematocrit: 31.5 % — ABNORMAL LOW (ref 34.0–46.6)
MCH: 26.7 pg (ref 26.6–33.0)
MCHC: 33 g/dL (ref 31.5–35.7)
MCV: 81 fL (ref 79–97)
Platelets: 257 10*3/uL (ref 150–379)
RBC: 3.9 x10E6/uL (ref 3.77–5.28)
RDW: 14.8 % (ref 12.3–15.4)
WBC: 11.6 10*3/uL — ABNORMAL HIGH (ref 3.4–10.8)

## 2016-02-15 LAB — HIV ANTIBODY (ROUTINE TESTING W REFLEX): HIV SCREEN 4TH GENERATION: NONREACTIVE

## 2016-02-15 LAB — RPR: RPR Ser Ql: NONREACTIVE

## 2016-02-15 LAB — GLUCOSE TOLERANCE, 2 HOURS W/ 1HR
GLUCOSE, 1 HOUR: 132 mg/dL (ref 65–179)
GLUCOSE, FASTING: 76 mg/dL (ref 65–91)
Glucose, 2 hour: 110 mg/dL (ref 65–152)

## 2016-02-26 ENCOUNTER — Encounter: Payer: Self-pay | Admitting: *Deleted

## 2016-02-26 ENCOUNTER — Ambulatory Visit (HOSPITAL_COMMUNITY)
Admission: RE | Admit: 2016-02-26 | Discharge: 2016-02-26 | Disposition: A | Discharge status: Home / Self Care | Payer: Medicaid Other | Source: Ambulatory Visit | Attending: Obstetrics | Admitting: Obstetrics

## 2016-02-26 ENCOUNTER — Other Ambulatory Visit: Payer: Self-pay | Admitting: Obstetrics

## 2016-02-26 DIAGNOSIS — Z363 Encounter for antenatal screening for malformations: Secondary | ICD-10-CM | POA: Diagnosis present

## 2016-02-26 DIAGNOSIS — O10013 Pre-existing essential hypertension complicating pregnancy, third trimester: Secondary | ICD-10-CM | POA: Insufficient documentation

## 2016-02-26 DIAGNOSIS — Z3A29 29 weeks gestation of pregnancy: Secondary | ICD-10-CM

## 2016-02-26 DIAGNOSIS — Z6791 Unspecified blood type, Rh negative: Secondary | ICD-10-CM

## 2016-02-26 DIAGNOSIS — O10019 Pre-existing essential hypertension complicating pregnancy, unspecified trimester: Secondary | ICD-10-CM

## 2016-02-26 DIAGNOSIS — O99213 Obesity complicating pregnancy, third trimester: Secondary | ICD-10-CM | POA: Diagnosis not present

## 2016-02-26 DIAGNOSIS — O26893 Other specified pregnancy related conditions, third trimester: Secondary | ICD-10-CM | POA: Insufficient documentation

## 2016-03-05 ENCOUNTER — Encounter: Payer: Self-pay | Admitting: Obstetrics

## 2016-03-05 ENCOUNTER — Ambulatory Visit (INDEPENDENT_AMBULATORY_CARE_PROVIDER_SITE_OTHER): Payer: Medicaid Other | Admitting: Obstetrics

## 2016-03-05 VITALS — BP 132/82 | HR 96 | Wt 238.6 lb

## 2016-03-05 DIAGNOSIS — Z3493 Encounter for supervision of normal pregnancy, unspecified, third trimester: Secondary | ICD-10-CM

## 2016-03-05 NOTE — Progress Notes (Signed)
Subjective:    Brittany Cordova is a 25 y.o. female being seen today for her obstetrical visit. She is at 4428w1d gestation. Patient reports no complaints. Fetal movement: normal.  Problem List Items Addressed This Visit    None    Visit Diagnoses    Prenatal care in third trimester    -  Primary     Patient Active Problem List   Diagnosis Date Noted  . Rh negative, antepartum 02/14/2016  . Supervision of normal pregnancy, antepartum 02/13/2016  . Obstructive sleep apnea 11/01/2010  . Pruritus 09/30/2010  . ALLERGIC RHINITIS 06/12/2008  . ANEMIA 05/31/2008  . ANGIOEDEMA 05/31/2008   Objective:    BP 132/82   Pulse 96   Wt 238 lb 9.6 oz (108.2 kg)   LMP 07/31/2015 (Approximate)   BMI 42.27 kg/m   FHT:  150 BPM  Uterine Size: size equals dates  Presentation: unsure     Assessment:    Pregnancy @ 7928w1d weeks   Plan:     labs reviewed, problem list updated Consent signed. GBS sent TDAP offered  Rhogam given for RH negative Pediatrician: discussed. Infant feeding: plans to breastfeed. Maternity leave: discussed. Cigarette smokinformer smokerformer smoker. No orders of the defined types were placed in this encounter.  No orders of the defined types were placed in this encounter.  Follow up in 2 Weeks.   Patient is in office, states she is feeling good, reports good fetal movement.

## 2016-03-20 ENCOUNTER — Ambulatory Visit (INDEPENDENT_AMBULATORY_CARE_PROVIDER_SITE_OTHER): Payer: Medicaid Other | Admitting: Obstetrics & Gynecology

## 2016-03-20 DIAGNOSIS — Z3493 Encounter for supervision of normal pregnancy, unspecified, third trimester: Secondary | ICD-10-CM

## 2016-03-20 DIAGNOSIS — Z3490 Encounter for supervision of normal pregnancy, unspecified, unspecified trimester: Secondary | ICD-10-CM

## 2016-03-20 NOTE — Patient Instructions (Addendum)
Thinking About Doren Custard???  You must attend a Doren Custard class at [facility]  3rd Wednesday of every month from 7-9pm  Free  [employer] by calling [phone] or online at [email or web address]  Bring [place] the certificate from the class  Doren Custard supplies needed for [facility] patients:  Our practice has a Heritage manager in a Box tub at the hospital that you can borrow  You will need to purchase an accessory kit that has all needed supplies through [facility] [phone]) or online $175.00  Or you can purchase the supplies separately: o Single-use disposable tub liner for Birth Pool in a Box (REGULAR size) o New garden hose labeled "lead-free", "suitable for drinking water", o Electric drain pump to remove water (We recommend 792 gallon per hour or greater pump.)  o  "non-toxic" OR "water potable" o Garden hose to remove the dirty water o Fish net o Bathing suit top (optional) o Long-handled mirror (optional)  [employer] sells tubs for ~ $120 if you would rather purchase your own tub.  They also sell accessories, liners.    Www.waterbirthsolutions.com for tub purchases and supplies  [employer] (www.thelaborladies.com) $275 for tub rental/set-up & take down/kit   [employer] information regarding doulas (labor support) who provide pool rentals:  IdentityList.se.htm   [employer] (www.thelaborladies.com)  IdentityList.se.htm   Things that would prevent you from having a waterbirth:  Premature, <37wks  Previous cesarean birth  Presence of thick meconium-stained fluid  Multiple gestation (Twins, triplets, etc.)  Uncontrolled diabetes or gestational diabetes requiring medication  Hypertension  Heavy vaginal bleeding  Non-reassuring fetal heart rate  Active infection (MRSA, etc.)  If your labor has to be induced and induction method requires continuous monitoring of the  baby's heart rate  Other risks/issues identified by your obstetrical provider

## 2016-03-20 NOTE — Progress Notes (Signed)
Pt would like to discuss water birth, pt states she want natural delivery with no pain meds.

## 2016-03-20 NOTE — Progress Notes (Signed)
   PRENATAL VISIT NOTE  Subjective:  Brittany Cordova is a 25 y.o. G1P0000 at 810w2d being seen today for ongoing prenatal care.  She is currently monitored for the following issues for this low-risk pregnancy and has ANEMIA; ALLERGIC RHINITIS; ANGIOEDEMA; Pruritus; Obstructive sleep apnea; Supervision of normal pregnancy, antepartum; and Rh negative, antepartum on her problem list.  Patient reports no complaints. Desires information about waterbirth.  Contractions: Not present. Vag. Bleeding: None.  Movement: Present. Denies leaking of fluid.   The following portions of the patient's history were reviewed and updated as appropriate: allergies, current medications, past family history, past medical history, past social history, past surgical history and problem list. Problem list updated.  Objective:   Vitals:   03/20/16 0958  BP: 133/88  Pulse: 92    Fetal Status: Fetal Heart Rate (bpm): 145 Fundal Height: 34 cm Movement: Present     General:  Alert, oriented and cooperative. Patient is in no acute distress.  Skin: Skin is warm and dry. No rash noted.   Cardiovascular: Normal heart rate noted  Respiratory: Normal respiratory effort, no problems with respiration noted  Abdomen: Soft, gravid, appropriate for gestational age. Pain/Pressure: Present     Pelvic:  Cervical exam deferred        Extremities: Normal range of motion.     Mental Status: Normal mood and affect. Normal behavior. Normal judgment and thought content.   Assessment and Plan:  Pregnancy: G1P0000 at 6210w2d  1. Encounter for supervision of normal pregnancy, antepartum, unspecified gravidity [place] information given, she will return next visit to sign consent with CNM. Preterm labor symptoms and general obstetric precautions including but not limited to vaginal bleeding, contractions, leaking of fluid and fetal movement were reviewed in detail with the patient. Please refer to After Visit Summary for other  counseling recommendations.  Return in about 2 weeks (around 04/03/2016) for OB Visit with Orvilla Cornwallachelle Denney (sign waterbirth consent).  Tereso NewcomerUgonna A Tacoya Altizer, MD

## 2016-04-03 ENCOUNTER — Ambulatory Visit (INDEPENDENT_AMBULATORY_CARE_PROVIDER_SITE_OTHER): Payer: Medicaid Other | Admitting: Certified Nurse Midwife

## 2016-04-03 ENCOUNTER — Encounter: Payer: Self-pay | Admitting: *Deleted

## 2016-04-03 VITALS — BP 141/85 | HR 96 | Wt 247.0 lb

## 2016-04-03 DIAGNOSIS — O10913 Unspecified pre-existing hypertension complicating pregnancy, third trimester: Secondary | ICD-10-CM

## 2016-04-03 DIAGNOSIS — Z2839 Other underimmunization status: Secondary | ICD-10-CM | POA: Insufficient documentation

## 2016-04-03 DIAGNOSIS — Z3400 Encounter for supervision of normal first pregnancy, unspecified trimester: Secondary | ICD-10-CM

## 2016-04-03 DIAGNOSIS — Z3403 Encounter for supervision of normal first pregnancy, third trimester: Secondary | ICD-10-CM

## 2016-04-03 DIAGNOSIS — O9989 Other specified diseases and conditions complicating pregnancy, childbirth and the puerperium: Secondary | ICD-10-CM

## 2016-04-03 DIAGNOSIS — O10919 Unspecified pre-existing hypertension complicating pregnancy, unspecified trimester: Secondary | ICD-10-CM

## 2016-04-03 DIAGNOSIS — O09899 Supervision of other high risk pregnancies, unspecified trimester: Secondary | ICD-10-CM

## 2016-04-03 DIAGNOSIS — Z283 Underimmunization status: Secondary | ICD-10-CM | POA: Insufficient documentation

## 2016-04-03 NOTE — Progress Notes (Signed)
Pt is having wrist pain.

## 2016-04-03 NOTE — Patient Instructions (Addendum)
Hypertension During Pregnancy Hypertension is also called high blood pressure. High blood pressure means that the force of your blood moving in your body is too strong. When you are pregnant, this condition should be watched carefully. It can cause problems for you and your baby. Follow these instructions at home: Eating and drinking  Drink enough fluid to keep your pee (urine) clear or pale yellow.  Eat healthy foods that are low in salt (sodium).  Do not add salt to your food.  Check labels on foods and drinks to see much salt is in them. Look on the label where you see "Sodium." Lifestyle  Do not use any products that contain nicotine or tobacco, such as cigarettes and e-cigarettes. If you need help quitting, ask your doctor.  Do not use alcohol.  Avoid caffeine.  Avoid stress. Rest and get plenty of sleep. General instructions  Take over-the-counter and prescription medicines only as told by your doctor.  While lying down, lie on your left side. This keeps pressure off your baby.  While sitting or lying down, raise (elevate) your feet. Try putting some pillows under your lower legs.  Exercise regularly. Ask your doctor what kinds of exercise are best for you.  Keep all prenatal and follow-up visits as told by your doctor. This is important. Contact a doctor if:  You have symptoms that your doctor told you to watch for, such as:  Fever.  Throwing up (vomiting).  Headache. Get help right away if:  You have very bad pain in your belly (abdomen).  You are throwing up, and this does not get better with treatment.  You suddenly get swelling in your hands, ankles, or face.  You gain 4 lb (1.8 kg) or more in 1 week.  You get bleeding from your vagina.  You have blood in your pee.  You do not feel your baby moving as much as normal.  You have a change in vision.  You have muscle twitching or sudden tightening (spasms).  You have trouble breathing.  Your lips  or fingernails turn blue. This information is not intended to replace advice given to you by your health care provider. Make sure you discuss any questions you have with your health care provider. Document Released: 06/08/2010 Document Revised: 01/16/2016 Document Reviewed: 01/16/2016 Elsevier Interactive Patient Education  2017 [employer]  Carpal Tunnel Syndrome Carpal tunnel syndrome is a condition that causes pain in your hand and arm. The carpal tunnel is a narrow area located on the palm side of your wrist. Repeated wrist motion or certain diseases may cause swelling within the tunnel. This swelling pinches the main nerve in the wrist (median nerve). What are the causes? This condition may be caused by:  Repeated wrist motions.  Wrist injuries.  Arthritis.  A cyst or tumor in the carpal tunnel.  Fluid buildup during pregnancy. Sometimes the cause of this condition is not known. What increases the risk? This condition is more likely to develop in:  People who have jobs that cause them to repeatedly move their wrists in the same motion, such as Health visitorbutchers and cashiers.  Women.  People with certain conditions, such as:  Diabetes.  Obesity.  An underactive thyroid (hypothyroidism).  Kidney failure. What are the signs or symptoms? Symptoms of this condition include:  A tingling feeling in your fingers, especially in your thumb, index, and middle fingers.  Tingling or numbness in your hand.  An aching feeling in your entire arm, especially when your  wrist and elbow are bent for long periods of time.  Wrist pain that goes up your arm to your shoulder.  Pain that goes down into your palm or fingers.  A weak feeling in your hands. You may have trouble grabbing and holding items. Your symptoms may feel worse during the night. How is this diagnosed? This condition is diagnosed with a medical history and physical exam. You may also have tests, including:  An  electromyogram (EMG). This test measures electrical signals sent by your nerves into the muscles.  X-rays. How is this treated? Treatment for this condition includes:  Lifestyle changes. It is important to stop doing or modify the activity that caused your condition.  Physical or occupational therapy.  Medicines for pain and inflammation. This may include medicine that is injected into your wrist.  A wrist splint.  Surgery. Follow these instructions at home: If you have a splint:  Wear it as told by your health care provider. Remove it only as told by your health care provider.  Loosen the splint if your fingers become numb and tingle, or if they turn cold and blue.  Keep the splint clean and dry. General instructions  Take over-the-counter and prescription medicines only as told by your health care provider.  Rest your wrist from any activity that may be causing your pain. If your condition is work related, talk to your employer about changes that can be made, such as getting a wrist pad to use while typing.  If directed, apply ice to the painful area:  Put ice in a plastic bag.  Place a towel between your skin and the bag.  Leave the ice on for 20 minutes, 2-3 times per day.  Keep all follow-up visits as told by your health care provider. This is important.  Do any exercises as told by your health care provider, physical therapist, or occupational therapist. Contact a health care provider if:  You have new symptoms.  Your pain is not controlled with medicines.  Your symptoms get worse. This information is not intended to replace advice given to you by your health care provider. Make sure you discuss any questions you have with your health care provider. Document Released: 05/03/2000 Document Revised: 09/14/2015 Document Reviewed: 09/21/2014 Elsevier Interactive Patient Education  2017 [employer] Third Trimester of Pregnancy The third trimester is from week 29  through week 40 (months 7 through 9). The third trimester is a time when the unborn baby (fetus) is growing rapidly. At the end of the ninth month, the fetus is about 20 inches in length and weighs 6-10 pounds. Body changes during your third trimester Your body goes through many changes during pregnancy. The changes vary from woman to woman. During the third trimester:  Your weight will continue to increase. You can expect to gain 25-35 pounds (11-16 kg) by the end of the pregnancy.  You may begin to get stretch marks on your hips, abdomen, and breasts.  You may urinate more often because the fetus is moving lower into your pelvis and pressing on your bladder.  You may develop or continue to have heartburn. This is caused by increased hormones that slow down muscles in the digestive tract.  You may develop or continue to have constipation because increased hormones slow digestion and cause the muscles that push waste through your intestines to relax.  You may develop hemorrhoids. These are swollen veins (varicose veins) in the rectum that can itch or be painful.  You  may develop swollen, bulging veins (varicose veins) in your legs.  You may have increased body aches in the pelvis, back, or thighs. This is due to weight gain and increased hormones that are relaxing your joints.  You may have changes in your hair. These can include thickening of your hair, rapid growth, and changes in texture. Some women also have hair loss during or after pregnancy, or hair that feels dry or thin. Your hair will most likely return to normal after your baby is born.  Your breasts will continue to grow and they will continue to become tender. A yellow fluid (colostrum) may leak from your breasts. This is the first milk you are producing for your baby.  Your belly button may stick out.  You may notice more swelling in your hands, face, or ankles.  You may have increased tingling or numbness in your hands,  arms, and legs. The skin on your belly may also feel numb.  You may feel short of breath because of your expanding uterus.  You may have more problems sleeping. This can be caused by the size of your belly, increased need to urinate, and an increase in your body's metabolism.  You may notice the fetus "dropping," or moving lower in your abdomen.  You may have increased vaginal discharge.  Your cervix becomes thin and soft (effaced) near your due date. What to expect at prenatal visits You will have prenatal exams every 2 weeks until week 36. Then you will have weekly prenatal exams. During a routine prenatal visit:  You will be weighed to make sure you and the fetus are growing normally.  Your blood pressure will be taken.  Your abdomen will be measured to track your baby's growth.  The fetal heartbeat will be listened to.  Any test results from the previous visit will be discussed.  You may have a cervical check near your due date to see if you have effaced. At around 36 weeks, your health care provider will check your cervix. At the same time, your health care provider will also perform a test on the secretions of the vaginal tissue. This test is to determine if a type of bacteria, Group B streptococcus, is present. Your health care provider will explain this further. Your health care provider may ask you:  What your birth plan is.  How you are feeling.  If you are feeling the baby move.  If you have had any abnormal symptoms, such as leaking fluid, bleeding, severe headaches, or abdominal cramping.  If you are using any tobacco products, including cigarettes, chewing tobacco, and electronic cigarettes.  If you have any questions. Other tests or screenings that may be performed during your third trimester include:  Blood tests that check for low iron levels (anemia).  Fetal testing to check the health, activity level, and growth of the fetus. Testing is done if you have  certain medical conditions or if there are problems during the pregnancy.  Nonstress test (NST). This test checks the health of your baby to make sure there are no signs of problems, such as the baby not getting enough oxygen. During this test, a belt is placed around your belly. The baby is made to move, and its heart rate is monitored during movement. What is false labor? False labor is a condition in which you feel small, irregular tightenings of the muscles in the womb (contractions) that eventually go away. These are called Braxton Hicks contractions. Contractions may last for  hours, days, or even weeks before true labor sets in. If contractions come at regular intervals, become more frequent, increase in intensity, or become painful, you should see your health care provider. What are the signs of labor?  Abdominal cramps.  Regular contractions that start at 10 minutes apart and become stronger and more frequent with time.  Contractions that start on the top of the uterus and spread down to the lower abdomen and back.  Increased pelvic pressure and dull back pain.  A watery or bloody mucus discharge that comes from the vagina.  Leaking of amniotic fluid. This is also known as your "water breaking." It could be a slow trickle or a gush. Let your doctor know if it has a color or strange odor. If you have any of these signs, call your health care provider right away, even if it is before your due date. Follow these instructions at home: Eating and drinking  Continue to eat regular, healthy meals.  Do not eat:  Raw meat or meat spreads.  Unpasteurized milk or cheese.  Unpasteurized juice.  Store-made salad.  Refrigerated smoked seafood.  Hot dogs or deli meat, unless they are piping hot.  More than 6 ounces of albacore tuna a week.  Shark, swordfish, king mackerel, or tile fish.  Store-made salads.  Raw sprouts, such as mung bean or alfalfa sprouts.  Take prenatal  vitamins as told by your health care provider.  Take 1000 mg of calcium daily as told by your health care provider.  If you develop constipation:  Take over-the-counter or prescription medicines.  Drink enough fluid to keep your urine clear or pale yellow.  Eat foods that are high in fiber, such as fresh fruits and vegetables, whole grains, and beans.  Limit foods that are high in fat and processed sugars, such as fried and sweet foods. Activity  Exercise only as directed by your health care provider. Healthy pregnant women should aim for 2 hours and 30 minutes of moderate exercise per week. If you experience any pain or discomfort while exercising, stop.  Avoid heavy lifting.  Do not exercise in extreme heat or humidity, or at high altitudes.  Wear low-heel, comfortable shoes.  Practice good posture.  Do not travel far distances unless it is absolutely necessary and only with the approval of your health care provider.  Wear your seat belt at all times while in a car, on a bus, or on a plane.  Take frequent breaks and rest with your legs elevated if you have leg cramps or low back pain.  Do not use hot tubs, steam rooms, or saunas.  You may continue to have sex unless your health care provider tells you otherwise. Lifestyle  Do not use any products that contain nicotine or tobacco, such as cigarettes and e-cigarettes. If you need help quitting, ask your health care provider.  Do not drink alcohol.  Do not use any medicinal herbs or unprescribed drugs. These chemicals affect the formation and growth of the baby.  If you develop varicose veins:  Wear support pantyhose or compression stockings as told by your healthcare provider.  Elevate your feet for 15 minutes, 3-4 times a day.  Wear a supportive maternity bra to help with breast tenderness. General instructions  Take over-the-counter and prescription medicines only as told by your health care provider. There are  medicines that are either safe or unsafe to take during pregnancy.  Take warm sitz baths to soothe any pain or discomfort  caused by hemorrhoids. Use hemorrhoid cream or witch hazel if your health care provider approves.  Avoid cat litter boxes and soil used by cats. These carry germs that can cause birth defects in the baby. If you have a cat, ask someone to clean the litter box for you.  To prepare for the arrival of your baby:  Take prenatal classes to understand, practice, and ask questions about the labor and delivery.  Make a trial run to the hospital.  Visit the hospital and tour the maternity area.  Arrange for maternity or paternity leave through employers.  Arrange for family and friends to take care of pets while you are in the hospital.  Purchase a rear-facing car seat and make sure you know how to install it in your car.  Pack your hospital bag.  Prepare the baby's nursery. Make sure to remove all pillows and stuffed animals from the baby's crib to prevent suffocation.  Visit your dentist if you have not gone during your pregnancy. Use a soft toothbrush to brush your teeth and be gentle when you floss.  Keep all prenatal follow-up visits as told by your health care provider. This is important. Contact a health care provider if:  You are unsure if you are in labor or if your water has broken.  You become dizzy.  You have mild pelvic cramps, pelvic pressure, or nagging pain in your abdominal area.  You have lower back pain.  You have persistent nausea, vomiting, or diarrhea.  You have an unusual or bad smelling vaginal discharge.  You have pain when you urinate. Get help right away if:  You have a fever.  You are leaking fluid from your vagina.  You have spotting or bleeding from your vagina.  You have severe abdominal pain or cramping.  You have rapid weight loss or weight gain.  You have shortness of breath with chest pain.  You notice sudden or  extreme swelling of your face, hands, ankles, feet, or legs.  Your baby makes fewer than 10 movements in 2 hours.  You have severe headaches that do not go away with medicine.  You have vision changes. Summary  The third trimester is from week 29 through week 40, months 7 through 9. The third trimester is a time when the unborn baby (fetus) is growing rapidly.  During the third trimester, your discomfort may increase as you and your baby continue to gain weight. You may have abdominal, leg, and back pain, sleeping problems, and an increased need to urinate.  During the third trimester your breasts will keep growing and they will continue to become tender. A yellow fluid (colostrum) may leak from your breasts. This is the first milk you are producing for your baby.  False labor is a condition in which you feel small, irregular tightenings of the muscles in the womb (contractions) that eventually go away. These are called Braxton Hicks contractions. Contractions may last for hours, days, or even weeks before true labor sets in.  Signs of labor can include: abdominal cramps; regular contractions that start at 10 minutes apart and become stronger and more frequent with time; watery or bloody mucus discharge that comes from the vagina; increased pelvic pressure and dull back pain; and leaking of amniotic fluid. This information is not intended to replace advice given to you by your health care provider. Make sure you discuss any questions you have with your health care provider. Document Released: 04/30/2001 Document Revised: 10/12/2015 Document Reviewed: 07/07/2012  Elsevier Interactive Patient Education  2017 [employer]  

## 2016-04-03 NOTE — Progress Notes (Signed)
Subjective:    Brittany Cordova is a 25 y.o. female being seen today for her obstetrical visit. She is at 657w2d gestation. Patient reports no bleeding, no contractions, no cramping, no leaking and bilateral carpel tunnel pain. Fetal movement: normal.  Problem List Items Addressed This Visit      Cardiovascular and Mediastinum   Chronic hypertension affecting pregnancy   Relevant Orders   US MFM OB FOLLOW UP   Lactate dehydrogenase   Creatinine, serum   CBC   ALT   AST   Pathologist smear review   Protein / creatinine ratio, urine     Other   Supervision of normal pregnancy, antepartum   Rubella non-immune status, antepartum    Other Visit Diagnoses    Supervision of normal first pregnancy in third trimester    -  Primary   Relevant Orders   US MFM OB FOLLOW UP     Patient Active Problem List   Diagnosis Date Noted  . Chronic hypertension affecting pregnancy 04/03/2016  . Rubella non-immune status, antepartum 04/03/2016  . Rh negative, antepartum 02/14/2016  . Supervision of normal pregnancy, antepartum 02/13/2016  . Obstructive sleep apnea 11/01/2010  . Pruritus 09/30/2010  . ALLERGIC RHINITIS 06/12/2008  . ANEMIA 05/31/2008  . ANGIOEDEMA 05/31/2008   Objective:    BP (!) 141/85   Pulse 96   Wt 247 lb (112 kg)   LMP 07/31/2015 (Approximate)   BMI 43.75 kg/m  FHT:  145 BPM  Uterine Size: 34 cm and size equals dates  Presentation: cephalic     Assessment:    Pregnancy @ 6757w2d weeks   CHTN  Carpel tunnel syndrome  Plan:    Letter for increased breaks at work   F/U US @ MFM ordered   Rx written for bilateral wrist splints.    labs reviewed, problem list updated Consent signed. GBS planning TDAP offered  Rhogam given for RH negative Pediatrician: discussed. Infant feeding: plans to breastfeed. Maternity leave: discussed. Cigarette smoking: never smoked. Orders Placed This Encounter  Procedures  . US MFM OB FOLLOW UP    Standing Status:    Future    Standing Expiration Date:   06/03/2017    Order Specific Question:   Reason for Exam (SYMPTOM  OR DIAGNOSIS REQUIRED)    Answer:   f/u fetal anatomy, growth    Order Specific Question:   Preferred imaging location?    Answer:   [facility]  . Lactate dehydrogenase  . Creatinine, serum  . CBC  . ALT  . AST  . Pathologist smear review  . Protein / creatinine ratio, urine   No orders of the defined types were placed in this encounter.  Follow up in 2 Weeks.

## 2016-04-04 LAB — PROTEIN / CREATININE RATIO, URINE
CREATININE, UR: 159.2 mg/dL
PROTEIN UR: 16.9 mg/dL
Protein/Creat Ratio: 106 mg/g creat (ref 0–200)

## 2016-04-04 LAB — CBC
Hematocrit: 30.9 % — ABNORMAL LOW (ref 34.0–46.6)
Hemoglobin: 10.3 g/dL — ABNORMAL LOW (ref 11.1–15.9)
MCH: 25.4 pg — ABNORMAL LOW (ref 26.6–33.0)
MCHC: 33.3 g/dL (ref 31.5–35.7)
MCV: 76 fL — ABNORMAL LOW (ref 79–97)
PLATELETS: 257 10*3/uL (ref 150–379)
RBC: 4.05 x10E6/uL (ref 3.77–5.28)
RDW: 15.1 % (ref 12.3–15.4)
WBC: 9.9 10*3/uL (ref 3.4–10.8)

## 2016-04-04 LAB — AST: AST: 17 IU/L (ref 0–40)

## 2016-04-04 LAB — CREATININE, SERUM
CREATININE: 0.67 mg/dL (ref 0.57–1.00)
GFR calc Af Amer: 140 mL/min/{1.73_m2} (ref >59)
GFR, EST NON AFRICAN AMERICAN: 122 mL/min/{1.73_m2} (ref >59)

## 2016-04-04 LAB — ALT: ALT: 9 IU/L (ref 0–32)

## 2016-04-04 LAB — LACTATE DEHYDROGENASE: LDH: 208 IU/L (ref 119–226)

## 2016-04-08 ENCOUNTER — Ambulatory Visit (HOSPITAL_COMMUNITY): Payer: Medicaid Other

## 2016-04-10 ENCOUNTER — Other Ambulatory Visit: Payer: Self-pay | Admitting: Certified Nurse Midwife

## 2016-04-10 LAB — PATHOLOGIST SMEAR REVIEW
BASOS: 0 %
Basophils Absolute: 0 10*3/uL (ref 0.0–0.2)
EOS (ABSOLUTE): 0.1 10*3/uL (ref 0.0–0.4)
Eos: 1 %
HEMATOCRIT: 31.5 % — AB (ref 34.0–46.6)
Hemoglobin: 10.3 g/dL — ABNORMAL LOW (ref 11.1–15.9)
IMMATURE GRANS (ABS): 0.1 10*3/uL (ref 0.0–0.1)
IMMATURE GRANULOCYTES: 1 %
LYMPHS: 31 %
Lymphocytes Absolute: 3.1 10*3/uL (ref 0.7–3.1)
MCH: 25.6 pg — ABNORMAL LOW (ref 26.6–33.0)
MCHC: 32.7 g/dL (ref 31.5–35.7)
MCV: 78 fL — ABNORMAL LOW (ref 79–97)
MONOCYTES: 6 %
MONOS ABS: 0.6 10*3/uL (ref 0.1–0.9)
NEUTROS PCT: 61 %
Neutrophils Absolute: 6 10*3/uL (ref 1.4–7.0)
PATH REV PLTS: NORMAL
Platelets: 262 10*3/uL (ref 150–379)
RBC: 4.03 x10E6/uL (ref 3.77–5.28)
RDW: 14.6 % (ref 12.3–15.4)
WBC: 9.9 10*3/uL (ref 3.4–10.8)

## 2016-04-16 ENCOUNTER — Ambulatory Visit (HOSPITAL_COMMUNITY)
Admission: RE | Admit: 2016-04-16 | Discharge: 2016-04-16 | Disposition: A | Discharge status: Home / Self Care | Payer: Medicaid Other | Source: Ambulatory Visit | Attending: Certified Nurse Midwife | Admitting: Certified Nurse Midwife

## 2016-04-16 ENCOUNTER — Encounter (HOSPITAL_COMMUNITY): Payer: Self-pay

## 2016-04-16 ENCOUNTER — Other Ambulatory Visit: Payer: Self-pay | Admitting: Certified Nurse Midwife

## 2016-04-16 DIAGNOSIS — O10919 Unspecified pre-existing hypertension complicating pregnancy, unspecified trimester: Secondary | ICD-10-CM

## 2016-04-16 DIAGNOSIS — O99213 Obesity complicating pregnancy, third trimester: Secondary | ICD-10-CM | POA: Diagnosis not present

## 2016-04-16 DIAGNOSIS — Z3A36 36 weeks gestation of pregnancy: Secondary | ICD-10-CM | POA: Diagnosis not present

## 2016-04-16 DIAGNOSIS — O10913 Unspecified pre-existing hypertension complicating pregnancy, third trimester: Secondary | ICD-10-CM | POA: Diagnosis present

## 2016-04-16 DIAGNOSIS — O10013 Pre-existing essential hypertension complicating pregnancy, third trimester: Secondary | ICD-10-CM | POA: Insufficient documentation

## 2016-04-16 DIAGNOSIS — Z3403 Encounter for supervision of normal first pregnancy, third trimester: Secondary | ICD-10-CM

## 2016-04-16 DIAGNOSIS — O360130 Maternal care for anti-D [Rh] antibodies, third trimester, not applicable or unspecified: Secondary | ICD-10-CM | POA: Insufficient documentation

## 2016-04-17 ENCOUNTER — Inpatient Hospital Stay (HOSPITAL_COMMUNITY)
Admission: AD | Admit: 2016-04-17 | Discharge: 2016-04-17 | Disposition: A | Discharge status: Home / Self Care | Payer: Medicaid Other | Source: Ambulatory Visit | Attending: Obstetrics & Gynecology | Admitting: Obstetrics & Gynecology

## 2016-04-17 ENCOUNTER — Encounter (HOSPITAL_COMMUNITY): Payer: Self-pay | Admitting: *Deleted

## 2016-04-17 DIAGNOSIS — J45909 Unspecified asthma, uncomplicated: Secondary | ICD-10-CM | POA: Diagnosis not present

## 2016-04-17 DIAGNOSIS — R110 Nausea: Secondary | ICD-10-CM | POA: Diagnosis not present

## 2016-04-17 DIAGNOSIS — Z8249 Family history of ischemic heart disease and other diseases of the circulatory system: Secondary | ICD-10-CM | POA: Insufficient documentation

## 2016-04-17 DIAGNOSIS — O133 Gestational [pregnancy-induced] hypertension without significant proteinuria, third trimester: Secondary | ICD-10-CM | POA: Diagnosis not present

## 2016-04-17 DIAGNOSIS — Z809 Family history of malignant neoplasm, unspecified: Secondary | ICD-10-CM | POA: Insufficient documentation

## 2016-04-17 DIAGNOSIS — Z79899 Other long term (current) drug therapy: Secondary | ICD-10-CM | POA: Diagnosis not present

## 2016-04-17 DIAGNOSIS — Z3689 Encounter for other specified antenatal screening: Secondary | ICD-10-CM | POA: Diagnosis not present

## 2016-04-17 DIAGNOSIS — O99513 Diseases of the respiratory system complicating pregnancy, third trimester: Secondary | ICD-10-CM | POA: Insufficient documentation

## 2016-04-17 DIAGNOSIS — G4489 Other headache syndrome: Secondary | ICD-10-CM

## 2016-04-17 DIAGNOSIS — Z9889 Other specified postprocedural states: Secondary | ICD-10-CM | POA: Diagnosis not present

## 2016-04-17 DIAGNOSIS — Z87891 Personal history of nicotine dependence: Secondary | ICD-10-CM | POA: Diagnosis not present

## 2016-04-17 DIAGNOSIS — J029 Acute pharyngitis, unspecified: Secondary | ICD-10-CM | POA: Diagnosis present

## 2016-04-17 DIAGNOSIS — R51 Headache: Secondary | ICD-10-CM | POA: Diagnosis present

## 2016-04-17 DIAGNOSIS — Z888 Allergy status to other drugs, medicaments and biological substances status: Secondary | ICD-10-CM | POA: Insufficient documentation

## 2016-04-17 DIAGNOSIS — Z3A36 36 weeks gestation of pregnancy: Secondary | ICD-10-CM | POA: Diagnosis not present

## 2016-04-17 DIAGNOSIS — Z833 Family history of diabetes mellitus: Secondary | ICD-10-CM | POA: Insufficient documentation

## 2016-04-17 DIAGNOSIS — O26893 Other specified pregnancy related conditions, third trimester: Secondary | ICD-10-CM | POA: Diagnosis not present

## 2016-04-17 DIAGNOSIS — O163 Unspecified maternal hypertension, third trimester: Secondary | ICD-10-CM | POA: Diagnosis not present

## 2016-04-17 DIAGNOSIS — O10919 Unspecified pre-existing hypertension complicating pregnancy, unspecified trimester: Secondary | ICD-10-CM

## 2016-04-17 LAB — URINALYSIS, ROUTINE W REFLEX MICROSCOPIC
Bilirubin Urine: NEGATIVE
Glucose, UA: NEGATIVE mg/dL
Hgb urine dipstick: NEGATIVE
Ketones, ur: NEGATIVE mg/dL
NITRITE: NEGATIVE
Protein, ur: NEGATIVE mg/dL
SPECIFIC GRAVITY, URINE: 1.02 (ref 1.005–1.030)
pH: 6.5 (ref 5.0–8.0)

## 2016-04-17 LAB — CBC
HCT: 31.4 % — ABNORMAL LOW (ref 36.0–46.0)
HEMOGLOBIN: 10.4 g/dL — AB (ref 12.0–15.0)
MCH: 25.8 pg — AB (ref 26.0–34.0)
MCHC: 33.1 g/dL (ref 30.0–36.0)
MCV: 77.9 fL — ABNORMAL LOW (ref 78.0–100.0)
PLATELETS: 221 10*3/uL (ref 150–400)
RBC: 4.03 MIL/uL (ref 3.87–5.11)
RDW: 15.2 % (ref 11.5–15.5)
WBC: 7.8 10*3/uL (ref 4.0–10.5)

## 2016-04-17 LAB — COMPREHENSIVE METABOLIC PANEL
ALK PHOS: 130 U/L — AB (ref 38–126)
ALT: 15 U/L (ref 14–54)
ANION GAP: 7 (ref 5–15)
AST: 20 U/L (ref 15–41)
Albumin: 3.1 g/dL — ABNORMAL LOW (ref 3.5–5.0)
BUN: 7 mg/dL (ref 6–20)
CALCIUM: 9.1 mg/dL (ref 8.9–10.3)
CO2: 23 mmol/L (ref 22–32)
Chloride: 104 mmol/L (ref 101–111)
Creatinine, Ser: 0.65 mg/dL (ref 0.44–1.00)
GFR calc non Af Amer: >60 mL/min (ref >60)
Glucose, Bld: 77 mg/dL (ref 65–99)
POTASSIUM: 4.2 mmol/L (ref 3.5–5.1)
SODIUM: 134 mmol/L — AB (ref 135–145)
Total Bilirubin: 0.1 mg/dL — ABNORMAL LOW (ref 0.3–1.2)
Total Protein: 7.1 g/dL (ref 6.5–8.1)

## 2016-04-17 LAB — URINE MICROSCOPIC-ADD ON

## 2016-04-17 LAB — PROTEIN / CREATININE RATIO, URINE
Creatinine, Urine: 140 mg/dL
PROTEIN CREATININE RATIO: 0.09 mg/mg{creat} (ref 0.00–0.15)
TOTAL PROTEIN, URINE: 13 mg/dL

## 2016-04-17 LAB — RAPID STREP SCREEN (MED CTR MEBANE ONLY): Streptococcus, Group A Screen (Direct): NEGATIVE

## 2016-04-17 MED ORDER — BUTALBITAL-APAP-CAFFEINE 50-325-40 MG PO TABS: 1.0000 | ORAL_TABLET | Freq: Four times a day (QID) | ORAL | 0 refills | Status: DC | PRN

## 2016-04-17 MED ORDER — METOCLOPRAMIDE HCL 5 MG PO TABS: 5.0000 mg | ORAL_TABLET | Freq: Three times a day (TID) | ORAL | 0 refills | Status: DC | PRN

## 2016-04-17 MED ORDER — ACETAMINOPHEN 325 MG PO TABS
650.0000 mg | ORAL_TABLET | Freq: Four times a day (QID) | ORAL | Status: DC | PRN
  Administered 2016-04-17: 650 mg via ORAL
  Filled 2016-04-17: qty 2

## 2016-04-17 NOTE — Discharge Instructions (Signed)

## 2016-04-17 NOTE — MAU Provider Note (Signed)
MAU PROVIDER NOTE   Chief Complaint:  Headache, sore throat HPI: Brittany Cordova is a 25 y.o. G1P0000 at 1183w2d who presents to maternity admissions reporting headache and sore throat x past few days.  Also with lumbar back pain that started a few days ago.  Patient is a poor historian and does not offer much information when asked.  Mother in the room and also does not contribute. Patient states she is hungry.  Has been having hot and cold chills at home but does not know if she has had any fevers.  Has a productive cough that is whitish yellow.  Denies diarrhea.  Able to keep fluids and food down.  Has nausea but no vomiting.  Has had some high pressures in previous pregnancy as well.  Denies visual changes and leg edema.    Denies contractions, leakage of fluid or vaginal bleeding. Good fetal movement.   Pregnancy Course:    Past Medical History:  Past Medical History:  Diagnosis Date  . Allergic rhinitis, cause unspecified   . 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 Amenorrhea    on BC hormone inj  . Anemia, unspecified   . 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 Asthma   . Dermatitis due to food taken internally   . Hypertension   . Mass    heart   Past obstetric history: OB History  Gravida Para Term Preterm AB Living  1 0 0 0 0 0  SAB TAB Ectopic Multiple Live Births  0 0 0 0      # Outcome Date GA Lbr Len/2nd Weight Sex Delivery Anes PTL Lv  1 Current              Past Surgical History: Past Surgical History:  Procedure Laterality Date  . WISDOM TOOTH EXTRACTION     Family History: Family History  Problem Relation Age of Onset  . Allergies Father     tomatoes  . Hypertension Father   . Asthma Mother   . Hypertension Mother   . Diabetes Other     grandmother  . Cancer Paternal Grandmother   . Cancer Maternal Uncle     [employer]reat Uncle  . Cancer Other     great aunts  . Diabetes Maternal Grandmother    Social History: Social History  Substance Use Topics  .  Smoking status: Former Smoker    Years: 1.00    Types: Cigars    Quit date: 08/19/2010  . Smokeless tobacco: Never Used  . Alcohol use No   Allergies:  Allergies  Allergen Reactions  . Ibuprofen Anaphylaxis and Swelling   Meds:  Prescriptions Prior to Admission  Medication Sig Dispense Refill Last Dose  . Prenatal Vit-Fe Phos-FA-Omega (VITAFOL GUMMIES) 3.33-0.333-34.8 MG CHEW Chew 3 tablets by mouth daily before breakfast. 90 tablet 3 04/16/2016 at Unknown time  . ondansetron (ZOFRAN) 4 MG tablet Take 1 tablet (4 mg total) by mouth every 6 (six) hours. (Patient not taking: Reported on 04/16/2016) 12 tablet 0 Not Taking   I have reviewed patient's Past Medical Hx, Surgical Hx, Family Hx, Social Hx, medications and allergies.   ROS:  A comprehensive ROS was negative except per HPI.   Physical Exam   Patient Vitals for the past 24 hrs:  BP Temp Pulse Resp SpO2  04/17/16 1132 125/76 - 92 - -  04/17/16 1105 124/60 - 85 - -  04/17/16 1103 154/95 - 78 - -  04/17/16 1000 141/86 98.1 F (  36.7 C) 89 18 97 %   Constitutional: Obese well-developed, well-nourished female in NAD.  Cardiovascular: RRR, no MRG HEENT: no exudates, slight erythema. Respiratory: CTA b/l, normal effort GI: Abd soft, non-tender, gravid appropriate for gestational age. Pos BS x 4 MS: Extremities nontender, no edema, normal ROM Neurologic: Alert and oriented x 4.  GU: Neg CVAT.  Pelvic: NEFG, physiologic discharge, no blood, cervix clean. No CMT    FHT:  Baseline 130s , moderate variability, accelerations present, no decelerations Contractions: N/A   Labs: Results for orders placed or performed during the hospital encounter of 04/17/16 (from the past 24 hour(s))  Urinalysis, Routine w reflex microscopic (not at [facility])     Status: Abnormal   Collection Time: 04/17/16 10:04 AM  Result Value Ref Range   Color, Urine YELLOW YELLOW   APPearance CLEAR CLEAR   Specific Gravity, Urine 1.020 1.005 - 1.030   pH 6.5  5.0 - 8.0   Glucose, UA NEGATIVE NEGATIVE mg/dL   Hgb urine dipstick NEGATIVE NEGATIVE   Bilirubin Urine NEGATIVE NEGATIVE   Ketones, ur NEGATIVE NEGATIVE mg/dL   Protein, ur NEGATIVE NEGATIVE mg/dL   Nitrite NEGATIVE NEGATIVE   Leukocytes, UA TRACE (A) NEGATIVE  Urine microscopic-add on     Status: Abnormal   Collection Time: 04/17/16 10:04 AM  Result Value Ref Range   Squamous Epithelial / LPF 0-5 (A) NONE SEEN   WBC, UA 6-30 0 - 5 WBC/hpf   RBC / HPF 0-5 0 - 5 RBC/hpf   Bacteria, UA FEW (A) NONE SEEN   Urine-Other TRICHOMONAS PRESENT   Protein / creatinine ratio, urine     Status: None   Collection Time: 04/17/16 10:04 AM  Result Value Ref Range   Creatinine, Urine 140.00 mg/dL   Total Protein, Urine 13 mg/dL   Protein Creatinine Ratio 0.09 0.00 - 0.15 mg/mg[Cre]  CBC     Status: Abnormal   Collection Time: 04/17/16 11:27 AM  Result Value Ref Range   WBC 7.8 4.0 - 10.5 K/uL   RBC 4.03 3.87 - 5.11 MIL/uL   Hemoglobin 10.4 (L) 12.0 - 15.0 g/dL   HCT 16.1 (L) 09.6 - 04.5 %   MCV 77.9 (L) 78.0 - 100.0 fL   MCH 25.8 (L) 26.0 - 34.0 pg   MCHC 33.1 30.0 - 36.0 g/dL   RDW 40.9 81.1 - 91.4 %   Platelets 221 150 - 400 K/uL  Comprehensive metabolic panel     Status: Abnormal   Collection Time: 04/17/16 11:27 AM  Result Value Ref Range   Sodium 134 (L) 135 - 145 mmol/L   Potassium 4.2 3.5 - 5.1 mmol/L   Chloride 104 101 - 111 mmol/L   CO2 23 22 - 32 mmol/L   Glucose, Bld 77 65 - 99 mg/dL   BUN 7 6 - 20 mg/dL   Creatinine, Ser 7.82 0.44 - 1.00 mg/dL   Calcium 9.1 8.9 - 95.6 mg/dL   Total Protein 7.1 6.5 - 8.1 g/dL   Albumin 3.1 (L) 3.5 - 5.0 g/dL   AST 20 15 - 41 U/L   ALT 15 14 - 54 U/L   Alkaline Phosphatase 130 (H) 38 - 126 U/L   Total Bilirubin 0.1 (L) 0.3 - 1.2 mg/dL   GFR calc non Af Amer >60 >60 mL/min   GFR calc Af Amer >60 >60 mL/min   Anion gap 7 5 - 15   Imaging:  [place] Mfm Ob Follow Up  Result Date: 04/16/2016 OBSTETRICAL  ULTRASOUND: This exam was performed  within a [facility]. The OB US report was generated in the AS system, and faxed to the ordering physician.  This report is available in the [employer] PACS. See the AS Obstetric US report via the Image Link.  MAU Course: UA  CBC  CMP  Protein:Cr ratio  Tylenol given for headache  Cycle BPs Q15 min  Rapid strep   MDM: Plan of care reviewed with patient, including labs and tests ordered and medical treatment.  Assessment: Patient presents to MAU with cough and headache.  URI likely.  Patient afebrile and otherwise appears well on exam.   Plan: Rapid strep pending, will follow up.  PreE workup ordered (CBC, CMP, Protein:Cr ratio).   Tylenol given for headache.  Blood pressures well controlled at this time.  Protein Cr ratio of 0.09.  Discharge home in stable condition.  Return precautions discussed.   Follow-up Information    [facility].   Specialty:  Gynecology Why:  Keep your next appointment.  Contact information: [address] [place] [id number] [phone]            Medication List    TAKE these medications   ondansetron 4 MG tablet Commonly known as:  ZOFRAN Take 1 tablet (4 mg total) by mouth every 6 (six) hours.   VITAFOL GUMMIES 3.33-0.333-34.8 MG Chew Chew 3 tablets by mouth daily before breakfast.      Freddrick MarchYashika Amin, MD PGY-1 04/17/2016 1:31 PM   Midwife attestation:  I have seen and examined this patient; I agree with above documentation in the resident's note.   Brittany Cordova is a 25 y.o. G1P0000 reporting HA, sore throat, and back pain +FM, denies LOF, VB, contractions, vaginal discharge.  PE: BP 125/76   Pulse 92   Temp 98.1 F (36.7 C)   Resp 18   LMP 07/31/2015 (Approximate)   SpO2 97%  Gen: calm comfortable, NAD Resp: normal effort, no distress Abd: gravid  ROS, labs, PMH reviewed NST reactive  A/P: Gestational HTN- no evidence of  pre-e Headache in pregnancy Reactive NST Discharge home Fioricet Reglan Pre-e precautions Follow up in office as scheduled  Donette LarryMelanie Kainon Varady, CNM  2:38 PM

## 2016-04-17 NOTE — MAU Note (Signed)
Pt presents to MAU with complaints of cough and congestion for a couple of days. Reports pain in her upper back since last night. Denies nay vaginal bleeding or LOF

## 2016-04-20 LAB — CULTURE, GROUP A STREP (THRC)

## 2016-04-22 ENCOUNTER — Ambulatory Visit (INDEPENDENT_AMBULATORY_CARE_PROVIDER_SITE_OTHER): Payer: Medicaid Other | Admitting: Certified Nurse Midwife

## 2016-04-22 ENCOUNTER — Encounter: Payer: Self-pay | Admitting: Certified Nurse Midwife

## 2016-04-22 VITALS — BP 143/91 | HR 92 | Wt 254.0 lb

## 2016-04-22 DIAGNOSIS — Z3400 Encounter for supervision of normal first pregnancy, unspecified trimester: Secondary | ICD-10-CM

## 2016-04-22 DIAGNOSIS — O10913 Unspecified pre-existing hypertension complicating pregnancy, third trimester: Secondary | ICD-10-CM

## 2016-04-22 MED ORDER — LABETALOL HCL 200 MG PO TABS: 200.0000 mg | ORAL_TABLET | Freq: Two times a day (BID) | ORAL | 3 refills | Status: DC

## 2016-04-22 NOTE — Progress Notes (Signed)
Subjective:    Brittany Cordova is a 25 y.o. female being seen today for her obstetrical visit. She is at 4533w0d gestation. Patient reports no bleeding, no contractions, no cramping, no leaking and URI symptoms, blood pressure elevated today. Fetal movement: normal.  Discussed working patient is working 40 hours per week.    Problem List Items Addressed This Visit    None     Patient Active Problem List   Diagnosis Date Noted  . Chronic hypertension affecting pregnancy 04/03/2016  . Rubella non-immune status, antepartum 04/03/2016  . Rh negative, antepartum 02/14/2016  . Supervision of normal pregnancy, antepartum 02/13/2016  . Obstructive sleep apnea 11/01/2010  . Pruritus 09/30/2010  . ALLERGIC RHINITIS 06/12/2008  . ANEMIA 05/31/2008  . ANGIOEDEMA 05/31/2008    Objective:    BP (!) 143/91   Pulse 92   Wt 254 lb (115.2 kg)   LMP 07/31/2015 (Approximate)   BMI 44.99 kg/m  FHT: 150 BPM  Uterine Size: 39 cm and size equals dates  Presentations: cephalic  Pelvic Exam: deferred    NST: + accels, no decels, moderate variability, Cat. 1 tracing. No contractions on toco.    Assessment:    Pregnancy @ 2633w0d weeks   CHTN: labetalol started  URI: acute  Reactive NST  Plan:   Weekly BPPs ordered.    Plans for delivery: Vaginal anticipated; labs reviewed; problem list updated Counseling: Consent signed. Infant feeding: plans to breastfeed. Cigarette smoking: never smoked. L&D discussion: symptoms of labor, discussed when to call, discussed what number to call, anesthetic/analgesic options reviewed and delivering clinician:  plans no preference. Postpartum supports and preparation: circumcision discussed and contraception plans discussed.  Follow up in 1 Week.

## 2016-04-22 NOTE — Addendum Note (Signed)
Addended by: Marya LandryFOSTER, Dynisha Due D on: 04/22/2016 05:01 PM   Modules accepted: Orders

## 2016-04-23 ENCOUNTER — Other Ambulatory Visit: Payer: Self-pay | Admitting: *Deleted

## 2016-04-23 DIAGNOSIS — Z3493 Encounter for supervision of normal pregnancy, unspecified, third trimester: Secondary | ICD-10-CM

## 2016-04-23 LAB — CBC
HEMATOCRIT: 32 % — AB (ref 34.0–46.6)
Hemoglobin: 10.5 g/dL — ABNORMAL LOW (ref 11.1–15.9)
MCH: 25.1 pg — ABNORMAL LOW (ref 26.6–33.0)
MCHC: 32.8 g/dL (ref 31.5–35.7)
MCV: 77 fL — AB (ref 79–97)
PLATELETS: 236 10*3/uL (ref 150–379)
RBC: 4.18 x10E6/uL (ref 3.77–5.28)
RDW: 15.7 % — ABNORMAL HIGH (ref 12.3–15.4)
WBC: 10.1 10*3/uL (ref 3.4–10.8)

## 2016-04-23 LAB — PROTEIN / CREATININE RATIO, URINE
CREATININE, UR: 184.3 mg/dL
PROTEIN UR: 20.5 mg/dL
Protein/Creat Ratio: 111 mg/g creat (ref 0–200)

## 2016-04-23 LAB — CREATININE, SERUM
CREATININE: 0.49 mg/dL — AB (ref 0.57–1.00)
GFR calc non Af Amer: 135 mL/min/{1.73_m2} (ref >59)
GFR, EST AFRICAN AMERICAN: 156 mL/min/{1.73_m2} (ref >59)

## 2016-04-23 LAB — AST: AST: 17 IU/L (ref 0–40)

## 2016-04-23 LAB — LACTATE DEHYDROGENASE: LDH: 208 IU/L (ref 119–226)

## 2016-04-23 LAB — ALT: ALT: 10 IU/L (ref 0–32)

## 2016-04-23 NOTE — Progress Notes (Signed)
Orders for fetal bpp ordered for the next 3 weeks.

## 2016-04-24 ENCOUNTER — Other Ambulatory Visit: Payer: Self-pay | Admitting: Certified Nurse Midwife

## 2016-04-24 DIAGNOSIS — O99820 Streptococcus B carrier state complicating pregnancy: Secondary | ICD-10-CM | POA: Insufficient documentation

## 2016-04-24 LAB — STREP GP B NAA: Strep Gp B NAA: POSITIVE — AB

## 2016-04-26 ENCOUNTER — Other Ambulatory Visit: Payer: Self-pay | Admitting: Certified Nurse Midwife

## 2016-04-26 DIAGNOSIS — N760 Acute vaginitis: Secondary | ICD-10-CM

## 2016-04-26 DIAGNOSIS — B373 Candidiasis of vulva and vagina: Secondary | ICD-10-CM

## 2016-04-26 DIAGNOSIS — A599 Trichomoniasis, unspecified: Secondary | ICD-10-CM

## 2016-04-26 DIAGNOSIS — B9689 Other specified bacterial agents as the cause of diseases classified elsewhere: Secondary | ICD-10-CM

## 2016-04-26 DIAGNOSIS — B3731 Acute candidiasis of vulva and vagina: Secondary | ICD-10-CM

## 2016-04-26 LAB — NUSWAB VG+, CANDIDA 6SP
BVAB 2: HIGH Score — AB
CANDIDA ALBICANS, NAA: POSITIVE — AB
CHLAMYDIA TRACHOMATIS, NAA: NEGATIVE
Candida glabrata, NAA: NEGATIVE
Candida krusei, NAA: NEGATIVE
Candida lusitaniae, NAA: NEGATIVE
Candida parapsilosis, NAA: NEGATIVE
Candida tropicalis, NAA: NEGATIVE
NEISSERIA GONORRHOEAE, NAA: NEGATIVE
Trich vag by NAA: POSITIVE — AB

## 2016-04-26 MED ORDER — FLUCONAZOLE 150 MG PO TABS: 150.0000 mg | ORAL_TABLET | Freq: Once | ORAL | 0 refills | Status: AC

## 2016-04-26 MED ORDER — METRONIDAZOLE 500 MG PO TABS: 500.0000 mg | ORAL_TABLET | Freq: Two times a day (BID) | ORAL | 0 refills | Status: DC

## 2016-04-26 MED ORDER — TERCONAZOLE 0.8 % VA CREA: 1.0000 | TOPICAL_CREAM | Freq: Every day | VAGINAL | 0 refills | Status: DC

## 2016-04-29 ENCOUNTER — Ambulatory Visit (INDEPENDENT_AMBULATORY_CARE_PROVIDER_SITE_OTHER): Payer: Medicaid Other | Admitting: Family

## 2016-04-29 VITALS — BP 134/84 | HR 90 | Wt 256.0 lb

## 2016-04-29 DIAGNOSIS — O10913 Unspecified pre-existing hypertension complicating pregnancy, third trimester: Secondary | ICD-10-CM | POA: Diagnosis not present

## 2016-04-29 DIAGNOSIS — O10919 Unspecified pre-existing hypertension complicating pregnancy, unspecified trimester: Secondary | ICD-10-CM

## 2016-04-29 DIAGNOSIS — O99820 Streptococcus B carrier state complicating pregnancy: Secondary | ICD-10-CM

## 2016-04-29 DIAGNOSIS — Z283 Underimmunization status: Secondary | ICD-10-CM | POA: Diagnosis not present

## 2016-04-29 DIAGNOSIS — A599 Trichomoniasis, unspecified: Secondary | ICD-10-CM

## 2016-04-29 DIAGNOSIS — O9989 Other specified diseases and conditions complicating pregnancy, childbirth and the puerperium: Principal | ICD-10-CM

## 2016-04-29 DIAGNOSIS — O0990 Supervision of high risk pregnancy, unspecified, unspecified trimester: Secondary | ICD-10-CM

## 2016-04-29 DIAGNOSIS — O09899 Supervision of other high risk pregnancies, unspecified trimester: Secondary | ICD-10-CM

## 2016-04-29 LAB — PATHOLOGIST SMEAR REVIEW
BASOS ABS: 0 10*3/uL (ref 0.0–0.2)
BASOS: 0 %
EOS (ABSOLUTE): 0.3 10*3/uL (ref 0.0–0.4)
EOS: 3 %
Hematocrit: 32.1 % — ABNORMAL LOW (ref 34.0–46.6)
Hemoglobin: 10.5 g/dL — ABNORMAL LOW (ref 11.1–15.9)
IMMATURE GRANULOCYTES: 1 %
Immature Grans (Abs): 0.1 10*3/uL (ref 0.0–0.1)
Lymphocytes Absolute: 2.6 10*3/uL (ref 0.7–3.1)
Lymphs: 26 %
MCH: 25.7 pg — ABNORMAL LOW (ref 26.6–33.0)
MCHC: 32.7 g/dL (ref 31.5–35.7)
MCV: 79 fL (ref 79–97)
MONOCYTES: 7 %
Monocytes Absolute: 0.7 10*3/uL (ref 0.1–0.9)
NEUTROS PCT: 63 %
Neutrophils Absolute: 6.4 10*3/uL (ref 1.4–7.0)
PATH REV PLTS: NORMAL
PATH REV WBC: NORMAL
PLATELETS: 239 10*3/uL (ref 150–379)
RBC: 4.08 x10E6/uL (ref 3.77–5.28)
RDW: 15.2 % (ref 12.3–15.4)
WBC: 10.1 10*3/uL (ref 3.4–10.8)

## 2016-04-29 NOTE — Progress Notes (Signed)
   PRENATAL VISIT NOTE  Subjective:  Brittany Cordova is a 25 y.o. G1P0000 at 19w0dbeing seen today for ongoing prenatal care.  She is currently monitored for the following issues for this high-risk pregnancy and has ANEMIA; ALLERGIC RHINITIS; ANGIOEDEMA; Pruritus; Obstructive sleep apnea; Supervision of high risk pregnancy, antepartum; Rh negative, antepartum; Chronic hypertension affecting pregnancy; Rubella non-immune status, antepartum; and GBS (group B Streptococcus carrier), +RV culture, currently pregnant on her problem list.  Patient reports no complaints. Pt upset about being induced early.  Desired waterbirth and low intervention birth.   Contractions: Not present.  .  Movement: Present. Denies leaking of fluid.   The following portions of the patient's history were reviewed and updated as appropriate: allergies, current medications, past family history, past medical history, past social history, past surgical history and problem list. Problem list updated.  Objective:   Vitals:   04/29/16 1541  BP: 134/84  Pulse: 90  Weight: 256 lb (116.1 kg)    Fetal Status:     Movement: Present     General:  Alert, oriented and cooperative. Patient is in no acute distress.  Skin: Skin is warm and dry. No rash noted.   Cardiovascular: Normal heart rate noted  Respiratory: Normal respiratory effort, no problems with respiration noted  Abdomen: Soft, gravid, appropriate for gestational age. Pain/Pressure: Present     Pelvic:  Cervical exam deferred        Extremities: Normal range of motion.     Mental Status: Normal mood and affect. Normal behavior. Normal judgment and thought content.   Assessment and Plan:  Pregnancy: G1P0000 at 372w0d1. Chronic hypertension affecting pregnancy - NST reactive - [place] FETAL BPP WO NON STRESS; Future > scheduled for Thursday - Creatinine clearance, urine, 24 hour - Protein, urine, 24 hour > turned in today - Reviewed growth ultrasound  results from 36 wks (29%ile) - Reviewed preeclampsia symtoms  2. Supervision of high risk pregnancy, antepartum - Close monitoring - Discussed options to have mobility in labor (telemetry, birthing ball)  3. Rubella non-immune status, antepartum - MMR postpartum  4.  Trichomoniasis in Pregnancy - RX Flagyl  5. GBS (group B Streptococcus carrier), +RV culture, currently pregnant - Treatment in labor  Term labor symptoms and general obstetric precautions including but not limited to vaginal bleeding, contractions, leaking of fluid and fetal movement were reviewed in detail with the patient. Please refer to After Visit Summary for other counseling recommendations.   IOL for 39 wks 05/06/16 (confirmed EDD, dated by 6 wk ultrasound)  WaGwen PoundsCNM

## 2016-04-30 ENCOUNTER — Telehealth (HOSPITAL_COMMUNITY): Payer: Self-pay | Admitting: *Deleted

## 2016-04-30 DIAGNOSIS — A599 Trichomoniasis, unspecified: Secondary | ICD-10-CM | POA: Insufficient documentation

## 2016-04-30 LAB — CREATININE CLEARANCE, URINE, 24 HOUR
CREAT CLEAR: 150 mL/min — AB (ref 88–128)
CREATININE, UR: 121.2 mg/dL
Creatinine, 24H Ur: 1212 mg/24 hr (ref 800–1800)
Creatinine, Ser: 0.56 mg/dL — ABNORMAL LOW (ref 0.57–1.00)
GFR calc non Af Amer: 129 mL/min/{1.73_m2} (ref >59)
GFR, EST AFRICAN AMERICAN: 149 mL/min/{1.73_m2} (ref >59)

## 2016-04-30 LAB — PROTEIN, URINE, 24 HOUR
Protein, 24H Urine: 104 mg/24 hr (ref 30–150)
Protein, Ur: 10.4 mg/dL

## 2016-04-30 NOTE — Telephone Encounter (Signed)
Preadmission screen  

## 2016-05-02 ENCOUNTER — Other Ambulatory Visit: Payer: Self-pay | Admitting: Family

## 2016-05-02 ENCOUNTER — Encounter (HOSPITAL_COMMUNITY): Payer: Self-pay

## 2016-05-02 ENCOUNTER — Ambulatory Visit (HOSPITAL_COMMUNITY)
Admission: RE | Admit: 2016-05-02 | Discharge: 2016-05-02 | Disposition: A | Discharge status: Home / Self Care | Payer: Medicaid Other | Source: Ambulatory Visit | Attending: Family | Admitting: Family

## 2016-05-02 DIAGNOSIS — Z3A38 38 weeks gestation of pregnancy: Secondary | ICD-10-CM | POA: Diagnosis not present

## 2016-05-02 DIAGNOSIS — O99213 Obesity complicating pregnancy, third trimester: Secondary | ICD-10-CM | POA: Insufficient documentation

## 2016-05-02 DIAGNOSIS — O0990 Supervision of high risk pregnancy, unspecified, unspecified trimester: Secondary | ICD-10-CM

## 2016-05-02 DIAGNOSIS — O10919 Unspecified pre-existing hypertension complicating pregnancy, unspecified trimester: Secondary | ICD-10-CM

## 2016-05-02 DIAGNOSIS — O10013 Pre-existing essential hypertension complicating pregnancy, third trimester: Secondary | ICD-10-CM | POA: Insufficient documentation

## 2016-05-06 ENCOUNTER — Encounter: Payer: Medicaid Other | Admitting: Obstetrics and Gynecology

## 2016-05-06 ENCOUNTER — Other Ambulatory Visit: Payer: Self-pay | Admitting: Advanced Practice Midwife

## 2016-05-06 ENCOUNTER — Inpatient Hospital Stay (HOSPITAL_COMMUNITY)
Admission: RE | Admit: 2016-05-06 | Discharge: 2016-05-10 | DRG: 765 | Disposition: A | Discharge status: Home / Self Care | Payer: Medicaid Other | Source: Ambulatory Visit | Attending: Obstetrics and Gynecology | Admitting: Obstetrics and Gynecology

## 2016-05-06 ENCOUNTER — Encounter (HOSPITAL_COMMUNITY): Payer: Self-pay

## 2016-05-06 DIAGNOSIS — O09899 Supervision of other high risk pregnancies, unspecified trimester: Secondary | ICD-10-CM

## 2016-05-06 DIAGNOSIS — O9902 Anemia complicating childbirth: Secondary | ICD-10-CM | POA: Diagnosis present

## 2016-05-06 DIAGNOSIS — A599 Trichomoniasis, unspecified: Secondary | ICD-10-CM | POA: Diagnosis present

## 2016-05-06 DIAGNOSIS — O99824 Streptococcus B carrier state complicating childbirth: Secondary | ICD-10-CM | POA: Diagnosis not present

## 2016-05-06 DIAGNOSIS — O99214 Obesity complicating childbirth: Secondary | ICD-10-CM | POA: Diagnosis present

## 2016-05-06 DIAGNOSIS — O26893 Other specified pregnancy related conditions, third trimester: Secondary | ICD-10-CM | POA: Diagnosis present

## 2016-05-06 DIAGNOSIS — Z87891 Personal history of nicotine dependence: Secondary | ICD-10-CM | POA: Diagnosis not present

## 2016-05-06 DIAGNOSIS — G4733 Obstructive sleep apnea (adult) (pediatric): Secondary | ICD-10-CM | POA: Diagnosis not present

## 2016-05-06 DIAGNOSIS — Z6841 Body Mass Index (BMI) 40.0 and over, adult: Secondary | ICD-10-CM | POA: Diagnosis not present

## 2016-05-06 DIAGNOSIS — Z6791 Unspecified blood type, Rh negative: Secondary | ICD-10-CM | POA: Diagnosis not present

## 2016-05-06 DIAGNOSIS — D649 Anemia, unspecified: Secondary | ICD-10-CM | POA: Diagnosis not present

## 2016-05-06 DIAGNOSIS — O10013 Pre-existing essential hypertension complicating pregnancy, third trimester: Secondary | ICD-10-CM

## 2016-05-06 DIAGNOSIS — O0990 Supervision of high risk pregnancy, unspecified, unspecified trimester: Secondary | ICD-10-CM

## 2016-05-06 DIAGNOSIS — Z3A39 39 weeks gestation of pregnancy: Secondary | ICD-10-CM

## 2016-05-06 DIAGNOSIS — O9989 Other specified diseases and conditions complicating pregnancy, childbirth and the puerperium: Secondary | ICD-10-CM

## 2016-05-06 DIAGNOSIS — Z98891 History of uterine scar from previous surgery: Secondary | ICD-10-CM

## 2016-05-06 DIAGNOSIS — O1002 Pre-existing essential hypertension complicating childbirth: Secondary | ICD-10-CM | POA: Diagnosis not present

## 2016-05-06 DIAGNOSIS — O99820 Streptococcus B carrier state complicating pregnancy: Secondary | ICD-10-CM

## 2016-05-06 DIAGNOSIS — Z283 Underimmunization status: Secondary | ICD-10-CM

## 2016-05-06 DIAGNOSIS — O26899 Other specified pregnancy related conditions, unspecified trimester: Secondary | ICD-10-CM

## 2016-05-06 DIAGNOSIS — O10913 Unspecified pre-existing hypertension complicating pregnancy, third trimester: Secondary | ICD-10-CM | POA: Diagnosis present

## 2016-05-06 LAB — COMPREHENSIVE METABOLIC PANEL
ALK PHOS: 134 U/L — AB (ref 38–126)
ALT: 12 U/L — AB (ref 14–54)
AST: 22 U/L (ref 15–41)
Albumin: 3.2 g/dL — ABNORMAL LOW (ref 3.5–5.0)
Anion gap: 8 (ref 5–15)
BUN: 11 mg/dL (ref 6–20)
CALCIUM: 9 mg/dL (ref 8.9–10.3)
CHLORIDE: 104 mmol/L (ref 101–111)
CO2: 22 mmol/L (ref 22–32)
CREATININE: 0.63 mg/dL (ref 0.44–1.00)
GFR calc Af Amer: >60 mL/min (ref >60)
GFR calc non Af Amer: >60 mL/min (ref >60)
GLUCOSE: 78 mg/dL (ref 65–99)
Potassium: 3.8 mmol/L (ref 3.5–5.1)
SODIUM: 134 mmol/L — AB (ref 135–145)
Total Bilirubin: 0.2 mg/dL — ABNORMAL LOW (ref 0.3–1.2)
Total Protein: 7.4 g/dL (ref 6.5–8.1)

## 2016-05-06 LAB — CBC
HCT: 32.8 % — ABNORMAL LOW (ref 36.0–46.0)
HEMOGLOBIN: 10.9 g/dL — AB (ref 12.0–15.0)
MCH: 26 pg (ref 26.0–34.0)
MCHC: 33.2 g/dL (ref 30.0–36.0)
MCV: 78.1 fL (ref 78.0–100.0)
Platelets: 213 10*3/uL (ref 150–400)
RBC: 4.2 MIL/uL (ref 3.87–5.11)
RDW: 15.7 % — ABNORMAL HIGH (ref 11.5–15.5)
WBC: 7.9 10*3/uL (ref 4.0–10.5)

## 2016-05-06 LAB — ABO/RH: ABO/RH(D): O NEG

## 2016-05-06 LAB — TYPE AND SCREEN
ABO/RH(D): O NEG
Antibody Screen: NEGATIVE

## 2016-05-06 LAB — RPR: RPR Ser Ql: NONREACTIVE

## 2016-05-06 MED ORDER — OXYTOCIN 40 UNITS IN LACTATED RINGERS INFUSION - SIMPLE MED: 2.5000 [IU]/h | INTRAVENOUS | Status: DC

## 2016-05-06 MED ORDER — TERBUTALINE SULFATE 1 MG/ML IJ SOLN: 0.2500 mg | Freq: Once | INTRAMUSCULAR | Status: DC | PRN

## 2016-05-06 MED ORDER — OXYCODONE-ACETAMINOPHEN 5-325 MG PO TABS: 2.0000 | ORAL_TABLET | ORAL | Status: DC | PRN

## 2016-05-06 MED ORDER — ONDANSETRON HCL 4 MG/2ML IJ SOLN: 4.0000 mg | Freq: Four times a day (QID) | INTRAMUSCULAR | Status: DC | PRN

## 2016-05-06 MED ORDER — SOD CITRATE-CITRIC ACID 500-334 MG/5ML PO SOLN
30.0000 mL | ORAL | Status: DC | PRN
  Administered 2016-05-08: 30 mL via ORAL
  Filled 2016-05-06: qty 15

## 2016-05-06 MED ORDER — OXYTOCIN BOLUS FROM INFUSION: 500.0000 mL | Freq: Once | INTRAVENOUS | Status: DC

## 2016-05-06 MED ORDER — DEXTROSE 5 % IV SOLN
5.0000 10*6.[IU] | Freq: Once | INTRAVENOUS | Status: DC
  Filled 2016-05-06: qty 5

## 2016-05-06 MED ORDER — FENTANYL CITRATE (PF) 100 MCG/2ML IJ SOLN
100.0000 ug | INTRAMUSCULAR | Status: DC | PRN
  Administered 2016-05-07 – 2016-05-08 (×4): 100 ug via INTRAVENOUS
  Filled 2016-05-06 (×4): qty 2

## 2016-05-06 MED ORDER — LIDOCAINE HCL (PF) 1 % IJ SOLN: 30.0000 mL | INTRAMUSCULAR | Status: DC | PRN

## 2016-05-06 MED ORDER — TERBUTALINE SULFATE 1 MG/ML IJ SOLN
0.2500 mg | Freq: Once | INTRAMUSCULAR | Status: AC | PRN
  Administered 2016-05-08: 0.25 mg via SUBCUTANEOUS
  Filled 2016-05-06: qty 1

## 2016-05-06 MED ORDER — OXYCODONE-ACETAMINOPHEN 5-325 MG PO TABS
1.0000 | ORAL_TABLET | ORAL | Status: DC | PRN
Start: 2016-05-06 — End: 2016-05-08

## 2016-05-06 MED ORDER — ACETAMINOPHEN 325 MG PO TABS: 650.0000 mg | ORAL_TABLET | ORAL | Status: DC | PRN

## 2016-05-06 MED ORDER — PENICILLIN G POT IN DEXTROSE 60000 UNIT/ML IV SOLN
3.0000 10*6.[IU] | INTRAVENOUS | Status: DC
  Filled 2016-05-06 (×7): qty 50

## 2016-05-06 MED ORDER — MISOPROSTOL 25 MCG QUARTER TABLET
25.0000 ug | ORAL_TABLET | ORAL | Status: DC | PRN
  Administered 2016-05-06 (×4): 25 ug via VAGINAL
  Filled 2016-05-06 (×4): qty 0.25

## 2016-05-06 MED ORDER — ZOLPIDEM TARTRATE 5 MG PO TABS
5.0000 mg | ORAL_TABLET | Freq: Every evening | ORAL | Status: DC | PRN
  Administered 2016-05-06: 5 mg via ORAL
  Filled 2016-05-06: qty 1

## 2016-05-06 MED ORDER — MISOPROSTOL 25 MCG QUARTER TABLET: 25.0000 ug | ORAL_TABLET | ORAL | Status: DC | PRN

## 2016-05-06 MED ORDER — LACTATED RINGERS IV SOLN
INTRAVENOUS | Status: DC
  Administered 2016-05-06 – 2016-05-08 (×5): via INTRAVENOUS

## 2016-05-06 MED ORDER — LACTATED RINGERS IV SOLN: 500.0000 mL | INTRAVENOUS | Status: DC | PRN

## 2016-05-06 NOTE — Progress Notes (Signed)
Educated patient about diet intake .  Patient is currently on a regular diet.  Last solid intake time is 1800.

## 2016-05-06 NOTE — Anesthesia Pain Management Evaluation Note (Signed)
  CRNA Pain Management Visit Note  Patient: Brittany Cordova, 25 y.o., female  "Hello I am a [facility] the [facility]. We have an anesthesia team available at all times to provide care throughout the hospital, including epidural management and anesthesia for C-section. I don't know your plan for the delivery whether it a natural birth, water birth, IV sedation, nitrous supplementation, doula or epidural, but we want to meet your pain goals."   1.Was your pain managed to your expectations on prior hospitalizations?   No prior hospitalizations  2.What is your expectation for pain management during this hospitalization?     Nitrous Oxide  3.How can we help you reach that goal? N2O  Record the patient's initial score and the patient's pain goal.   Pain: 0  Pain Goal: 8 [facility] wants you to be able to say your pain was always managed very well.  Brittany Cordova 05/06/2016

## 2016-05-06 NOTE — H&P (Signed)
LABOR AND DELIVERY ADMISSION HISTORY AND PHYSICAL NOTE  Brittany Cordova is a 25 y.o. female G1P0000 with IUP at 6842w0d by 6 wk presenting for IOL for chronic hypertension. She has had labile pressures and reports she was prescribed "something" but has never taken it.  She reports positive fetal movement. She denies leakage of fluid or vaginal bleeding.  Prenatal History/Complications:  Past Medical History: Past Medical History:  Diagnosis Date  . Allergic rhinitis, cause unspecified   . 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 Amenorrhea    on BC hormone inj  . Anemia, unspecified   . 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 Asthma   . Dermatitis due to food taken internally   . Hypertension   . Mass    heart    Past Surgical History: Past Surgical History:  Procedure Laterality Date  . WISDOM TOOTH EXTRACTION      Obstetrical History: OB History    Gravida Para Term Preterm AB Living   1 0 0 0 0 0   SAB TAB Ectopic Multiple Live Births   0 0 0 0        Social History: Social History   Social History  . Marital status: Single    Spouse name: N/A  . Number of children: N/A  . Years of education: N/A   Occupational History  . Unemployed Unemployed   Social History Main Topics  . Smoking status: Former Smoker    Years: 1.00    Types: Cigars    Quit date: 08/19/2010  . Smokeless tobacco: Never Used  . Alcohol use No  . Drug use:     Types: Marijuana  . Sexual activity: Not Currently    Birth control/ protection: None   Other Topics Concern  . Not on file   Social History Narrative  . No narrative on file    Family History: Family History  Problem Relation Age of Onset  . Allergies Father     tomatoes  . Hypertension Father   . Asthma Mother   . Hypertension Mother   . Diabetes Other     grandmother  . Cancer Paternal Grandmother   . Cancer Maternal Uncle     [employer]reat Uncle  . Cancer Other     great aunts  . Diabetes Maternal Grandmother      Allergies: Allergies  Allergen Reactions  . Ibuprofen Anaphylaxis and Swelling    Prescriptions Prior to Admission  Medication Sig Dispense Refill Last Dose  . labetalol (NORMODYNE) 200 MG tablet Take 1 tablet (200 mg total) by mouth 2 (two) times daily. 60 tablet 3 Taking  . metroNIDAZOLE (FLAGYL) 500 MG tablet Take 1 tablet (500 mg total) by mouth 2 (two) times daily. (Patient not taking: Reported on 05/02/2016) 14 tablet 0 Not Taking  . Prenatal Vit-Fe Phos-FA-Omega (VITAFOL GUMMIES) 3.33-0.333-34.8 MG CHEW Chew 3 tablets by mouth daily before breakfast. 90 tablet 3 Taking  . terconazole (TERAZOL 3) 0.8 % vaginal cream Place 1 applicator vaginally at bedtime. (Patient not taking: Reported on 05/02/2016) 20 g 0 Not Taking     Review of Systems   All systems reviewed and negative except as stated in HPI  Last menstrual period 07/31/2015. General appearance: alert, cooperative and appears stated age Lungs: clear to auscultation bilaterally Heart: regular rate and rhythm Abdomen: soft, non-tender; bowel sounds normal Extremities: No calf swelling or tenderness Presentation: cephalic by exam Fetal monitoring: category 1 Uterine activity: none Dilation: Closed Effacement (%): Thick  Station: [facility] Exam by:: Ernestina PennaSchenk, Kasra Melvin, MD   Prenatal labs: ABO, Rh: O/Negative/-- (08/09 1805) Antibody: Negative (08/09 1805) Rubella: !Error! RPR: Non Reactive (09/27 1045)  HBsAg: Negative (08/09 1805)  HIV: Non Reactive (09/27 1045)  GBS: Positive (12/04 1701)  2 hr Glucola: hormal Anatomy US: normal  Prenatal Transfer Tool  Maternal Diabetes: No Genetic Screening: Declined Maternal Ultrasounds/Referrals: Normal Fetal Ultrasounds or other Referrals:  None Maternal Substance Abuse:  No Significant Maternal Medications:  None Significant Maternal Lab Results: Lab values include: Group B Strep positive  No results found for this or any previous visit (from the past 24  hour(s)).  Patient Active Problem List   Diagnosis Date Noted  . Chronic hypertension in obstetric context, third trimester 05/06/2016  . Trichomoniasis 04/30/2016  . GBS (group B Streptococcus carrier), +RV culture, currently pregnant 04/24/2016  . Chronic hypertension affecting pregnancy 04/03/2016  . Rubella non-immune status, antepartum 04/03/2016  . Rh negative, antepartum 02/14/2016  . Supervision of high risk pregnancy, antepartum 02/13/2016  . Obstructive sleep apnea 11/01/2010  . Pruritus 09/30/2010  . ALLERGIC RHINITIS 06/12/2008  . ANEMIA 05/31/2008  . ANGIOEDEMA 05/31/2008    Assessment: Brittany Cordova is a 25 y.o. G1P0000 at 6987w0d here for IOL for cHTN  #Labor:Induction at 39 wks, plan for cytotec, foley and pitocin #Pain: Plans for IV pain medication then epdirual #FWB: Category 1 #ID:  GBS postiive PCN #MOF: breast #MOC: unsure #Circ:  N/A #chronic hyptertension: check PIH labs on admission, monitor BP.  Ernestina Pennaicholas Kiana Hollar 05/06/2016, 8:01 AM

## 2016-05-06 NOTE — Progress Notes (Signed)
Patient ID: Brittany Cordova, female   DOB: December 25, 1990, 25 y.o.   MRN: [id number]  Mostly comfortable; s/p cytotec x 3 VSS, afeb BP 143/75 FHR 150s, +accels, no decels Ctx irreg 2-5 mins Cx 1/thick/-3  IUP@term  cHTN  FB attempted without success; will place 4th cytotec soon  Cam HaiSHAW, KIMBERLY CNM 05/06/2016 9:37 PM

## 2016-05-06 NOTE — Progress Notes (Signed)
patient seen doing well. No complaints. Cervix is ft/t/-2. contineu cytotec will re attempt foley placement with 9PM cytotec.

## 2016-05-07 MED ORDER — EPHEDRINE 5 MG/ML INJ: 10.0000 mg | INTRAVENOUS | Status: DC | PRN

## 2016-05-07 MED ORDER — PENICILLIN G POTASSIUM 5000000 UNITS IJ SOLR
5.0000 10*6.[IU] | Freq: Once | INTRAVENOUS | Status: DC | PRN
  Filled 2016-05-07: qty 5

## 2016-05-07 MED ORDER — PENICILLIN G POT IN DEXTROSE 60000 UNIT/ML IV SOLN
3.0000 10*6.[IU] | INTRAVENOUS | Status: DC | PRN
  Filled 2016-05-07: qty 50

## 2016-05-07 MED ORDER — PHENYLEPHRINE 40 MCG/ML (10ML) SYRINGE FOR IV PUSH (FOR BLOOD PRESSURE SUPPORT)
80.0000 ug | PREFILLED_SYRINGE | INTRAVENOUS | Status: DC | PRN
  Administered 2016-05-08: 80 ug via INTRAVENOUS

## 2016-05-07 MED ORDER — MISOPROSTOL 50MCG HALF TABLET: 50.0000 ug | ORAL_TABLET | ORAL | Status: DC | PRN

## 2016-05-07 MED ORDER — PROMETHAZINE HCL 25 MG/ML IJ SOLN
12.5000 mg | Freq: Once | INTRAMUSCULAR | Status: AC
  Administered 2016-05-07: 12.5 mg via INTRAVENOUS
  Filled 2016-05-07: qty 1

## 2016-05-07 MED ORDER — LACTATED RINGERS IV SOLN: 500.0000 mL | Freq: Once | INTRAVENOUS | Status: DC

## 2016-05-07 MED ORDER — MISOPROSTOL 50MCG HALF TABLET
50.0000 ug | ORAL_TABLET | ORAL | Status: DC | PRN
  Administered 2016-05-07: 50 ug via ORAL
  Filled 2016-05-07: qty 0.5

## 2016-05-07 MED ORDER — BUTORPHANOL TARTRATE 1 MG/ML IJ SOLN
2.0000 mg | Freq: Once | INTRAMUSCULAR | Status: AC
  Administered 2016-05-07: 2 mg via INTRAVENOUS
  Filled 2016-05-07: qty 2

## 2016-05-07 MED ORDER — MISOPROSTOL 25 MCG QUARTER TABLET
25.0000 ug | ORAL_TABLET | ORAL | Status: DC
Start: 2016-05-07 — End: 2016-05-07
  Administered 2016-05-07 (×3): 25 ug via VAGINAL
  Filled 2016-05-07 (×3): qty 0.25

## 2016-05-07 MED ORDER — FENTANYL CITRATE (PF) 100 MCG/2ML IJ SOLN
50.0000 ug | Freq: Once | INTRAMUSCULAR | Status: AC
  Administered 2016-05-07: 50 ug via INTRAVENOUS
  Filled 2016-05-07: qty 2

## 2016-05-07 MED ORDER — PENICILLIN G POT IN DEXTROSE 60000 UNIT/ML IV SOLN
3.0000 10*6.[IU] | INTRAVENOUS | Status: DC
  Administered 2016-05-07 – 2016-05-08 (×6): 3 10*6.[IU] via INTRAVENOUS
  Filled 2016-05-07 (×10): qty 50

## 2016-05-07 MED ORDER — PHENYLEPHRINE 40 MCG/ML (10ML) SYRINGE FOR IV PUSH (FOR BLOOD PRESSURE SUPPORT)
80.0000 ug | PREFILLED_SYRINGE | INTRAVENOUS | Status: DC | PRN
  Filled 2016-05-07: qty 10

## 2016-05-07 MED ORDER — DIPHENHYDRAMINE HCL 50 MG/ML IJ SOLN: 12.5000 mg | INTRAMUSCULAR | Status: DC | PRN

## 2016-05-07 MED ORDER — PENICILLIN G POTASSIUM 5000000 UNITS IJ SOLR
5.0000 10*6.[IU] | Freq: Once | INTRAVENOUS | Status: AC
  Administered 2016-05-07: 5 10*6.[IU] via INTRAVENOUS
  Filled 2016-05-07: qty 5

## 2016-05-07 MED ORDER — FENTANYL 2.5 MCG/ML BUPIVACAINE 1/10 % EPIDURAL INFUSION (WH - ANES)
14.0000 mL/h | INTRAMUSCULAR | Status: DC | PRN
  Administered 2016-05-08: 13 mL/h via EPIDURAL
  Filled 2016-05-07: qty 100

## 2016-05-07 NOTE — Progress Notes (Signed)
L&D Note  05/07/2016 - 9:29 PM  25 y.o. G1 8054w1d. Pregnancy complicated by cHTN (no meds), BMI 45, recent h/o trich with treatment, OSA, Rh neg, GBS pos  [name] is admitted for IOL for cHTN   Subjective:  Feels UCs ever few minutes that are somewhat painful No s/s of pre-eclampsia Objective:   Vitals:   05/07/16 1615 05/07/16 1659 05/07/16 1812 05/07/16 2012  BP:   (!) 142/77 (!) 142/88  Pulse:   95 91  Resp: 20 18 20 18   Temp:   98.3 F (36.8 C) 98.3 F (36.8 C)  TempSrc:   Oral Oral  Weight:      Height:        Current Vital Signs 24h Vital Sign Ranges  T 98.3 F (36.8 C) Temp  Avg: 98 F (36.7 C)  Min: 97.5 F (36.4 C)  Max: 98.4 F (36.9 C)  BP (!) 142/88 BP  Min: 115/65  Max: 148/83  HR 91 Pulse  Avg: 82.8  Min: 74  Max: 95  RR 18 Resp  Avg: 19.3  Min: 16  Max: 22  SaO2   Not Delivered No Data Recorded       24 Hour I/O Current Shift I/O  Time Ins Outs No intake/output data recorded. No intake/output data recorded.   FHR: 150 baseline, +accels, no decels, mod var Toco: difficult to trace but UCs dont appear consistent Gen: NAD with mild distress with UCs SVE: deferred. RN just tugged and retaped FB  Labs:   Recent Labs Lab 05/06/16 0847  WBC 7.9  HGB 10.9*  HCT 32.8*  PLT 213    Recent Labs Lab 05/06/16 0847  NA 134*  K 3.8  CL 104  CO2 22  BUN 11  CREATININE 0.63  CALCIUM 9.0  PROT 7.4  BILITOT 0.2*  ALKPHOS 134*  ALT 12*  AST 22  GLUCOSE 78    Medications Current Facility-Administered Medications  Medication Dose Route Frequency Provider Last Rate Last Dose  . acetaminophen (TYLENOL) tablet 650 mg  650 mg Oral Q4H PRN Arabella MerlesKimberly D Shaw, CNM      . diphenhydrAMINE (BENADRYL) injection 12.5 mg  12.5 mg Intravenous Q15 min PRN Jairo Benarswell Jackson, MD      . ePHEDrine injection 10 mg  10 mg Intravenous PRN Jairo Benarswell Jackson, MD      . ePHEDrine injection 10 mg  10 mg Intravenous PRN Jairo Benarswell Jackson, MD      . fentaNYL  (SUBLIMAZE) injection 100 mcg  100 mcg Intravenous Q1H PRN Arabella MerlesKimberly D Shaw, CNM   100 mcg at 05/07/16 0249  . fentaNYL 2.5 mcg/ml w/bupivacaine 0.1% in NS 100ml epidural infusion (WH-ANES)  14 mL/hr Epidural Continuous PRN Jairo Benarswell Jackson, MD      . lactated ringers infusion 500 mL  500 mL Intravenous Once Jairo Benarswell Jackson, MD      . lactated ringers infusion 500-1,000 mL  500-1,000 mL Intravenous PRN Arabella MerlesKimberly D Shaw, CNM      . lactated ringers infusion   Intravenous Continuous [place] Bingharlie Lionardo Haze, MD 125 mL/hr at 05/07/16 0953    . lidocaine (PF) (XYLOCAINE) 1 % injection 30 mL  30 mL Subcutaneous PRN Arabella MerlesKimberly D Shaw, CNM      . ondansetron (ZOFRAN) injection 4 mg  4 mg Intravenous Q6H PRN Arabella MerlesKimberly D Shaw, CNM      . oxyCODONE-acetaminophen (PERCOCET/ROXICET) 5-325 MG per tablet 1 tablet  1 tablet Oral Q4H PRN Arabella MerlesKimberly D Shaw, CNM      .  oxyCODONE-acetaminophen (PERCOCET/ROXICET) 5-325 MG per tablet 2 tablet  2 tablet Oral Q4H PRN Arabella MerlesKimberly D Shaw, CNM      . oxytocin (PITOCIN) IV BOLUS FROM BAG  500 mL Intravenous Once Arabella MerlesKimberly D Shaw, CNM      . oxytocin (PITOCIN) IV infusion 40 units in LR 1000 mL - Premix  2.5 Units/hr Intravenous Continuous Arabella MerlesKimberly D Shaw, CNM      . penicillin G potassium 3 Million Units in dextrose 50mL IVPB  3 Million Units Intravenous Q4H [name] [name], MD   3 Million Units at 05/07/16 1811  . PHENYLephrine 40 mcg/ml in normal saline Adult IV Push Syringe  80 mcg Intravenous PRN Jairo Benarswell Jackson, MD      . PHENYLephrine 40 mcg/ml in normal saline Adult IV Push Syringe  80 mcg Intravenous PRN Jairo Benarswell Jackson, MD      . sodium citrate-citric acid (ORACIT) solution 30 mL  30 mL Oral Q2H PRN Arabella MerlesKimberly D Shaw, CNM      . terbutaline (BRETHINE) injection 0.25 mg  0.25 mg Subcutaneous Once PRN Arabella MerlesKimberly D Shaw, CNM      . zolpidem (AMBIEN) tablet 5 mg  5 mg Oral QHS PRN Arabella MerlesKimberly D Shaw, CNM   5 mg at 05/06/16 2319    Assessment & Plan:  Pt stable *Pregnancy: category I with  accels -normal EFW (28%) with normal AC/AFI on 11/28 *IOL: s/p multiple doses of PV and PO miso with FB in place since 10am today and was 1/thick at that time. Will d/c miso and consider starting low dose pitocin with FB in place. Will leave FB in place for max of 24hrs. D/w pt and family not usual to have long cx ripening for P0 IOL *cHTN: mild range BPs only and no s/s of severe features. Normal admit pre-x labs.  *GBS: pos. On PCN *Analgesia: IV PRN  [name], [name] MD Attending Center for [facility](Faculty Practice)

## 2016-05-07 NOTE — Progress Notes (Signed)
Patient ID: Brittany Cordova, female   DOB: 19-Feb-1991, 25 y.o.   MRN: [id number] LABOR PROGRESS NOTE  Brittany Cordova is a 25 y.o. G1P0000 at 65107w1d  admitted for IOL due to chronic HTN  Subjective: Patient asking for more pain medicine. Contraction pattern prevents administration of vaginal cytotec, so we will administer orally.   Objective: BP 131/69   Pulse 80   Temp 97.7 F (36.5 C) (Axillary)   Resp 20   Ht 5\' 3"  (1.6 m)   Wt 256 lb (116.1 kg)   LMP 07/31/2015 (Approximate)   BMI 45.35 kg/m  or  Vitals:   05/06/16 2323 05/07/16 0001 05/07/16 0201 05/07/16 0243  BP: 135/73 115/65 130/84 131/69  Pulse: 79 88 80 80  Resp: 18 18  20   Temp:    97.7 F (36.5 C)  TempSrc:    Axillary  Weight:      Height:         Dilation: 1 Effacement (%): Thick Cervical Position: Posterior Station: -3 Presentation: Vertex Exam by:: Mervin Kung Stewart, RN  Labs: Lab Results  Component Value Date   WBC 7.9 05/06/2016   HGB 10.9 (L) 05/06/2016   HCT 32.8 (L) 05/06/2016   MCV 78.1 05/06/2016   PLT 213 05/06/2016    Patient Active Problem List   Diagnosis Date Noted  . Chronic hypertension in obstetric context, third trimester 05/06/2016  . Trichomoniasis 04/30/2016  . GBS (group B Streptococcus carrier), +RV culture, currently pregnant 04/24/2016  . Chronic hypertension affecting pregnancy 04/03/2016  . Rubella non-immune status, antepartum 04/03/2016  . Rh negative, antepartum 02/14/2016  . Supervision of high risk pregnancy, antepartum 02/13/2016  . Obstructive sleep apnea 11/01/2010  . Pruritus 09/30/2010  . ALLERGIC RHINITIS 06/12/2008  . ANEMIA 05/31/2008  . ANGIOEDEMA 05/31/2008    Assessment / Plan: 25 y.o. G1P0000 at 41107w1d here for IOL due [facility]  Labor:  PO cytotec given Fetal Wellbeing:  Cat I Pain Control:  Analgesia as needed Anticipated MOD:  NSVD  Clearance Brittany Avilyn Virtue, MD 05/07/2016, 2:45 AM

## 2016-05-07 NOTE — Progress Notes (Signed)
LABOR PROGRESS NOTE  Brittany Cordova is a 25 y.o. G1P0000 at 1173w1d  admitted for iol for chtn  Subjective: No ha, vision change, upper abdominal pain  Objective: BP (!) 148/83   Pulse 83   Temp 97.5 F (36.4 C) (Oral)   Resp 20   Ht 5\' 3"  (1.6 m)   Wt 256 lb (116.1 kg)   LMP 07/31/2015 (Approximate)   BMI 45.35 kg/m  or  Vitals:   05/07/16 0740 05/07/16 0934 05/07/16 0956 05/07/16 1006  BP: 125/74   (!) 148/83  Pulse: 74   83  Resp: (!) 22 20 20    Temp: 97.5 F (36.4 C)     TempSrc: Oral     Weight:      Height:        140/mod/-a/-d Dilation: 1 Effacement (%): Thick Cervical Position: Posterior Station: -3 Presentation: Vertex Exam by:: Dr. Ashok PallWouk  Labs: Lab Results  Component Value Date   WBC 7.9 05/06/2016   HGB 10.9 (L) 05/06/2016   HCT 32.8 (L) 05/06/2016   MCV 78.1 05/06/2016   PLT 213 05/06/2016    Patient Active Problem List   Diagnosis Date Noted  . Chronic hypertension in obstetric context, third trimester 05/06/2016  . Trichomoniasis 04/30/2016  . GBS (group B Streptococcus carrier), +RV culture, currently pregnant 04/24/2016  . Chronic hypertension affecting pregnancy 04/03/2016  . Rubella non-immune status, antepartum 04/03/2016  . Rh negative, antepartum 02/14/2016  . Supervision of high risk pregnancy, antepartum 02/13/2016  . Obstructive sleep apnea 11/01/2010  . Pruritus 09/30/2010  . ALLERGIC RHINITIS 06/12/2008  . ANEMIA 05/31/2008  . ANGIOEDEMA 05/31/2008    Assessment / Plan: 25 y.o. G1P0000 at 5273w1d here for iol for chtn  Labor: foley placed just now, as was cytotec, continue ripening Chtn: bps mild range, labs wnl, no s/s severe disease, no indication for mg or antihypetensive Fetal Wellbeing:  Cat 1 Pain Control:  Iv narcotics Anticipated MOD:  vaginal  Silvano BilisNoah B Andri Prestia, MD 05/07/2016, 10:19 AM

## 2016-05-07 NOTE — Progress Notes (Signed)
Patient ID: Brittany Cordova Nicole Duling, female   DOB: 1991-03-06, 25 y.o.   MRN: [id number]  Pt and family requesting discussion re plan of care. Pt has had cytotec x 4 vag doses and x 1 po dose. Had failed foley attempt last PM. VSS, BP 134/74 FHR 130s, +accels, no decels, occ variables Ctx irreg, mild Cx deferred at present  IUP@term  cHTN- stable BPs Cx unfavorable  Rev'd lengthy IOL process Offered cyto vs foley- elects another dose of cytotec but will eat and shower first Plan to progress to foley later today  Cam HaiSHAW, Yesica Kemler CNM 05/07/2016 9:48 AM

## 2016-05-08 ENCOUNTER — Encounter (HOSPITAL_COMMUNITY)
Admission: RE | Disposition: A | Discharge status: Home / Self Care | Payer: Self-pay | Source: Ambulatory Visit | Attending: Obstetrics and Gynecology

## 2016-05-08 ENCOUNTER — Encounter (HOSPITAL_COMMUNITY): Payer: Self-pay

## 2016-05-08 ENCOUNTER — Inpatient Hospital Stay (HOSPITAL_COMMUNITY): Payer: Medicaid Other | Admitting: Anesthesiology

## 2016-05-08 DIAGNOSIS — Z3A39 39 weeks gestation of pregnancy: Secondary | ICD-10-CM

## 2016-05-08 DIAGNOSIS — O99824 Streptococcus B carrier state complicating childbirth: Secondary | ICD-10-CM

## 2016-05-08 DIAGNOSIS — O10013 Pre-existing essential hypertension complicating pregnancy, third trimester: Secondary | ICD-10-CM

## 2016-05-08 DIAGNOSIS — Z98891 History of uterine scar from previous surgery: Secondary | ICD-10-CM

## 2016-05-08 LAB — CBC
HCT: 32.7 % — ABNORMAL LOW (ref 36.0–46.0)
HEMOGLOBIN: 10.9 g/dL — AB (ref 12.0–15.0)
MCH: 25.8 pg — AB (ref 26.0–34.0)
MCHC: 33.3 g/dL (ref 30.0–36.0)
MCV: 77.5 fL — ABNORMAL LOW (ref 78.0–100.0)
Platelets: 206 10*3/uL (ref 150–400)
RBC: 4.22 MIL/uL (ref 3.87–5.11)
RDW: 15.5 % (ref 11.5–15.5)
WBC: 8.9 10*3/uL (ref 4.0–10.5)

## 2016-05-08 SURGERY — Surgical Case
Anesthesia: Epidural | Site: Abdomen | Wound class: Clean Contaminated

## 2016-05-08 SURGERY — Surgical Case
Anesthesia: Epidural

## 2016-05-08 SURGERY — Surgical Case

## 2016-05-08 MED ORDER — SIMETHICONE 80 MG PO CHEW
80.0000 mg | CHEWABLE_TABLET | Freq: Three times a day (TID) | ORAL | Status: DC
  Administered 2016-05-09 – 2016-05-10 (×4): 80 mg via ORAL
  Filled 2016-05-08 (×4): qty 1

## 2016-05-08 MED ORDER — LACTATED RINGERS IV SOLN
INTRAVENOUS | Status: DC | PRN
  Administered 2016-05-08: 40 [IU] via INTRAVENOUS

## 2016-05-08 MED ORDER — ZOLPIDEM TARTRATE 5 MG PO TABS: 5.0000 mg | ORAL_TABLET | Freq: Every evening | ORAL | Status: DC | PRN

## 2016-05-08 MED ORDER — DIPHENHYDRAMINE HCL 25 MG PO CAPS: 25.0000 mg | ORAL_CAPSULE | ORAL | Status: DC | PRN

## 2016-05-08 MED ORDER — MEPERIDINE HCL 25 MG/ML IJ SOLN
INTRAMUSCULAR | Status: DC | PRN
  Administered 2016-05-08 (×2): 12.5 mg via INTRAVENOUS

## 2016-05-08 MED ORDER — DIBUCAINE 1 % RE OINT: 1.0000 "application " | TOPICAL_OINTMENT | RECTAL | Status: DC | PRN

## 2016-05-08 MED ORDER — CEFAZOLIN SODIUM-DEXTROSE 2-4 GM/100ML-% IV SOLN
INTRAVENOUS | Status: AC
  Filled 2016-05-08: qty 100

## 2016-05-08 MED ORDER — NALBUPHINE HCL 10 MG/ML IJ SOLN: 5.0000 mg | Freq: Once | INTRAMUSCULAR | Status: DC | PRN

## 2016-05-08 MED ORDER — MORPHINE SULFATE (PF) 0.5 MG/ML IJ SOLN
INTRAMUSCULAR | Status: DC | PRN
  Administered 2016-05-08: 3.5 mg via EPIDURAL

## 2016-05-08 MED ORDER — SIMETHICONE 80 MG PO CHEW
80.0000 mg | CHEWABLE_TABLET | ORAL | Status: DC
  Administered 2016-05-09 – 2016-05-10 (×2): 80 mg via ORAL
  Filled 2016-05-08 (×2): qty 1

## 2016-05-08 MED ORDER — ACETAMINOPHEN 325 MG PO TABS
650.0000 mg | ORAL_TABLET | ORAL | Status: DC | PRN
  Administered 2016-05-08 – 2016-05-10 (×10): 650 mg via ORAL
  Filled 2016-05-08 (×10): qty 2

## 2016-05-08 MED ORDER — SODIUM BICARBONATE 8.4 % IV SOLN
INTRAVENOUS | Status: AC
  Filled 2016-05-08: qty 50

## 2016-05-08 MED ORDER — MEPERIDINE HCL 25 MG/ML IJ SOLN: 6.2500 mg | INTRAMUSCULAR | Status: DC | PRN

## 2016-05-08 MED ORDER — OXYCODONE HCL 5 MG PO TABS
5.0000 mg | ORAL_TABLET | ORAL | Status: DC | PRN
  Administered 2016-05-09: 5 mg via ORAL

## 2016-05-08 MED ORDER — DIPHENHYDRAMINE HCL 50 MG/ML IJ SOLN: 12.5000 mg | INTRAMUSCULAR | Status: DC | PRN

## 2016-05-08 MED ORDER — ONDANSETRON HCL 4 MG/2ML IJ SOLN
INTRAMUSCULAR | Status: AC
  Filled 2016-05-08: qty 2

## 2016-05-08 MED ORDER — LIDOCAINE HCL (PF) 1 % IJ SOLN
INTRAMUSCULAR | Status: DC | PRN
  Administered 2016-05-08: 3 mL via EPIDURAL
  Administered 2016-05-08: 4 mL via EPIDURAL

## 2016-05-08 MED ORDER — OXYCODONE HCL 5 MG PO TABS
10.0000 mg | ORAL_TABLET | ORAL | Status: DC | PRN
  Administered 2016-05-09 – 2016-05-10 (×4): 10 mg via ORAL
  Filled 2016-05-08 (×5): qty 2

## 2016-05-08 MED ORDER — CEFAZOLIN SODIUM-DEXTROSE 2-3 GM-% IV SOLR
INTRAVENOUS | Status: DC | PRN
  Administered 2016-05-08: 2 g via INTRAVENOUS

## 2016-05-08 MED ORDER — LACTATED RINGERS IV SOLN: INTRAVENOUS | Status: DC

## 2016-05-08 MED ORDER — NALOXONE HCL 2 MG/2ML IJ SOSY
1.0000 ug/kg/h | PREFILLED_SYRINGE | INTRAMUSCULAR | Status: DC | PRN
  Filled 2016-05-08: qty 2

## 2016-05-08 MED ORDER — TETANUS-DIPHTH-ACELL PERTUSSIS 5-2.5-18.5 LF-MCG/0.5 IM SUSP: 0.5000 mL | Freq: Once | INTRAMUSCULAR | Status: DC

## 2016-05-08 MED ORDER — MENTHOL 3 MG MT LOZG: 1.0000 | LOZENGE | OROMUCOSAL | Status: DC | PRN

## 2016-05-08 MED ORDER — OXYTOCIN 40 UNITS IN LACTATED RINGERS INFUSION - SIMPLE MED: 2.5000 [IU]/h | INTRAVENOUS | Status: AC

## 2016-05-08 MED ORDER — LACTATED RINGERS IV SOLN
INTRAVENOUS | Status: DC | PRN
  Administered 2016-05-08: 13:00:00 via INTRAVENOUS

## 2016-05-08 MED ORDER — BUPIVACAINE HCL (PF) 0.25 % IJ SOLN
INTRAMUSCULAR | Status: AC
  Filled 2016-05-08: qty 30

## 2016-05-08 MED ORDER — SCOPOLAMINE 1 MG/3DAYS TD PT72
1.0000 | MEDICATED_PATCH | Freq: Once | TRANSDERMAL | Status: DC
  Filled 2016-05-08: qty 1

## 2016-05-08 MED ORDER — OXYTOCIN 40 UNITS IN LACTATED RINGERS INFUSION - SIMPLE MED
1.0000 m[IU]/min | INTRAVENOUS | Status: DC
  Administered 2016-05-08: 2 m[IU]/min via INTRAVENOUS
  Administered 2016-05-08: 4 m[IU]/min via INTRAVENOUS
  Filled 2016-05-08: qty 1000

## 2016-05-08 MED ORDER — COCONUT OIL OIL: 1.0000 "application " | TOPICAL_OIL | Status: DC | PRN

## 2016-05-08 MED ORDER — SODIUM CHLORIDE 0.9% FLUSH: 3.0000 mL | INTRAVENOUS | Status: DC | PRN

## 2016-05-08 MED ORDER — SIMETHICONE 80 MG PO CHEW: 80.0000 mg | CHEWABLE_TABLET | ORAL | Status: DC | PRN

## 2016-05-08 MED ORDER — LIDOCAINE-EPINEPHRINE 2 %-1:100000 IJ SOLN: INTRAMUSCULAR | Status: DC | PRN

## 2016-05-08 MED ORDER — DIPHENHYDRAMINE HCL 25 MG PO CAPS: 25.0000 mg | ORAL_CAPSULE | Freq: Four times a day (QID) | ORAL | Status: DC | PRN

## 2016-05-08 MED ORDER — PHENYLEPHRINE 8 MG IN D5W 100 ML (0.08MG/ML) PREMIX OPTIME
INJECTION | INTRAVENOUS | Status: AC
  Filled 2016-05-08: qty 100

## 2016-05-08 MED ORDER — NALBUPHINE HCL 10 MG/ML IJ SOLN: 5.0000 mg | INTRAMUSCULAR | Status: DC | PRN

## 2016-05-08 MED ORDER — ONDANSETRON HCL 4 MG/2ML IJ SOLN: 4.0000 mg | Freq: Three times a day (TID) | INTRAMUSCULAR | Status: DC | PRN

## 2016-05-08 MED ORDER — BUPIVACAINE HCL 0.25 % IJ SOLN
INTRAMUSCULAR | Status: DC | PRN
  Administered 2016-05-08: 30 mL

## 2016-05-08 MED ORDER — NALOXONE HCL 0.4 MG/ML IJ SOLN: 0.4000 mg | INTRAMUSCULAR | Status: DC | PRN

## 2016-05-08 MED ORDER — OXYTOCIN 10 UNIT/ML IJ SOLN
INTRAMUSCULAR | Status: AC
  Filled 2016-05-08: qty 4

## 2016-05-08 MED ORDER — PRENATAL MULTIVITAMIN CH
1.0000 | ORAL_TABLET | Freq: Every day | ORAL | Status: DC
  Filled 2016-05-08: qty 1

## 2016-05-08 MED ORDER — MEPERIDINE HCL 25 MG/ML IJ SOLN
INTRAMUSCULAR | Status: AC
  Filled 2016-05-08: qty 1

## 2016-05-08 MED ORDER — WITCH HAZEL-GLYCERIN EX PADS: 1.0000 "application " | MEDICATED_PAD | CUTANEOUS | Status: DC | PRN

## 2016-05-08 MED ORDER — SENNOSIDES-DOCUSATE SODIUM 8.6-50 MG PO TABS
2.0000 | ORAL_TABLET | ORAL | Status: DC
  Administered 2016-05-09 – 2016-05-10 (×2): 2 via ORAL
  Filled 2016-05-08 (×2): qty 2

## 2016-05-08 MED ORDER — LACTATED RINGERS IV SOLN
INTRAVENOUS | Status: DC
  Administered 2016-05-08: 10:00:00 via INTRAUTERINE

## 2016-05-08 MED ORDER — MORPHINE SULFATE (PF) 0.5 MG/ML IJ SOLN
INTRAMUSCULAR | Status: AC
  Filled 2016-05-08: qty 10

## 2016-05-08 MED ORDER — LIDOCAINE-EPINEPHRINE (PF) 2 %-1:200000 IJ SOLN
INTRAMUSCULAR | Status: DC | PRN
  Administered 2016-05-08: 10 mL via EPIDURAL

## 2016-05-08 MED ORDER — LIDOCAINE-EPINEPHRINE (PF) 2 %-1:200000 IJ SOLN
INTRAMUSCULAR | Status: AC
  Filled 2016-05-08: qty 20

## 2016-05-08 MED ORDER — SODIUM BICARBONATE 8.4 % IV SOLN: INTRAVENOUS | Status: DC | PRN

## 2016-05-08 MED ORDER — SODIUM CHLORIDE 0.9 % IR SOLN
Status: DC | PRN
  Administered 2016-05-08: 1000 mL

## 2016-05-08 MED ORDER — ONDANSETRON HCL 4 MG/2ML IJ SOLN
INTRAMUSCULAR | Status: DC | PRN
  Administered 2016-05-08: 4 mg via INTRAVENOUS

## 2016-05-08 MED ORDER — LIDOCAINE-EPINEPHRINE (PF) 2 %-1:200000 IJ SOLN
INTRAMUSCULAR | Status: DC | PRN
  Administered 2016-05-08: 5 mL via INTRADERMAL
  Administered 2016-05-08: 2 mL via INTRADERMAL

## 2016-05-08 SURGICAL SUPPLY — 27 items
BENZOIN TINCTURE PRP APPL 2/3 (GAUZE/BANDAGES/DRESSINGS) ×3 IMPLANT
CHLORAPREP W/TINT 26ML (MISCELLANEOUS) ×3 IMPLANT
CLOSURE WOUND 1/2 X4 (GAUZE/BANDAGES/DRESSINGS) ×1
CLOTH BEACON ORANGE TIMEOUT ST (SAFETY) ×3 IMPLANT
DRSG OPSITE POSTOP 4X10 (GAUZE/BANDAGES/DRESSINGS) ×3 IMPLANT
ELECT REM PT RETURN 9FT ADLT (ELECTROSURGICAL) ×3
ELECTRODE REM PT RTRN 9FT ADLT (ELECTROSURGICAL) ×1 IMPLANT
GLOVE BIOGEL PI IND STRL 7.0 (GLOVE) ×3 IMPLANT
GLOVE BIOGEL PI INDICATOR 7.0 (GLOVE) ×6
GLOVE ECLIPSE 6.5 STRL STRAW (GLOVE) ×3 IMPLANT
GOWN STRL REUS W/ TWL LRG LVL3 (GOWN DISPOSABLE) ×2 IMPLANT
GOWN STRL REUS W/TWL LRG LVL3 (GOWN DISPOSABLE) ×4
NEEDLE HYPO 22GX1.5 SAFETY (NEEDLE) ×3 IMPLANT
NS IRRIG 1000ML POUR BTL (IV SOLUTION) ×3 IMPLANT
PAD OB MATERNITY 4.3X12.25 (PERSONAL CARE ITEMS) ×3 IMPLANT
PAD PREP 24X48 CUFFED NSTRL (MISCELLANEOUS) ×3 IMPLANT
RETAINER VISCERAL (MISCELLANEOUS) ×3 IMPLANT
RETRACTOR WND ALEXIS 25 LRG (MISCELLANEOUS) ×1 IMPLANT
RTRCTR WOUND ALEXIS 25CM LRG (MISCELLANEOUS) ×3
STRIP CLOSURE SKIN 1/2X4 (GAUZE/BANDAGES/DRESSINGS) ×2 IMPLANT
SUT PLAIN 2 0 XLH (SUTURE) ×3 IMPLANT
SUT VIC AB 0 CT1 36 (SUTURE) ×6 IMPLANT
SUT VIC AB 2-0 CT1 27 (SUTURE) ×2
SUT VIC AB 2-0 CT1 TAPERPNT 27 (SUTURE) ×1 IMPLANT
SUT VIC AB 4-0 KS 27 (SUTURE) ×3 IMPLANT
SYR CONTROL 10ML LL (SYRINGE) ×3 IMPLANT
TOWEL OR 17X24 6PK STRL BLUE (TOWEL DISPOSABLE) ×9 IMPLANT

## 2016-05-08 NOTE — Consult Note (Signed)
[facility]  Delivery Note:  C-section       05/08/2016  12:35 PM  I was called to the operating room at the request of the patient's obstetrician (Dr. Newton) for a repeat c-section.  PRENATAL HX:  This is a 25 y/o G1P0 at 39 and 2/[redacted] weeks gestation who was admitted 12/18 for IOL due to chronic hypertension.  She is GBS positive and received adequate treatment.  AROM x3.5 hours, clear fluid.  C-section for failure to progress.    DELIVERY:  Infant was vigorous at delivery, requiring no resuscitation other than standard warming, drying and stimulation.  APGARs 8 and 9.  Exam within normal limits.  After 5 minutes, baby left with nurse to assist parents with skin-to-skin care.   _____________________ Electronically Signed By: Marlan Steward, MD Neonatologist  

## 2016-05-08 NOTE — Lactation Note (Signed)
This note was copied from a baby's chart. Lactation Consultation Note  Patient Name: Brittany Cordova ZOXWR'UToday's Date: 05/08/2016 Reason for consult: Initial assessment   Initial assessment with first time mom of 1 hour old infant in PACU. Infant was nursing in the football hold on the left breast with assistance of Nursery RN. Infant feeding vigorously at the breast. Colostrum was easily expressible and squirting out with hand expression. Mom did well with relatching infant when she came off. Infant was still feeding when I left the room.   Mom with large pendulous breasts with everted nipples. Breasts/areola easily compressible. Enc mom to hand express prior to latch. Enc mom to feed infant 8-12 x in 24 hours at first feeding cues. Bf basics and positioning reviewed.  LC Brochure and BF Resources Handout given, mom informed of IP/OP Services, BF Support Groups and LC phone #. Enc mom to call out for feeding assistance as needed.    Maternal Data Formula Feeding for Exclusion: No Has patient been taught Hand Expression?: Yes Does the patient have breastfeeding experience prior to this delivery?: No  Feeding Feeding Type: Breast Fed Length of feed: 20 min  LATCH Score/Interventions Latch: Grasps breast easily, tongue down, lips flanged, rhythmical sucking. Intervention(s): Adjust position;Assist with latch;Breast massage;Breast compression  Audible Swallowing: Spontaneous and intermittent Intervention(s): Skin to skin;Hand expression  Type of Nipple: Everted at rest and after stimulation  Comfort (Breast/Nipple): Soft / non-tender     Hold (Positioning): Assistance needed to correctly position infant at breast and maintain latch. Intervention(s): Support Pillows;Position options;Skin to skin  LATCH Score: 9  Lactation Tools Discussed/Used WIC Program: No (plans to apply)   Consult Status Consult Status: Follow-up Date: 05/08/16 Follow-up type:  In-patient    Brittany Cordova 05/08/2016, 2:59 PM

## 2016-05-08 NOTE — Anesthesia Preprocedure Evaluation (Addendum)
Anesthesia Evaluation  Patient identified by MRN, date of birth, ID band Patient awake    Reviewed: Allergy & Precautions, Patient's Chart, lab work & pertinent test results  Airway Mallampati: III       Dental no notable dental hx. (+) Teeth Intact   Pulmonary asthma , sleep apnea , former smoker,    Pulmonary exam normal breath sounds clear to auscultation       Cardiovascular hypertension, Pt. on medications and Pt. on home beta blockers Normal cardiovascular exam Rhythm:Regular Rate:Normal     Neuro/Psych negative neurological ROS     GI/Hepatic negative GI ROS, Neg liver ROS,   Endo/Other  Morbid obesity  Renal/GU negative Renal ROS  negative genitourinary   Musculoskeletal negative musculoskeletal ROS (+)   Abdominal (+) + obese,   Peds  Hematology  (+) anemia ,   Anesthesia Other Findings   Reproductive/Obstetrics (+) Pregnancy                               Chemistry      Component Value Date/Time   NA 134 (L) 05/06/2016 0847   K 3.8 05/06/2016 0847   CL 104 05/06/2016 0847   CO2 22 05/06/2016 0847   BUN 11 05/06/2016 0847   CREATININE 0.63 05/06/2016 0847      Component Value Date/Time   CALCIUM 9.0 05/06/2016 0847   ALKPHOS 134 (H) 05/06/2016 0847   AST 22 05/06/2016 0847   ALT 12 (L) 05/06/2016 0847   BILITOT 0.2 (L) 05/06/2016 0847     Lab Results  Component Value Date   WBC 8.9 05/08/2016   HGB 10.9 (L) 05/08/2016   HCT 32.7 (L) 05/08/2016   MCV 77.5 (L) 05/08/2016   PLT 206 05/08/2016    Anesthesia Physical Anesthesia Plan  ASA: III  Anesthesia Plan: Epidural   Post-op Pain Management:    Induction:   Airway Management Planned: Natural Airway  Additional Equipment:   Intra-op Plan:   Post-operative Plan: Extubation in OR  Informed Consent: I have reviewed the patients History and Physical, chart, labs and discussed the procedure including  the risks, benefits and alternatives for the proposed anesthesia with the patient or authorized representative who has indicated his/her understanding and acceptance.     Plan Discussed with: Anesthesiologist  Anesthesia Plan Comments:         Anesthesia Quick Evaluation

## 2016-05-08 NOTE — Progress Notes (Signed)
Patient ID: Leone BrandLapoesche Nicole Shrewsberry, female   DOB: Nov 17, 1990, 25 y.o.   MRN: [id number] LABOR PROGRESS NOTE  Jinx Homero Fellersicole Chianese is a 25 y.o. G1P0000 at 3233w2d  admitted for IOL due to cHTN  Subjective: Patient resting comfortably with epidural in place  Objective: BP 110/65   Pulse 83   Temp 98.2 F (36.8 C) (Oral)   Resp 18   Ht 5\' 3"  (1.6 m)   Wt 256 lb (116.1 kg)   LMP 07/31/2015 (Approximate)   SpO2 100%   BMI 45.35 kg/m  or  Vitals:   05/08/16 0650 05/08/16 0651 05/08/16 0700 05/08/16 0701  BP:  136/80  110/65  Pulse: 78 86 83 83  Resp:  18  18  Temp:      TempSrc:      SpO2: 99%  100%   Weight:      Height:         Dilation: 4.5 Effacement (%): 50 Cervical Position: Posterior Station: -1 Presentation: Vertex Exam by:: Cletis MediaK. Anderson, RN  Labs: Lab Results  Component Value Date   WBC 8.9 05/08/2016   HGB 10.9 (L) 05/08/2016   HCT 32.7 (L) 05/08/2016   MCV 77.5 (L) 05/08/2016   PLT 206 05/08/2016    Patient Active Problem List   Diagnosis Date Noted  . Chronic hypertension in obstetric context, third trimester 05/06/2016  . Trichomoniasis 04/30/2016  . GBS (group B Streptococcus carrier), +RV culture, currently pregnant 04/24/2016  . Chronic hypertension affecting pregnancy 04/03/2016  . Rubella non-immune status, antepartum 04/03/2016  . Rh negative, antepartum 02/14/2016  . Supervision of high risk pregnancy, antepartum 02/13/2016  . Obstructive sleep apnea 11/01/2010  . Pruritus 09/30/2010  . ALLERGIC RHINITIS 06/12/2008  . ANEMIA 05/31/2008  . ANGIOEDEMA 05/31/2008    Assessment / Plan: 25 y.o. G1P0000 at 5733w2d here for IOL due to cHTN  Labor: on pit Fetal Wellbeing:  Cat I Pain Control:  epidural Anticipated MOD:  SVD  Clearance CootsAndrew Aliannah Holstrom, MD 05/08/2016, 7:32 AM

## 2016-05-08 NOTE — Op Note (Signed)
Cesarean Section Operative Report  Brittany Cordova  05/06/2016 - 05/08/2016  Indications: Fetal Distress   Pre-operative Diagnosis: Fetal intolerance to labor, IUP at 39 2/7 weeks, Chronic HTN  Post-operative Diagnosis: Same   Surgeon: Surgeon(s) and Role:    * Federico FlakeKimberly Niles Newton, MD - Primary   Attending Attestation: I was present and scrubbed for the entire procedure.   Assistants: Jen MowElizabeth Lawton Dollinger, DO  Anesthesia: epidural    Estimated Blood Loss: 600 ml  Total IV Fluids: 1500 ml LR  Urine Output:: 100 ml clear urine  Specimens: None  Findings: Viable female infant in cephalic OP presentation; Apgars 8/9; weight 5lb 9oz; arterial cord pH 7.163; Clear amniotic fluid; intact placenta with three vessel cord; normal uterus with a fundal fibroid, normal fallopian tubes and ovaries bilaterally.  Baby condition / location:  Couplet care / Skin to Skin   Complications: no complications  Indications: Brittany Cordova is a 25 y.o. G1P1001 with an IUP 3684w2d presenting for IOL for chronic hypertension, had fetal intolerance to labor that required cesarean section.  The risks, benefits, complications, treatment options, and exected outcomes were discussed with the patient . The patient dwith the proposed plan, giving informed consent. identified as Brittany Cordova and the procedure verified as C-Section Delivery.  Procedure Details:  The patient was taken back to the operative suite where epidural anesthesia was bolused.  A time out was held and the above information confirmed.   After induction of anesthesia, the patient was draped and prepped in the usual sterile manner and placed in a dorsal supine position with a leftward tilt. A Pfannenstiel incision was made and carried down through the subcutaneous tissue to the fascia. Fascial incision was made and extended transversely. The fascia was separated from the underlying rectus tissue superiorly and  inferiorly. The peritoneum was identified and entered and extended longitudinally. Alexis retractor was placed. A low transverse uterine incision was made and extended bluntly. Delivered from cephalic OP presentation was a viable infant with Apgars and weight as above.  After waiting 60 seconds for delayed cord cutting, the umbilical cord was clamped and cut cord blood was obtained for evaluation. Cord ph was sent. The placenta was removed Intact and appeared normal. The uterine outline, tubes and ovaries appeared normal. The uterine incision was closed with running locked sutures of 0Vicryl with an imbricating layer of the same.   Hemostasis was observed. The peritoneum was closed with 0 VIcryl. The rectus muscles were examined and hemostasis observed. The fascia was then reapproximated with running sutures of 0Vicryl. A total of 30 ml 0.25% Marcaine was injected subcutaneously at the margins of the incision. The subcuticular closure was performed using 2-0plain gut. The skin was closed with 4-0Vicryl.   Instrument, sponge, and needle counts were correct prior the abdominal closure and were correct at the conclusion of the case.     Disposition: PACU - hemodynamically stable.   Maternal Condition: stable       Signed: Jen MowElizabeth Darrow Barreiro , DO OB Fellow 05/08/2016 2:51 PM

## 2016-05-08 NOTE — Addendum Note (Signed)
Addendum  created 05/08/16 2120 by Elgie CongoNataliya H Itzia Cunliffe, CRNA   Sign clinical note

## 2016-05-08 NOTE — Progress Notes (Signed)
Brittany Cordova is a 25 y.o. G1P0000 at 2539w2d [facility] Patient received [facility], FB, and started on pitocin. She progressed slowly. Fetus started having late decelerations that wdid not resolve with position changes, O2 and stopping pitocin. We attempted to restart pitocin but fetus again had recurrent late variables. The patient was remote from delivery with no cervical change and the decision was made to proceed to CS for fetal intolerance of labor.   The risks of cesarean section discussed with the patient included but were not limited to: bleeding which may require transfusion or reoperation; infection which may require antibiotics; injury to bowel, bladder, ureters or other surrounding organs; injury to the fetus; need for additional procedures including hysterectomy in the event of a life-threatening hemorrhage; placental abnormalities wth subsequent pregnancies, incisional problems, thromboembolic phenomenon and other postoperative/anesthesia complications. The patient concurred with the proposed plan, giving informed written consent for the procedure.   Patient has been NPO since 05/07/2016 she will remain NPO for procedure. Anesthesia and OR aware.  Preoperative prophylactic Ancef ordered on call to the OR.  To OR when ready.  Federico FlakeKimberly Niles Conrad Zajkowski, MD 05/08/2016 11:49 AM

## 2016-05-08 NOTE — Anesthesia Postprocedure Evaluation (Signed)
Anesthesia Post Note  Patient: Leone BrandLapoesche Nicole Siess  Procedure(s) Performed: Procedure(s) (LRB): CESAREAN SECTION (N/A)  Patient location during evaluation: Mother Baby Anesthesia Type: Epidural and Spinal Level of consciousness: awake and alert Pain management: pain level controlled Vital Signs Assessment: post-procedure vital signs reviewed and stable Respiratory status: spontaneous breathing and nonlabored ventilation Cardiovascular status: stable Postop Assessment: no headache, patient able to bend at knees, no backache, no signs of nausea or vomiting, epidural receding, adequate PO intake and spinal receding Anesthetic complications: no        Last Vitals:  Vitals:   05/08/16 1710 05/08/16 1821  BP: (!) 116/58 124/61  Pulse: 83 73  Resp: 18 18  Temp: 37.1 C 37.1 C    Last Pain:  Vitals:   05/08/16 2048  TempSrc:   PainSc: 2    Pain Goal: Patients Stated Pain Goal: 2 (05/08/16 1521)               Laban EmperorMalinova,Sameul Tagle Hristova

## 2016-05-08 NOTE — Progress Notes (Signed)
Patient ID: Brittany Cordova, female   DOB: [birth year], 25 y.o.   MRN: [id number]  S: Patient seen & examined for progress of labor. Patient comfortable with epidural. Discussed with patient regarding plan of labor/induction. Discussed AROM, and possible IUPC/FSE with amnioinfusion. If able to restart pitocin will continue with induction. If baby does not tolerate labor, then discussed with patient about proceeding with cesarean.  Patient is understanding plan and agrees.    O:  Vitals:   05/08/16 0730 05/08/16 0800 05/08/16 0830 05/08/16 0831  BP: 117/62 128/73  127/69  Pulse: 72 85 89 87  Resp: 20     Temp: 98 F (36.7 C)     TempSrc: Oral     SpO2: 100% 99% 100%   Weight:      Height:        Dilation: 4.5 Effacement (%): 80 Cervical Position: Middle Station: -1 Presentation: Vertex Exam by:: Mumaw DO  AROM performed, with minimal clear fluid returned.  IUPC placed with with ease FSE placed with confirmation of placement.  FHT: 150 bpm, mod var, no accels, variable decels with every contraction, with return to baseline.  TOCO: q5715min, rare. (Off pitocin)   A/P: AROM IUPC/FSE Start amnioinfusion. If improved variables, will restart pitocin. Continue expectant management Anticipate SVD

## 2016-05-08 NOTE — Lactation Note (Signed)
This note was copied from a baby's chart. Lactation Consultation Note  Patient Name: Brittany Cordova NWGNF'AToday's Date: 05/08/2016 Reason for consult: Follow-up assessment;Infant < 6lbs   Follow up with mom due to infant weight. Discussed LPT infant policy with mom due to infant size. Enc mom to feed infant at least every 3 hours and to follow BF with hand expression and supplementing infant with spoon. Discussed with starting pump later today after BF to encourage milk to come in and to use as supplement for infant. Colostrum was easily expressible. Infant was actively feeding when I went into the room, 2 cc Colostrum was expressed and left at bedside for next feeding. Mom was able to hand express colostrum. Marcelino DusterMichelle, RN was asked to set up pump this afternoon for mom. Follow up tomorrow and prn.    Maternal Data Formula Feeding for Exclusion: No Has patient been taught Hand Expression?: Yes Does the patient have breastfeeding experience prior to this delivery?: No  Feeding Feeding Type: Breast Fed Length of feed: 25 min  LATCH Score/Interventions Latch: Grasps breast easily, tongue down, lips flanged, rhythmical sucking. Intervention(s): Adjust position;Assist with latch;Breast massage;Breast compression  Audible Swallowing: Spontaneous and intermittent Intervention(s): Skin to skin;Hand expression  Type of Nipple: Everted at rest and after stimulation  Comfort (Breast/Nipple): Soft / non-tender     Hold (Positioning): Assistance needed to correctly position infant at breast and maintain latch. Intervention(s): Breastfeeding basics reviewed;Support Pillows;Position options;Skin to skin  LATCH Score: 9  Lactation Tools Discussed/Used WIC Program: No   Consult Status Consult Status: Follow-up Date: 05/09/16 Follow-up type: In-patient    Brittany Cordova 05/08/2016, 3:54 PM

## 2016-05-08 NOTE — Transfer of Care (Signed)
Immediate Anesthesia Transfer of Care Note  Patient: Leone BrandLapoesche Nicole Supinski  Procedure(s) Performed: Procedure(s): CESAREAN SECTION  Patient Location: PACU  Anesthesia Type:Spinal  Level of Consciousness: awake, alert , oriented and patient cooperative  Airway & Oxygen Therapy: Patient Spontanous Breathing  Post-op Assessment: Report given to RN and Post -op Vital signs reviewed and stable  Post vital signs: Reviewed and stable  Last Vitals:  Vitals:   05/08/16 1145 05/08/16 1200  BP: 139/81 (!) 142/60  Pulse: 91 85  Resp: 20   Temp: 36.8 C     Last Pain:  Vitals:   05/08/16 1200  TempSrc:   PainSc: 0-No pain      Patients Stated Pain Goal: 2 (05/08/16 1100)  Complications: No apparent anesthesia complications

## 2016-05-08 NOTE — Anesthesia Procedure Notes (Signed)
Epidural Patient location during procedure: OB Start time: 05/08/2016 6:35 AM  Staffing Anesthesiologist: Mal AmabileFOSTER, Moncerrat Burnstein Performed: anesthesiologist   Preanesthetic Checklist Completed: patient identified, site marked, surgical consent, pre-op evaluation, timeout performed, IV checked, risks and benefits discussed and monitors and equipment checked  Epidural Patient position: sitting Prep: site prepped and draped and DuraPrep Patient monitoring: continuous pulse ox and blood pressure Approach: midline Location: L4-L5 Injection technique: LOR saline  Needle:  Needle type: Tuohy  Needle gauge: 17 G Needle length: 9 cm and 9 Needle insertion depth: 7 cm Catheter type: closed end flexible Catheter size: 19 Gauge Catheter at skin depth: 13 cm Test dose: negative and Other  Assessment Events: blood not aspirated, injection not painful, no injection resistance, negative IV test and no paresthesia  Additional Notes Patient identified. Risks and benefits discussed including failed block, incomplete  Pain control, post dural puncture headache, nerve damage, paralysis, blood pressure Changes, nausea, vomiting, reactions to medications-both toxic and allergic and post Partum back pain. All questions were answered. Patient expressed understanding and wished to proceed. Sterile technique was used throughout procedure. Epidural site was Dressed with sterile barrier dressing. No paresthesias, signs of intravascular injection Or signs of intrathecal spread were encountered.  Patient was more comfortable after the epidural was dosed. Please see RN's note for documentation of vital signs and FHR which are stable.

## 2016-05-08 NOTE — Progress Notes (Signed)
L&D Note  05/08/2016 - 1:04 AM  25 y.o. G1 5639w2d. Pregnancy complicated by cHTN (no meds), BMI 45, recent h/o trich with treatment, OSA, Rh neg, GBS pos  [name] is admitted for IOL for cHTN   Subjective:  Unchanged.   Objective:   Vitals:   05/07/16 1812 05/07/16 2012 05/07/16 2233 05/08/16 0015  BP: (!) 142/77 (!) 142/88 (!) 131/55 (!) 143/82  Pulse: 95 91 92 87  Resp: 20 18 16 18   Temp: 98.3 F (36.8 C) 98.3 F (36.8 C)    TempSrc: Oral Oral    Weight:      Height:        Current Vital Signs 24h Vital Sign Ranges  T 98.3 F (36.8 C) Temp  Avg: 98 F (36.7 C)  Min: 97.5 F (36.4 C)  Max: 98.4 F (36.9 C)  BP (!) 143/82 BP  Min: 124/71  Max: 148/83  HR 87 Pulse  Avg: 83.1  Min: 74  Max: 95  RR 18 Resp  Avg: 19.4  Min: 16  Max: 22  SaO2   Not Delivered No Data Recorded       24 Hour I/O Current Shift I/O  Time Ins Outs No intake/output data recorded. No intake/output data recorded.   FHR: 145baseline, +accels, no decels, mod var Toco: irregular UCs Gen: NAD with mild distress with UCs SVE: FB out with gentle tugging-->SVE 4/50/-1  Labs:   Recent Labs Lab 05/06/16 0847  WBC 7.9  HGB 10.9*  HCT 32.8*  PLT 213    Recent Labs Lab 05/06/16 0847  NA 134*  K 3.8  CL 104  CO2 22  BUN 11  CREATININE 0.63  CALCIUM 9.0  PROT 7.4  BILITOT 0.2*  ALKPHOS 134*  ALT 12*  AST 22  GLUCOSE 78    Medications Current Facility-Administered Medications  Medication Dose Route Frequency Provider Last Rate Last Dose  . acetaminophen (TYLENOL) tablet 650 mg  650 mg Oral Q4H PRN Arabella MerlesKimberly D Shaw, CNM      . diphenhydrAMINE (BENADRYL) injection 12.5 mg  12.5 mg Intravenous Q15 min PRN Jairo Benarswell Jackson, MD      . ePHEDrine injection 10 mg  10 mg Intravenous PRN Jairo Benarswell Jackson, MD      . ePHEDrine injection 10 mg  10 mg Intravenous PRN Jairo Benarswell Jackson, MD      . fentaNYL (SUBLIMAZE) injection 100 mcg  100 mcg Intravenous Q1H PRN Arabella MerlesKimberly D Shaw,  CNM   100 mcg at 05/08/16 0012  . fentaNYL 2.5 mcg/ml w/bupivacaine 0.1% in NS 100ml epidural infusion (WH-ANES)  14 mL/hr Epidural Continuous PRN Jairo Benarswell Jackson, MD      . lactated ringers infusion 500 mL  500 mL Intravenous Once Jairo Benarswell Jackson, MD      . lactated ringers infusion 500-1,000 mL  500-1,000 mL Intravenous PRN Arabella MerlesKimberly D Shaw, CNM      . lactated ringers infusion   Intravenous Continuous [place]on Bingharlie Santina Trillo, MD 125 mL/hr at 05/07/16 0953    . lidocaine (PF) (XYLOCAINE) 1 % injection 30 mL  30 mL Subcutaneous PRN Arabella MerlesKimberly D Shaw, CNM      . ondansetron (ZOFRAN) injection 4 mg  4 mg Intravenous Q6H PRN Arabella MerlesKimberly D Shaw, CNM      . oxyCODONE-acetaminophen (PERCOCET/ROXICET) 5-325 MG per tablet 1 tablet  1 tablet Oral Q4H PRN Arabella MerlesKimberly D Shaw, CNM      . oxyCODONE-acetaminophen (PERCOCET/ROXICET) 5-325 MG per tablet 2 tablet  2 tablet Oral Q4H  PRN Arabella MerlesKimberly D Shaw, CNM      . oxytocin (PITOCIN) IV BOLUS FROM BAG  500 mL Intravenous Once Arabella MerlesKimberly D Shaw, CNM      . oxytocin (PITOCIN) IV infusion 40 units in LR 1000 mL - Premix  2.5 Units/hr Intravenous Continuous Arabella MerlesKimberly D Shaw, CNM      . penicillin G potassium 3 Million Units in dextrose 50mL IVPB  3 Million Units Intravenous Q4H [place] Bingharlie Reign Bartnick, MD   3 Million Units at 05/07/16 2233  . PHENYLephrine 40 mcg/ml in normal saline Adult IV Push Syringe  80 mcg Intravenous PRN Jairo Benarswell Jackson, MD      . PHENYLephrine 40 mcg/ml in normal saline Adult IV Push Syringe  80 mcg Intravenous PRN Jairo Benarswell Jackson, MD      . sodium citrate-citric acid (ORACIT) solution 30 mL  30 mL Oral Q2H PRN Arabella MerlesKimberly D Shaw, CNM      . terbutaline (BRETHINE) injection 0.25 mg  0.25 mg Subcutaneous Once PRN Arabella MerlesKimberly D Shaw, CNM      . zolpidem (AMBIEN) tablet 5 mg  5 mg Oral QHS PRN Arabella MerlesKimberly D Shaw, CNM   5 mg at 05/06/16 2319    Assessment & Plan:  Pt stable *Pregnancy: category I with accels -normal EFW (28%) with normal AC/AFI on 11/28 *IOL: pt okay to shower  and then will start pitocin per protocol.  -s/p multiple doses of PV and PO miso and s/p FB  *cHTN: mild range BPs only and no s/s of severe features. Normal admit pre-x labs.  *GBS: pos. On PCN *Analgesia: IV PRN  Cornelia Copaharlie Ivory Bail, [name] MD Attending Center [facility](Faculty Practice)

## 2016-05-08 NOTE — Anesthesia Postprocedure Evaluation (Signed)
Anesthesia Post Note  Patient: Brittany Cordova  Procedure(s) Performed: Procedure(s) (LRB): CESAREAN SECTION (N/A)  Patient location during evaluation: PACU Anesthesia Type: Epidural Level of consciousness: awake and alert Pain management: pain level controlled Vital Signs Assessment: post-procedure vital signs reviewed and stable Respiratory status: spontaneous breathing and respiratory function stable Cardiovascular status: blood pressure returned to baseline and stable Postop Assessment: epidural receding Anesthetic complications: no        Last Vitals:  Vitals:   05/08/16 1423 05/08/16 1438  BP: 130/75 (!) 132/93  Pulse: 84 85  Resp: (!) 25 (!) 24  Temp:      Last Pain:  Vitals:   05/08/16 1438  TempSrc:   PainSc: 0-No pain   Pain Goal: Patients Stated Pain Goal: 2 (05/08/16 1100)               Kennieth RadFitzgerald, Audrinna Sherman E

## 2016-05-08 NOTE — Lactation Note (Signed)
This note was copied from a baby's chart. Lactation Consultation Note  Patient Name: Brittany Cordova Reason for consult: Follow-up assessment Baby at 5 hr of life. Baby was sleeping upon entry. Mom was being assessed by the RN and then going to order food. Set up DEBP. Mom understands LPT infant feeding plan. She voiced no concerns at this time. She is aware of lactation services and will call as needed.   Maternal Data Formula Feeding for Exclusion: No Has patient been taught Hand Expression?: Yes Does the patient have breastfeeding experience prior to this delivery?: No  Feeding Feeding Type: Breast Fed Length of feed: 25 min  LATCH Score/Interventions Latch: Grasps breast easily, tongue down, lips flanged, rhythmical sucking. Intervention(s): Adjust position;Assist with latch;Breast massage;Breast compression  Audible Swallowing: Spontaneous and intermittent  Type of Nipple: Everted at rest and after stimulation  Comfort (Breast/Nipple): Soft / non-tender     Hold (Positioning): Assistance needed to correctly position infant at breast and maintain latch. Intervention(s): Breastfeeding basics reviewed;Support Pillows;Position options;Skin to skin  LATCH Score: 9  Lactation Tools Discussed/Used WIC Program: No Pump Review: Setup, frequency, and cleaning;Milk Storage Initiated by:: ES Date initiated:: 05/08/16   Consult Status Consult Status: Follow-up Date: 05/09/16 Follow-up type: In-patient    Rulon Eisenmengerlizabeth E Claudell Rhody Cordova, 6:31 PM

## 2016-05-09 LAB — CBC
HEMATOCRIT: 28 % — AB (ref 36.0–46.0)
Hemoglobin: 9.2 g/dL — ABNORMAL LOW (ref 12.0–15.0)
MCH: 25.6 pg — ABNORMAL LOW (ref 26.0–34.0)
MCHC: 32.9 g/dL (ref 30.0–36.0)
MCV: 77.8 fL — AB (ref 78.0–100.0)
PLATELETS: 179 10*3/uL (ref 150–400)
RBC: 3.6 MIL/uL — AB (ref 3.87–5.11)
RDW: 15.6 % — ABNORMAL HIGH (ref 11.5–15.5)
WBC: 9.2 10*3/uL (ref 4.0–10.5)

## 2016-05-09 MED ORDER — MEASLES, MUMPS & RUBELLA VAC ~~LOC~~ INJ
0.5000 mL | INJECTION | Freq: Once | SUBCUTANEOUS | Status: DC
  Filled 2016-05-09: qty 0.5

## 2016-05-09 MED ORDER — COMPLETENATE 29-1 MG PO CHEW
1.0000 | CHEWABLE_TABLET | Freq: Every day | ORAL | Status: DC
  Administered 2016-05-09 – 2016-05-10 (×2): 1 via ORAL
  Filled 2016-05-09 (×2): qty 1

## 2016-05-09 MED ORDER — AMLODIPINE BESYLATE 5 MG PO TABS
5.0000 mg | ORAL_TABLET | Freq: Every day | ORAL | Status: DC
  Administered 2016-05-09 – 2016-05-10 (×2): 5 mg via ORAL
  Filled 2016-05-09 (×2): qty 1

## 2016-05-09 MED ORDER — RHO D IMMUNE GLOBULIN 1500 UNIT/2ML IJ SOSY
300.0000 ug | PREFILLED_SYRINGE | Freq: Once | INTRAMUSCULAR | Status: AC
  Administered 2016-05-09: 300 ug via INTRAVENOUS
  Filled 2016-05-09: qty 2

## 2016-05-09 MED ORDER — POLYSACCHARIDE IRON COMPLEX 150 MG PO CAPS
150.0000 mg | ORAL_CAPSULE | Freq: Two times a day (BID) | ORAL | Status: DC
  Administered 2016-05-09 – 2016-05-10 (×2): 150 mg via ORAL
  Filled 2016-05-09 (×3): qty 1

## 2016-05-09 NOTE — Progress Notes (Signed)
Mother is undecided about getting Tdap, Flu shot, or Pnuemonia. She is also undecided about her infant having the Hep B shot.

## 2016-05-09 NOTE — Progress Notes (Signed)
Subjective: Postpartum Day #1: Cesarean Delivery Patient reports incisional pain and tolerating PO. Foley removed this AM, has not voided yet.    Objective: Vital signs in last 24 hours: Temp:  [98.1 F (36.7 C)-99.2 F (37.3 C)] 98.1 F (36.7 C) (12/21 0638) Pulse Rate:  [73-98] 91 (12/21 0638) Resp:  [18-25] 18 (12/21 0638) BP: (103-150)/(48-93) 128/64 (12/21 0638) SpO2:  [97 %-100 %] 97 % (12/21 0638)  Physical Exam:  General: alert, cooperative and no distress Lochia: appropriate Uterine Fundus: firm Incision: no significant drainage, no dehiscence, no significant erythema DVT Evaluation: No evidence of DVT seen on physical exam. No cords or calf tenderness. No significant calf/ankle edema.   Recent Labs  05/08/16 0547 05/09/16 0504  HGB 10.9* 9.2*  HCT 32.7* 28.0*    Assessment/Plan: Status post Cesarean section. Doing well postoperatively.  Continue current care. Rhogam work up in process MMR ordered  Iron started for anemia  Rachelle A Denney, CNM 05/09/2016, 7:46 AM   

## 2016-05-09 NOTE — Progress Notes (Signed)
Mother told plan of care. Patient encouraged to ambulate halls , drink fluids and use incentive spirometer. Patient told to call out for help to bathroom if needed. Patient states she is passing gas. Patient seems irritable. Mother also told to feed infant every 3 hours and to feed infant on cues and supplement with EBM and pump.

## 2016-05-09 NOTE — Lactation Note (Signed)
This note was copied from a baby's chart. Lactation Consultation Note  Patient Name: Brittany Cordova'UToday's Date: 05/09/2016 Reason for consult: Follow-up assessment;Infant < 6lbs   Follow up with mom of 28 hour old infant. Infant was latched and feeding when I went into the room, mom reports she has been feeding for 30 minutes. Mom had infant positioned and supported well. Infant was actively nursing with swallows every 2-3 sucks.  Enc mom to continue feeding 8-12 x in 24 hours at first feeding cues with no longer than 3 hours between feeds. Enc mom to pump post BF and offer infant any EBM. Mom reports she last pumped last night and obtained 15 cc colostrum.   Mom reports she has used the syringe and tried the bottle. Mom was concerned infant did not take the bottle well and was tongue thrusting with bottle. Discussed using syringe vs bottle and pros and cons of each. Mom voiced that she wants infant to take the bottle. Enc her to keep trying and infant will figure it out.  Mom voiced concern that infant lost weight last night, discussed normalcy of some weight loss in the NB period and for mom to keep feeding infant on demand, at least every 3 hours and to offer supplement as available.    Mom reports she has noticed a little nipple tenderness with initial latch and when infant pushes back off breast, she reports she relatches infant as needed. Enc her to continue to relatch as needed and to apply EBM to nipples post BF. Mom declined needing assistance at this time. Follow up tomorrow and prn.    Maternal Data Formula Feeding for Exclusion: No Has patient been taught Hand Expression?: Yes Does the patient have breastfeeding experience prior to this delivery?: No  Feeding Feeding Type: Breast Fed Length of feed: 30 min  LATCH Score/Interventions Latch: Grasps breast easily, tongue down, lips flanged, rhythmical sucking. Intervention(s): Breast massage;Breast  compression  Audible Swallowing: Spontaneous and intermittent  Type of Nipple: Everted at rest and after stimulation  Comfort (Breast/Nipple): Soft / non-tender     Hold (Positioning): No assistance needed to correctly position infant at breast. Intervention(s): Breastfeeding basics reviewed;Support Pillows;Position options;Skin to skin  LATCH Score: 10  Lactation Tools Discussed/Used     Consult Status Consult Status: Follow-up Date: 05/10/16 Follow-up type: In-patient    Silas FloodSharon S Sunshyne Horvath 05/09/2016, 5:05 PM

## 2016-05-09 NOTE — Progress Notes (Signed)
Patient was due for a check at 10am. But wanted us to wait . Told her to call out when ready.

## 2016-05-10 LAB — RH IG WORKUP (INCLUDES ABO/RH)
ABO/RH(D): O NEG
FETAL SCREEN: NEGATIVE
Gestational Age(Wks): 39.2
Unit division: 0

## 2016-05-10 MED ORDER — MAGNESIUM HYDROXIDE 400 MG/5ML PO SUSP: 30.0000 mL | Freq: Every day | ORAL | 0 refills | Status: DC

## 2016-05-10 MED ORDER — COMPLETENATE 29-1 MG PO CHEW: 1.0000 | CHEWABLE_TABLET | Freq: Every day | ORAL | 12 refills | Status: DC

## 2016-05-10 MED ORDER — ACETAMINOPHEN 325 MG PO TABS: 650.0000 mg | ORAL_TABLET | ORAL | 99 refills | Status: DC | PRN

## 2016-05-10 MED ORDER — SENNOSIDES-DOCUSATE SODIUM 8.6-50 MG PO TABS: 2.0000 | ORAL_TABLET | Freq: Two times a day (BID) | ORAL | 1 refills | Status: DC

## 2016-05-10 MED ORDER — FUSION PLUS PO CAPS: 1.0000 | ORAL_CAPSULE | Freq: Every day | ORAL | 2 refills | Status: DC

## 2016-05-10 MED ORDER — AMLODIPINE BESYLATE 5 MG PO TABS: 5.0000 mg | ORAL_TABLET | Freq: Every day | ORAL | 12 refills | Status: DC

## 2016-05-10 MED ORDER — OXYCODONE HCL 10 MG PO TABS: 10.0000 mg | ORAL_TABLET | ORAL | 0 refills | Status: DC | PRN

## 2016-05-10 NOTE — Lactation Note (Signed)
This note was copied from a baby's chart. Lactation Consultation Note  Patient Name: Girl Lupita LeashLapoesche Gikas FAOZH'YToday's Date: 05/10/2016 Reason for consult: Follow-up assessment   With this first time mom and term baby, SGA, weight now 5 lbs 4.6 oz. Mom has been exclusively breast feeding, and baby is doing well. I spoke to Dr. Margo AyeHall, and she is discharging the baby to home today. I reviewed with mom breastfeeding with cues, breast/engorgemtnt care, use and care of manual hand pump, how to keep record of how many wet and dirty diapers  the baby is having, and what normal is for age. I advised mom to use hand pump if baby can not soften her breast enough to comfort, once her milk transitions in, and to feed/supplemetn breast feeding  with EBM.I told mom she could use a bottle to supplement, since the baby is just over 5 pounds. I also advised mom to call lactation for any questions/conerns. Mom and MGM and MGGM very receptive to teaching.   Maternal Data    Feeding Feeding Type: Breast Fed Length of feed: 5 min  LATCH Score/Interventions Latch: Grasps breast easily, tongue down, lips flanged, rhythmical sucking.  Audible Swallowing: A few with stimulation  Type of Nipple: Everted at rest and after stimulation  Comfort (Breast/Nipple): Soft / non-tender     Hold (Positioning): No assistance needed to correctly position infant at breast.  LATCH Score: 9  Lactation Tools Discussed/Used     Consult Status Consult Status: Complete Follow-up type: Call as needed    Alfred LevinsLee, Meeka Cartelli Anne 05/10/2016, 11:29 AM

## 2016-05-10 NOTE — Discharge Summary (Signed)
Obstetric Discharge Summary Reason for Admission: induction of labor and for CHTN at 3241w0d Prenatal Procedures: NST and ultrasound Intrapartum Procedures: cesarean: low cervical, transverse Postpartum Procedures: Rho(D) Ig and Rubella Ig Complications-Operative and Postpartum: none Hemoglobin  Date Value Ref Range Status  05/09/2016 9.2 (L) 12.0 - 15.0 g/dL Final   HCT  Date Value Ref Range Status  05/09/2016 28.0 (L) 36.0 - 46.0 % Final   Hematocrit  Date Value Ref Range Status  04/22/2016 32.0 (L) 34.0 - 46.6 % Final  04/22/2016 32.1 (L) 34.0 - 46.6 % Final    Physical Exam:  General: alert, cooperative and no distress Lochia: appropriate Uterine Fundus: firm Incision: healing well, no significant drainage, no dehiscence, no significant erythema DVT Evaluation: No evidence of DVT seen on physical exam. No cords or calf tenderness. No significant calf/ankle edema.  Discharge Diagnoses: Term Pregnancy-delivered and PLTCS, Chronic hypertension  Discharge Information: Date: 05/10/2016 Activity: pelvic rest Diet: routine Medications: PNV, Colace, Iron, Percocet and MOM, Norvasc Condition: stable Instructions: refer to practice specific booklet Discharge to: home Follow-up Information    Brittany Cordova A Carynn Felling, CNM Follow up in 2 week(s).   Specialty:  Certified Nurse Midwife Why:  Incision and blood pressure check in 2 weeks, early discharge home with primary C-section. RD Contact information: [address] [place] [id number] [phone]           Newborn Data: Live born female  Birth Weight: 5 lb 9 oz (2523 g) APGAR: 8, 9  Home with mother.  Brittany Cordova, CNM 05/10/2016, 8:41 AM

## 2016-05-10 NOTE — Progress Notes (Signed)
Subjective: Postpartum Day #2: Cesarean Delivery Patient reports incisional pain, tolerating PO, + flatus and no problems voiding.Reports breast feeding is going well.  Desires to go home today.     Objective: Vital signs in last 24 hours: Temp:  [98.2 F (36.8 C)-98.5 F (36.9 C)] 98.5 F (36.9 C) (12/22 0534) Pulse Rate:  [81-96] 81 (12/22 0534) Resp:  [18] 18 (12/22 0534) BP: (120-132)/(66-69) 120/66 (12/22 0534) SpO2:  [98 %] 98 % (12/21 1100)  Physical Exam:  General: alert, cooperative, no distress and morbidly obese Lochia: appropriate Uterine Fundus: firm Incision: healing well, no significant drainage, no dehiscence, no significant erythema DVT Evaluation: No evidence of DVT seen on physical exam. No cords or calf tenderness. No significant calf/ankle edema.   Recent Labs  05/08/16 0547 05/09/16 0504  HGB 10.9* 9.2*  HCT 32.7* 28.0*    Assessment/Plan: Status post Cesarean section. Doing well postoperatively.  Discharge home with standard precautions and return to clinic in 2 weeks.  Roe Coombsachelle A Asheley Hellberg, CNM 05/10/2016, 8:35 AM

## 2016-05-27 ENCOUNTER — Ambulatory Visit (INDEPENDENT_AMBULATORY_CARE_PROVIDER_SITE_OTHER): Payer: Medicaid Other | Admitting: Obstetrics and Gynecology

## 2016-05-27 ENCOUNTER — Encounter: Payer: Self-pay | Admitting: Obstetrics and Gynecology

## 2016-05-27 VITALS — BP 138/86 | HR 78 | Temp 98.0°F

## 2016-05-27 DIAGNOSIS — R87612 Low grade squamous intraepithelial lesion on cytologic smear of cervix (LGSIL): Secondary | ICD-10-CM | POA: Insufficient documentation

## 2016-05-27 DIAGNOSIS — Z9889 Other specified postprocedural states: Secondary | ICD-10-CM

## 2016-05-27 NOTE — Progress Notes (Signed)
Patient is in the office for pp visit, states that she has been having pain in her incision for the last 3 days. Patient is breastfeeding.

## 2016-05-27 NOTE — Progress Notes (Signed)
26 yo G1P1 s/p PLTCS on 12/20 secondary to NRFHT at 39 weeks presenting today for wound check and BP check. Patient has a history of CHTN and is currently on Norvasc 5 mg daily. Patient reports doing well at home. She is controlling her pain with ibuprofen  Past Medical History:  Diagnosis Date  . Allergic rhinitis, cause unspecified   . 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 Amenorrhea    on BC hormone inj  . Anemia, unspecified   . 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 Asthma    Heat induced  . Dermatitis due to food taken internally   . Hypertension   . Mass    heart   Past Surgical History:  Procedure Laterality Date  . CESAREAN SECTION N/A 05/08/2016   Procedure: CESAREAN SECTION;  Surgeon: Federico FlakeKimberly Niles Newton, MD;  [facility];  Service: Obstetrics;  Laterality: N/A;  . WISDOM TOOTH EXTRACTION     Family History  Problem Relation Age of Onset  . Allergies Father     tomatoes  . Hypertension Father   . Asthma Mother   . Hypertension Mother   . Diabetes Other     grandmother  . Cancer Paternal Grandmother   . Cancer Maternal Uncle     [employer]reat Uncle  . Cancer Other     great aunts  . Diabetes Maternal Grandmother    Social History  Substance Use Topics  . Smoking status: Former Smoker    Packs/day: 0.25    Years: 3.00    Types: Cigars    Quit date: 09/18/2015  . Smokeless tobacco: Never Used  . Alcohol use No   ROS See pertinent in HPI Blood pressure 138/86, pulse 78, temperature 98 F (36.7 C), temperature source Oral, currently breastfeeding.   GENERAL: Well-developed, well-nourished female in no acute distress.  ABDOMEN: Soft, nontender, nondistended.   incision: healing well with a 1.5 cm area centrally located of skin seperation. No erythema, induration or drainage PELVIC: Not performed EXTREMITIES: No cyanosis, clubbing, or edema, 2+ distal pulses.  A/P 26 yo G1P1 here for wound and BP check s/p c-section on 05/08/16 - Incision is healing  well. Silverdene applied to the area and steri-strips re-applied. Wound care instructions given - continue pain management with ibuprofen - Continue management of hypertension with Norvasc - RTC in 3 weeks for pp check and colposcopy

## 2016-06-15 ENCOUNTER — Other Ambulatory Visit: Payer: Self-pay | Admitting: Obstetrics & Gynecology

## 2016-06-19 ENCOUNTER — Ambulatory Visit (INDEPENDENT_AMBULATORY_CARE_PROVIDER_SITE_OTHER): Payer: Medicaid Other | Admitting: Obstetrics and Gynecology

## 2016-06-19 DIAGNOSIS — R87612 Low grade squamous intraepithelial lesion on cytologic smear of cervix (LGSIL): Secondary | ICD-10-CM

## 2016-06-19 MED ORDER — COMPLETENATE 29-1 MG PO CHEW: 1.0000 | CHEWABLE_TABLET | Freq: Every day | ORAL | 12 refills | Status: DC

## 2016-06-19 NOTE — Progress Notes (Signed)
Subjective:     Brittany Cordova is a 26 y.o. female who presents for a postpartum visit. She is 5 weeks postpartum following a low cervical transverse Cesarean section. I have fully reviewed the prenatal and intrapartum course. The delivery was at 39 gestational weeks. Outcome: repeat cesarean section, low transverse incision. Anesthesia: epidural. Postpartum course has been uncomplicated. Patient has CHTN currently on Norvasc. Baby's course has been uncomplicated. Baby is feeding by both breast and bottle - Similac Advance. Bleeding no bleeding. Bowel function is normal. Bladder function is normal. Patient is not sexually active. Contraception method is abstinence. Postpartum depression screening: negative.     Review of Systems Pertinent items are noted in HPI.   Objective:    BP 140/90   Pulse 67   Temp 97.3 F (36.3 C) (Oral)   Wt 245 lb 3.2 oz (111.2 kg)   BMI 43.44 kg/m   General:  alert, cooperative and no distress   Breasts:  inspection negative, no nipple discharge or bleeding, no masses or nodularity palpable  Lungs: clear to auscultation bilaterally  Heart:  regular rate and rhythm  Abdomen: soft, non-tender; bowel sounds normal; no masses,  no organomegaly  Incision: completely healed   Vulva:  normal  Vagina: normal vagina, no discharge, exudate, lesion, or erythema  Cervix:  multiparous appearance  Corpus: normal size, contour, position, consistency, mobility, non-tender  Adnexa:  normal adnexa and no mass, fullness, tenderness  Rectal Exam: Not performed.        Assessment:     Normal postpartum exam. Colpo done at today's visit.   Plan:    1. Contraception: abstinence 2. Patient with LGSIL Patient given informed consent, signed copy in the chart, time out was performed.  Placed in lithotomy position. Cervix viewed with speculum and colposcope after application of acetic acid.   Colposcopy adequate?  yes Acetowhite lesions? no Punctation?  no Mosaicism?  no Abnormal vasculature?  no Biopsies? no ECC? no  Patient to follow up in 6 months for repeat pap smear  3. Patient is medically cleared to resume activities of daily living  4. Follow up in: 6 months or as needed.

## 2016-12-23 IMAGING — US US OB TRANSVAGINAL
1 series · 14 of 28 positions shown · non-contrast
Comparison: Ultrasound January 21, 2015.

CLINICAL DATA: Acute generalized abdominal pain, first trimester of
pregnancy.

EXAM:
OBSTETRIC <14 WK US AND TRANSVAGINAL OB US
TECHNIQUE: Both transabdominal and transvaginal ultrasound examinations were
performed for complete evaluation of the gestation as well as the
maternal uterus, adnexal regions, and pelvic cul-de-sac.
Transvaginal technique was performed to assess early pregnancy.

[Series 1: us ob transvaginal · 0.20mm/px · 14 of 64 slices shown]
[im 3/64]
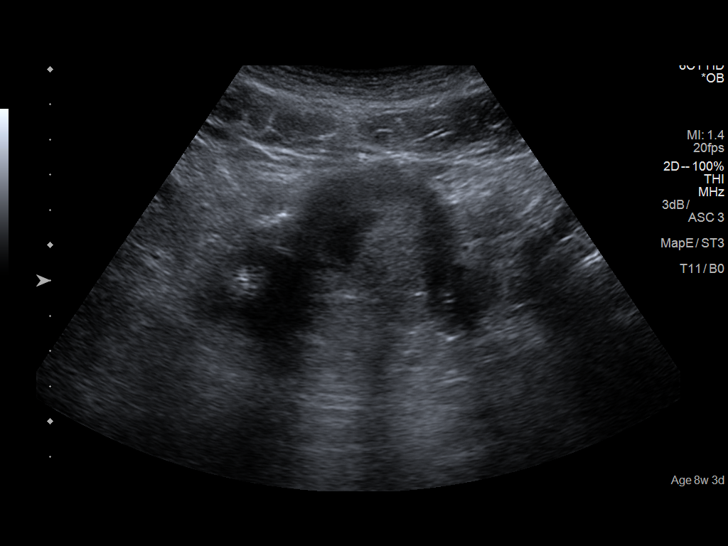
[im 8/64]
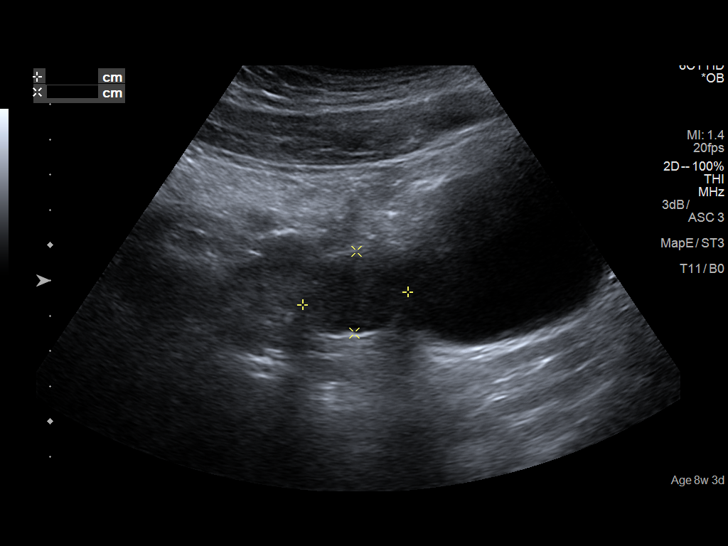
[im 12/64]
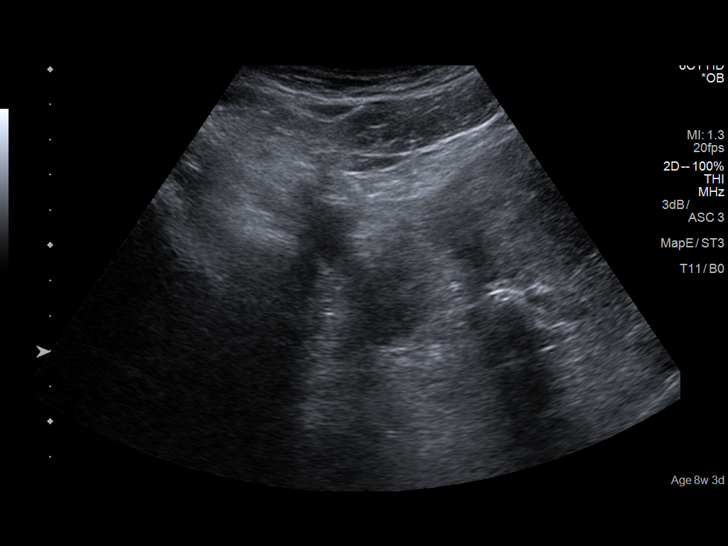
[im 17/64]
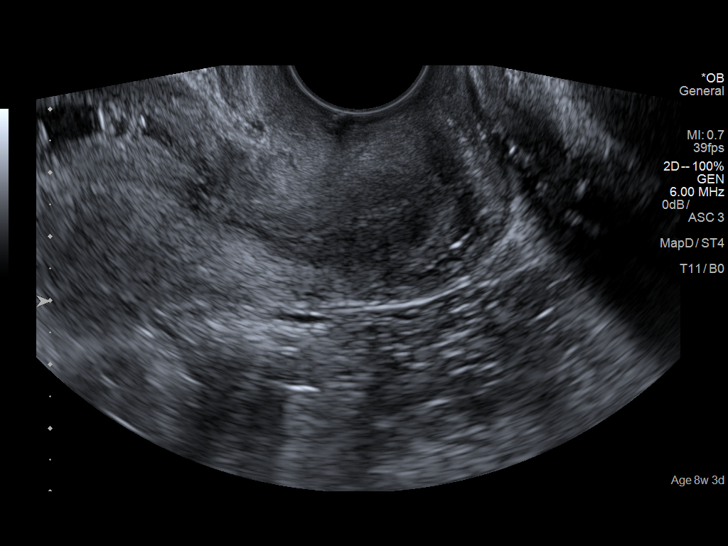
[im 22/64]
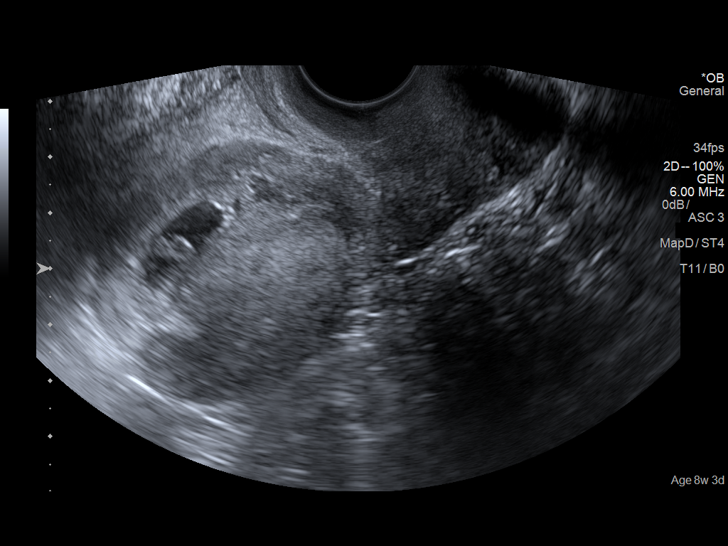
[im 26/64]
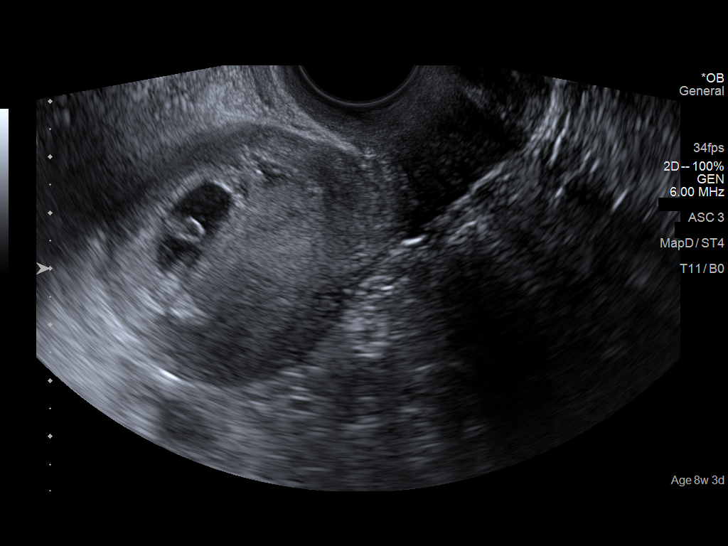
[im 31/64]
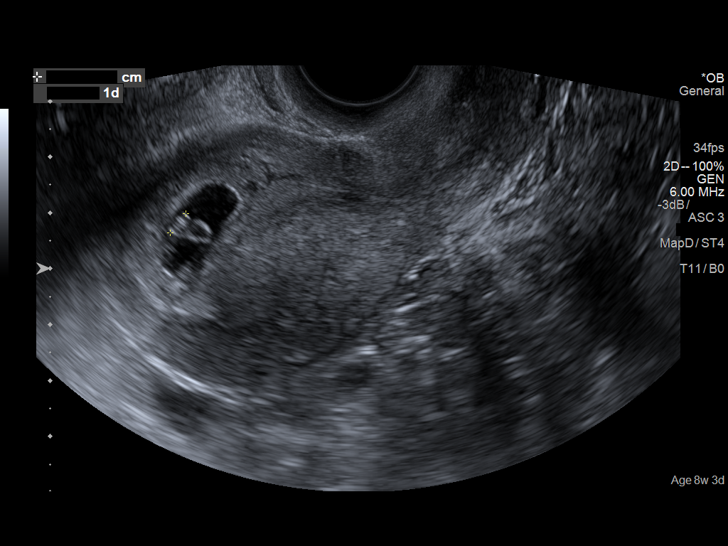
[im 36/64]
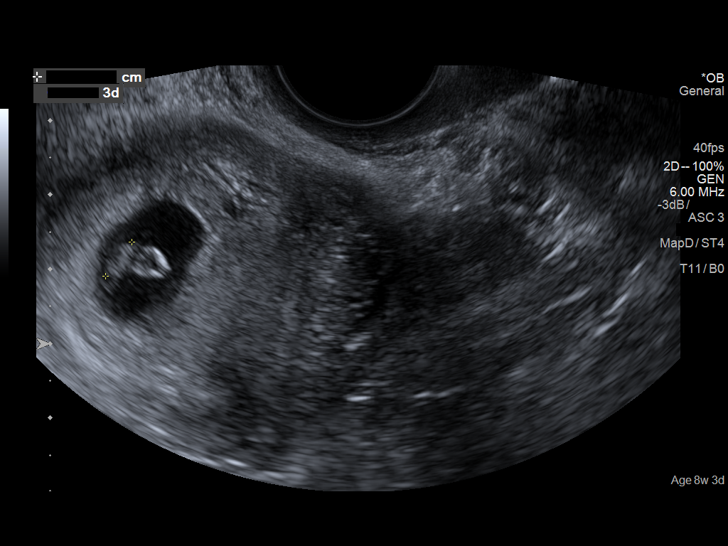
[im 40/64]
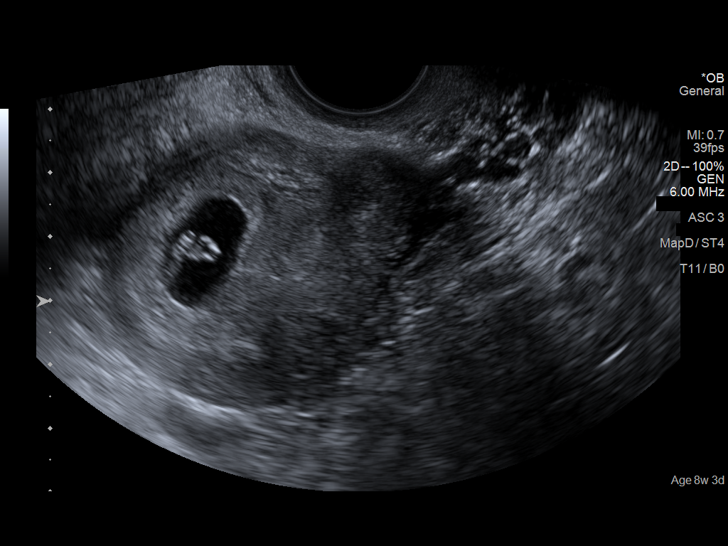
[im 45/64]
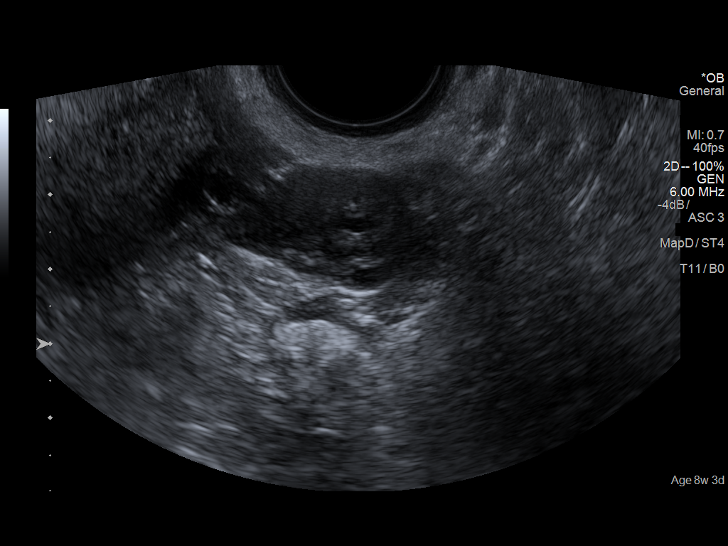
[im 50/64]
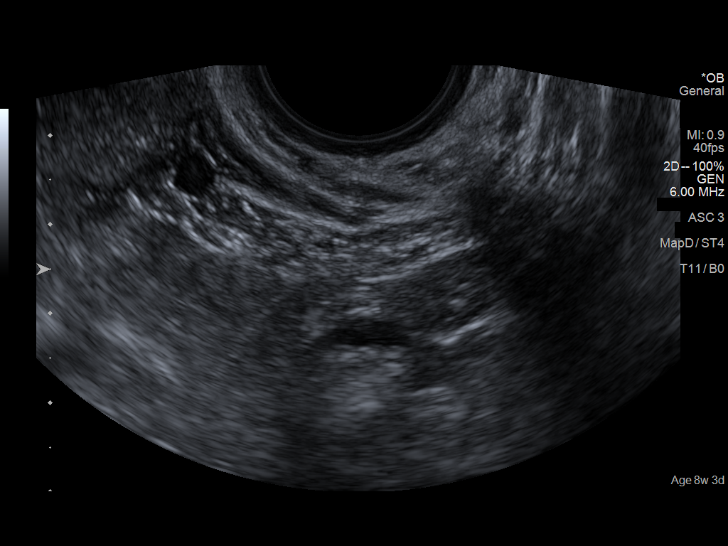
[im 54/64]
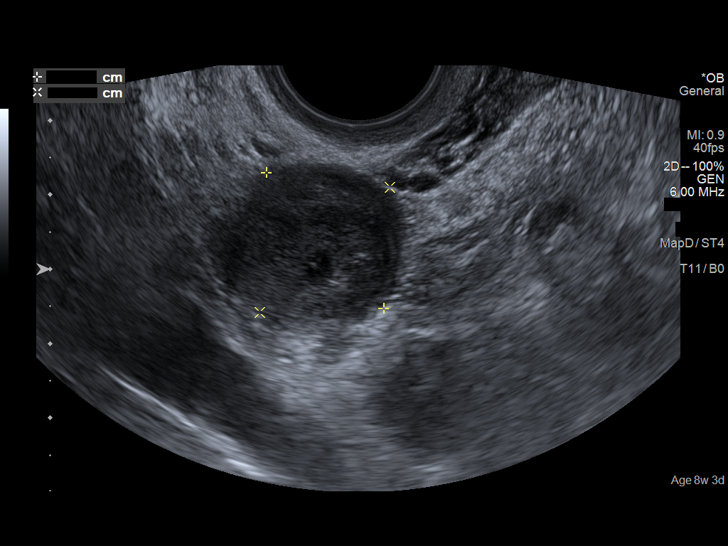
[im 59/64]
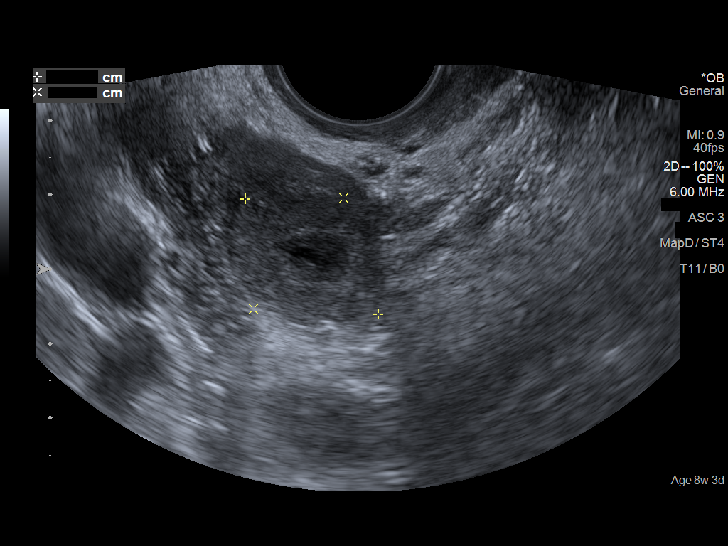
[im 64/64]
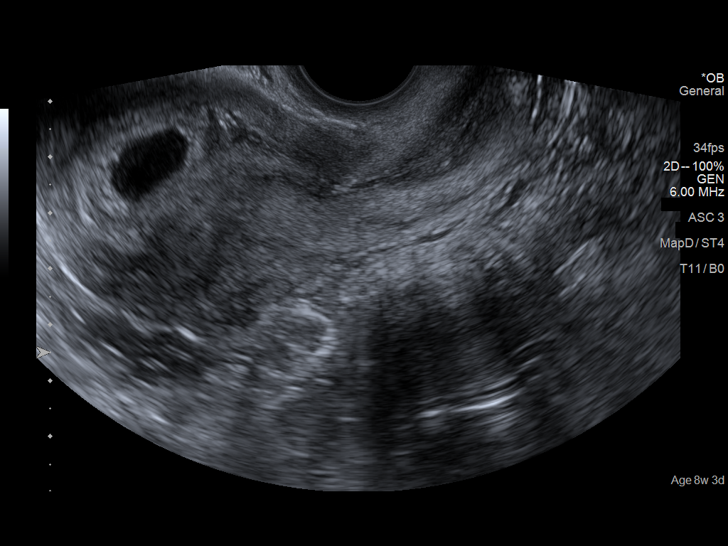

[14 of 28 positions shown; findings below may reference images not displayed]

FINDINGS: Intrauterine gestational sac: Single.

Yolk sac:  Visualized.

Embryo:  Visualized.

Cardiac Activity: Visualized.

Heart Rate: 110  bpm

CRL: 6.5 mm 6 w 3 d US EDC: May 13, 2016.

Subchorionic hemorrhage:  None visualized.

Maternal uterus/adnexae: Probable corpus luteum cyst seen in right
ovary. Left ovary appears normal. No free fluid is noted.
IMPRESSION: Single live intrauterine gestation of 6 weeks 3 days.

## 2017-03-19 ENCOUNTER — Ambulatory Visit (HOSPITAL_COMMUNITY)
Admission: EM | Admit: 2017-03-19 | Discharge: 2017-03-19 | Disposition: A | Discharge status: Home / Self Care | Payer: Self-pay | Attending: Family Medicine | Admitting: Family Medicine

## 2017-03-19 ENCOUNTER — Encounter (HOSPITAL_COMMUNITY): Payer: Self-pay | Admitting: *Deleted

## 2017-03-19 DIAGNOSIS — H1031 Unspecified acute conjunctivitis, right eye: Secondary | ICD-10-CM

## 2017-03-19 MED ORDER — KETOTIFEN FUMARATE 0.025 % OP SOLN: 1.0000 [drp] | Freq: Two times a day (BID) | OPHTHALMIC | 0 refills | Status: DC

## 2017-03-19 MED ORDER — POLYMYXIN B-TRIMETHOPRIM 10000-0.1 UNIT/ML-% OP SOLN: 1.0000 [drp] | OPHTHALMIC | 0 refills | Status: DC

## 2017-03-19 NOTE — Discharge Instructions (Signed)
Use warm water compresses to the eye several times a day, especially in the morning. Use antibiotic drops as directed. The other bottle medicine for the eye contains the anti-inflammatory medicine. Use 1 drop first. If the child develops swelling, redness or discomfort then do not use it anymore.

## 2017-03-19 NOTE — ED Triage Notes (Addendum)
Patient reports right eye redness x 1 day. Mild blurred vision with drainage. Patient works at a day care.

## 2017-03-19 NOTE — ED Provider Notes (Signed)
[facility]    CSN: [id number] Arrival date & time: 03/19/17  1918     History   Chief Complaint Chief Complaint  Patient presents with  . Eye Problem    HPI Brittany Cordova is a 26 y.o. female.   27 year old female states that sometime after 3:00 this afternoon she noticed some redness, burning and itching to the right eye. She also noticed some thick green discharge. Left eye unaffected. No other complaints      Past Medical History:  Diagnosis Date  . Allergic rhinitis, cause unspecified   . 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 Amenorrhea    on BC hormone inj  . Anemia, unspecified   . 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 Asthma    Heat induced  . Dermatitis due to food taken internally   . Hypertension   . Mass    heart    Patient Active Problem List   Diagnosis Date Noted  . LGSIL on Pap smear of cervix 05/27/2016  . S/P primary low transverse C-section 05/08/2016  . Chronic hypertension in obstetric context, third trimester 05/06/2016  . Trichomoniasis 04/30/2016  . GBS (group B Streptococcus carrier), +RV culture, currently pregnant 04/24/2016  . Chronic hypertension affecting pregnancy 04/03/2016  . Rubella non-immune status, antepartum 04/03/2016  . Rh negative, antepartum 02/14/2016  . Supervision of high risk pregnancy, antepartum 02/13/2016  . Obstructive sleep apnea 11/01/2010  . Pruritus 09/30/2010  . ALLERGIC RHINITIS 06/12/2008  . ANEMIA 05/31/2008  . ANGIOEDEMA 05/31/2008    Past Surgical History:  Procedure Laterality Date  . CESAREAN SECTION N/A 05/08/2016   Procedure: CESAREAN SECTION;  Surgeon: Federico Flake, MD;  Location: [facility] BIRTHING SUITES;  Service: Obstetrics;  Laterality: N/A;  . WISDOM TOOTH EXTRACTION      OB History    Gravida Para Term Preterm AB Living   1 1 1  0 0 1   SAB TAB Ectopic Multiple Live Births   0 0 0 0 1       Home Medications    Prior to Admission medications   Medication  Sig Start Date End Date Taking? Authorizing Provider  prenatal vitamin w/FE, FA (NATACHEW) 29-1 MG CHEW chewable tablet Chew 1 tablet by mouth daily at 12 noon. 06/19/16  Yes Constant, Peggy, MD  amLODipine (NORVASC) 5 MG tablet Take 1 tablet (5 mg total) by mouth daily. 05/10/16   Orvilla Cornwall A, CNM  ketotifen (ZADITOR) 0.025 % ophthalmic solution Place 1 drop into the right eye 2 (two) times daily. 03/19/17   Hayden Rasmussen, NP  senna-docusate (SENOKOT-S) 8.6-50 MG tablet Take 2 tablets by mouth 2 (two) times daily. 05/10/16   Orvilla Cornwall A, CNM  trimethoprim-polymyxin b (POLYTRIM) ophthalmic solution Place 1 drop into the right eye every 4 (four) hours. 03/19/17   Hayden Rasmussen, NP    Family History Family History  Problem Relation Age of Onset  . Allergies Father        tomatoes  . Hypertension Father   . Asthma Mother   . Hypertension Mother   . Diabetes Other        grandmother  . Cancer Paternal Grandmother   . Cancer Maternal Uncle        [employer]  . Cancer Other        great aunts  . Diabetes Maternal Grandmother     Social History Social History  Substance Use Topics  . Smoking status: Former Smoker  Packs/day: 0.25    Years: 3.00    Types: Cigars    Quit date: 09/18/2015  . Smokeless tobacco: Never Used  . Alcohol use No     Allergies   Ibuprofen   Review of Systems Review of Systems  Constitutional: Negative.   HENT: Negative.   Eyes: Positive for photophobia, discharge, redness and itching.  Respiratory: Negative.   All other systems reviewed and are negative.    Physical Exam Triage Vital Signs ED Triage Vitals  Enc Vitals Group     BP 03/19/17 1942 (!) 189/123     Pulse Rate 03/19/17 1942 89     Resp 03/19/17 1942 17     Temp 03/19/17 1942 98.9 F (37.2 C)     Temp Source 03/19/17 1942 Oral     SpO2 03/19/17 1942 96 %     Weight --      Height --      Head Circumference --      Peak Flow --      Pain Score 03/19/17 1934 7      Pain Loc --      Pain Edu? --      Excl. in GC? --    No data found.   Updated Vital Signs BP (!) 189/123 (BP Location: Right Arm)   Pulse 89   Temp 98.9 F (37.2 C) (Oral)   Resp 17   SpO2 96%   Visual Acuity Right Eye Distance:   Left Eye Distance:   Bilateral Distance:    Right Eye Near:   Left Eye Near:    Bilateral Near:     Physical Exam  Constitutional: She is oriented to person, place, and time. She appears well-developed and well-nourished. No distress.  Eyes: Pupils are equal, round, and reactive to light. EOM are normal.  Right conjunctiva with minor erythema and minor swelling. One small drop of thick drainage in the medial canthus. Anterior chamber clear. No foreign body seen.  Neck: Normal range of motion. Neck supple.  Cardiovascular: Normal rate.   Pulmonary/Chest: Effort normal. No respiratory distress.  Musculoskeletal: She exhibits no edema.  Neurological: She is alert and oriented to person, place, and time. She exhibits normal muscle tone.  Skin: Skin is warm and dry.  Psychiatric: She has a normal mood and affect.  Nursing note and vitals reviewed.    UC Treatments / Results  Labs (all labs ordered are listed, but only abnormal results are displayed) Labs Reviewed - No data to display  EKG  EKG Interpretation None       Radiology No results found.  Procedures Procedures (including critical care time)  Medications Ordered in UC Medications - No data to display   Initial Impression / Assessment and Plan / UC Course  I have reviewed the triage vital signs and the nursing notes.  Pertinent labs & imaging results that were available during my care of the patient were reviewed by me and considered in my medical decision making (see chart for details).    Use warm water compresses to the eye several times a day, especially in the morning. Use antibiotic drops as directed. The other bottle medicine for the eye contains the  anti-inflammatory medicine. Use 1 drop first. If the child develops swelling, redness or discomfort then do not use it anymore.    Final Clinical Impressions(s) / UC Diagnoses   Final diagnoses:  Acute conjunctivitis of right eye, unspecified acute conjunctivitis type    New Prescriptions  New Prescriptions   KETOTIFEN (ZADITOR) 0.025 % OPHTHALMIC SOLUTION    Place 1 drop into the right eye 2 (two) times daily.   TRIMETHOPRIM-POLYMYXIN B (POLYTRIM) OPHTHALMIC SOLUTION    Place 1 drop into the right eye every 4 (four) hours.     Controlled Substance Prescriptions [place] Controlled Substance Registry consulted? Not Applicable   Hayden Rasmussen, NP 03/19/17 2014

## 2017-03-19 NOTE — ED Notes (Signed)
Patient states she was on lisinopril but has not been back to the doctor. Patient I discussed importance of following up with doctor to see if she needs to be placed back on meds.

## 2017-05-20 NOTE — L&D Delivery Note (Addendum)
IOL for FGR, borderline oligohydramnios, and cHTN.  Pt received a foley, pitocin, and AROM. 10 minute second stage. Patient had a fever of 101.5 just prior to delivery; however, the baby delivered before antibiotics administered.  Delivery Note At 10:36 PM a viable female was delivered (cephalic LOA) via VBAC. APGAR: 9, 9; weight:4# 13 oz. After 1 minute cord was clamped and cut. It was delivered via CCT and maternal pushing effort, intact w/ a 3VC.  40 milliunits of pitocin diluted in 1000 cc LR infused rapidly IV.  Anesthesia: epidural Lacerations: 1st degree Suture Repair: 2.0 vicryl Est. Blood Loss (mL): 300  Mom to postpartum.  Baby to Couplet care / Skin to Skin.  Candie MileLindsey R Mitchell PA-S 12/04/2017, 10:59 PM   The above was performed under my direct supervision and guidance. '

## 2017-05-23 ENCOUNTER — Inpatient Hospital Stay (HOSPITAL_COMMUNITY): Payer: Self-pay

## 2017-05-23 ENCOUNTER — Encounter (HOSPITAL_COMMUNITY): Payer: Self-pay | Admitting: *Deleted

## 2017-05-23 ENCOUNTER — Inpatient Hospital Stay (HOSPITAL_COMMUNITY)
Admission: AD | Admit: 2017-05-23 | Discharge: 2017-05-23 | Disposition: A | Discharge status: Home / Self Care | Payer: Self-pay | Source: Ambulatory Visit | Attending: Obstetrics & Gynecology | Admitting: Obstetrics & Gynecology

## 2017-05-23 DIAGNOSIS — O26891 Other specified pregnancy related conditions, first trimester: Secondary | ICD-10-CM | POA: Insufficient documentation

## 2017-05-23 DIAGNOSIS — Z3A09 9 weeks gestation of pregnancy: Secondary | ICD-10-CM

## 2017-05-23 DIAGNOSIS — Z3A08 8 weeks gestation of pregnancy: Secondary | ICD-10-CM | POA: Insufficient documentation

## 2017-05-23 DIAGNOSIS — Z3491 Encounter for supervision of normal pregnancy, unspecified, first trimester: Secondary | ICD-10-CM

## 2017-05-23 DIAGNOSIS — O468X1 Other antepartum hemorrhage, first trimester: Secondary | ICD-10-CM

## 2017-05-23 DIAGNOSIS — R102 Pelvic and perineal pain: Secondary | ICD-10-CM | POA: Insufficient documentation

## 2017-05-23 DIAGNOSIS — O26899 Other specified pregnancy related conditions, unspecified trimester: Secondary | ICD-10-CM

## 2017-05-23 DIAGNOSIS — R109 Unspecified abdominal pain: Secondary | ICD-10-CM

## 2017-05-23 DIAGNOSIS — O418X10 Other specified disorders of amniotic fluid and membranes, first trimester, not applicable or unspecified: Secondary | ICD-10-CM

## 2017-05-23 LAB — CBC
HEMATOCRIT: 35.7 % — AB (ref 36.0–46.0)
HEMOGLOBIN: 11.7 g/dL — AB (ref 12.0–15.0)
MCH: 24.3 pg — AB (ref 26.0–34.0)
MCHC: 32.8 g/dL (ref 30.0–36.0)
MCV: 74.2 fL — ABNORMAL LOW (ref 78.0–100.0)
Platelets: 265 10*3/uL (ref 150–400)
RBC: 4.81 MIL/uL (ref 3.87–5.11)
RDW: 16.6 % — AB (ref 11.5–15.5)
WBC: 9.2 10*3/uL (ref 4.0–10.5)

## 2017-05-23 LAB — URINALYSIS, ROUTINE W REFLEX MICROSCOPIC
Bilirubin Urine: NEGATIVE
Glucose, UA: NEGATIVE mg/dL
Hgb urine dipstick: NEGATIVE
Ketones, ur: 20 mg/dL — AB
Nitrite: NEGATIVE
Protein, ur: NEGATIVE mg/dL
Specific Gravity, Urine: 1.028 (ref 1.005–1.030)
pH: 5 (ref 5.0–8.0)

## 2017-05-23 LAB — ABO/RH: ABO/RH(D): O NEG

## 2017-05-23 LAB — HCG, QUANTITATIVE, PREGNANCY: hCG, Beta Chain, Quant, S: 82681 m[IU]/mL — ABNORMAL HIGH (ref <5)

## 2017-05-23 LAB — POCT PREGNANCY, URINE: Preg Test, Ur: POSITIVE — AB

## 2017-05-23 MED ORDER — COMPLETENATE 29-1 MG PO CHEW: 1.0000 | CHEWABLE_TABLET | Freq: Every day | ORAL | 12 refills | Status: DC

## 2017-05-23 NOTE — Discharge Instructions (Signed)

## 2017-05-23 NOTE — MAU Note (Signed)
Pt reports bilateral lower abd pain for a week, hurts to move. Reports some nausea but no vomiting. Denies dysuria.

## 2017-05-23 NOTE — MAU Provider Note (Signed)
History     CSN: [id number]  Arrival date and time: 05/23/17 1157  Chief Complaint  Patient presents with  . Abdominal Pain  . Nausea   HPI Brittany Cordova is a 27 y.o. G2P1001 at [redacted]w[redacted]d who presents with lower abdominal pain. She reports pain for the last week and rates the pain a 6/10. She has not tried anything for the pain. She denies any vaginal bleeding or discharge. She reports intermittent nausea but no vomiting. She reports irregular periods since the birth of her daughter a year ago and is unsure about her LMP. Reports one instance of unprotected intercourse.   OB History    Gravida Para Term Preterm AB Living   2 1 1  0 0 1   SAB TAB Ectopic Multiple Live Births   0 0 0 0 1      Past Medical History:  Diagnosis Date  . Allergic rhinitis, cause unspecified   . 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 Amenorrhea    on BC hormone inj  . Anemia, unspecified   . 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 Asthma    Heat induced  . Dermatitis due to food taken internally   . Hypertension   . Mass    heart    Past Surgical History:  Procedure Laterality Date  . CESAREAN SECTION N/A 05/08/2016   Procedure: CESAREAN SECTION;  Surgeon: Federico Flake, MD;  Location: [facility] BIRTHING SUITES;  Service: Obstetrics;  Laterality: N/A;  . WISDOM TOOTH EXTRACTION      Family History  Problem Relation Age of Onset  . Allergies Father        tomatoes  . Hypertension Father   . Asthma Mother   . Hypertension Mother   . Diabetes Other        grandmother  . Cancer Paternal Grandmother   . Cancer Maternal Uncle        [employer]  . Cancer Other        great aunts  . Diabetes Maternal Grandmother     Social History   Tobacco Use  . Smoking status: Former Smoker    Packs/day: 0.25    Years: 3.00    Pack years: 0.75    Types: Cigars    Last attempt to quit: 09/18/2015    Years since quitting: 1.6  . Smokeless tobacco: Never Used  Substance Use Topics  . Alcohol use:  No  . Drug use: Yes    Types: Marijuana    Allergies:  Allergies  Allergen Reactions  . Ibuprofen Anaphylaxis and Swelling    Medications Prior to Admission  Medication Sig Dispense Refill Last Dose  . amLODipine (NORVASC) 5 MG tablet Take 1 tablet (5 mg total) by mouth daily. 30 tablet 12 Taking  . ketotifen (ZADITOR) 0.025 % ophthalmic solution Place 1 drop into the right eye 2 (two) times daily. 5 mL 0   . senna-docusate (SENOKOT-S) 8.6-50 MG tablet Take 2 tablets by mouth 2 (two) times daily. 90 tablet 1 Taking  . trimethoprim-polymyxin b (POLYTRIM) ophthalmic solution Place 1 drop into the right eye every 4 (four) hours. 10 mL 0     Review of Systems Physical Exam   Blood pressure (!) 135/93, pulse 92, temperature 98.6 F (37 C), temperature source Oral, resp. rate 16, height 5\' 3"  (1.6 m), weight 253 lb (114.8 kg), last menstrual period 03/16/2017, SpO2 100 %, currently breastfeeding.  Physical Exam  MAU Course  Procedures Results for orders  placed or performed during the hospital encounter of 05/23/17 (from the past 24 hour(s))  Urinalysis, Routine w reflex microscopic     Status: Abnormal   Collection Time: 05/23/17 12:06 PM  Result Value Ref Range   Color, Urine YELLOW YELLOW   APPearance CLOUDY (A) CLEAR   Specific Gravity, Urine 1.028 1.005 - 1.030   pH 5.0 5.0 - 8.0   Glucose, UA NEGATIVE NEGATIVE mg/dL   Hgb urine dipstick NEGATIVE NEGATIVE   Bilirubin Urine NEGATIVE NEGATIVE   Ketones, ur 20 (A) NEGATIVE mg/dL   Protein, ur NEGATIVE NEGATIVE mg/dL   Nitrite NEGATIVE NEGATIVE   Leukocytes, UA TRACE (A) NEGATIVE   RBC / HPF 0-5 0 - 5 RBC/hpf   WBC, UA 6-30 0 - 5 WBC/hpf   Bacteria, UA RARE (A) NONE SEEN   Squamous Epithelial / LPF 6-30 (A) NONE SEEN   Mucus PRESENT   Pregnancy, urine POC     Status: Abnormal   Collection Time: 05/23/17 12:22 PM  Result Value Ref Range   Preg Test, Ur POSITIVE (A) NEGATIVE  CBC     Status: Abnormal   Collection Time:  05/23/17  1:47 PM  Result Value Ref Range   WBC 9.2 4.0 - 10.5 K/uL   RBC 4.81 3.87 - 5.11 MIL/uL   Hemoglobin 11.7 (L) 12.0 - 15.0 g/dL   HCT 16.1 (L) 09.6 - 04.5 %   MCV 74.2 (L) 78.0 - 100.0 fL   MCH 24.3 (L) 26.0 - 34.0 pg   MCHC 32.8 30.0 - 36.0 g/dL   RDW 40.9 (H) 81.1 - 91.4 %   Platelets 265 150 - 400 K/uL  hCG, quantitative, pregnancy     Status: Abnormal   Collection Time: 05/23/17  1:47 PM  Result Value Ref Range   hCG, Beta Chain, Quant, S 82,681 (H) <5 mIU/mL  ABO/Rh     Status: None   Collection Time: 05/23/17  1:47 PM  Result Value Ref Range   ABO/RH(D) O NEG    US Ob Comp Less 14 Wks  Result Date: 05/23/2017 CLINICAL DATA:  Abdominal pain during pregnancy. EXAM: OBSTETRIC <14 WK ULTRASOUND TECHNIQUE: Transabdominal ultrasound was performed for evaluation of the gestation as well as the maternal uterus and adnexal regions. COMPARISON:  None. FINDINGS: Intrauterine gestational sac: Single Yolk sac:  Yes Embryo:  Yes Cardiac Activity: Yes Heart Rate: 180 bpm CRL:   26  mm   9 w 3 d                  [place] EDC: 12/23/2017 Maternal uterus/adnexae: Subchorionic hemorrhage: Small subchorionic hemorrhage noted. Right ovary: Normal Left ovary: Normal Other :None Free fluid:  None IMPRESSION: 1. Single living intrauterine gestation with an estimated gestational age of [redacted] weeks and 3 days. 2. Small subchorionic hemorrhage. Electronically Signed   By: Signa Kell M.D.   On: 05/23/2017 15:08    MDM UA, UPT CBC, HCG, ABO/Rh Wet prep and gc/chlamydia- patient refused pelvic exam. US OB Transvaginal US OB Comp Less 14 weeks- u/s shows IUP measuring [redacted]w[redacted]d with HR of 180, small subchorionic hemorrhage.  Assessment and Plan   1. Normal intrauterine pregnancy on prenatal ultrasound in first trimester   2. [redacted] weeks gestation of pregnancy   3. Abdominal pain affecting pregnancy   4. Subchorionic hemorrhage of placenta in first trimester, single or unspecified fetus    -Discharge home in  stable condition -Rx for prenatal vitamins given to patient -Abdominal pain and bleeding  precautions discussed -Patient [facility] -Patient may return to MAU as needed or if her condition were to change or worsen   Rolm BookbinderCaroline M Neill CNM 05/23/2017, 3:53 PM

## 2017-06-30 ENCOUNTER — Other Ambulatory Visit: Payer: Self-pay

## 2017-06-30 ENCOUNTER — Encounter: Payer: Self-pay | Admitting: Obstetrics

## 2017-06-30 ENCOUNTER — Ambulatory Visit (INDEPENDENT_AMBULATORY_CARE_PROVIDER_SITE_OTHER): Payer: Medicaid Other | Admitting: Obstetrics

## 2017-06-30 ENCOUNTER — Other Ambulatory Visit (HOSPITAL_COMMUNITY)
Admission: RE | Admit: 2017-06-30 | Discharge: 2017-06-30 | Disposition: A | Discharge status: Home / Self Care | Payer: Medicaid Other | Source: Ambulatory Visit | Attending: Obstetrics | Admitting: Obstetrics

## 2017-06-30 VITALS — BP 130/77 | HR 90 | Temp 99.1°F | Wt 254.2 lb

## 2017-06-30 DIAGNOSIS — O0992 Supervision of high risk pregnancy, unspecified, second trimester: Secondary | ICD-10-CM

## 2017-06-30 DIAGNOSIS — Z3A15 15 weeks gestation of pregnancy: Secondary | ICD-10-CM | POA: Insufficient documentation

## 2017-06-30 DIAGNOSIS — Z3480 Encounter for supervision of other normal pregnancy, unspecified trimester: Secondary | ICD-10-CM | POA: Insufficient documentation

## 2017-06-30 DIAGNOSIS — O10912 Unspecified pre-existing hypertension complicating pregnancy, second trimester: Secondary | ICD-10-CM

## 2017-06-30 DIAGNOSIS — Z6791 Unspecified blood type, Rh negative: Secondary | ICD-10-CM

## 2017-06-30 DIAGNOSIS — G4733 Obstructive sleep apnea (adult) (pediatric): Secondary | ICD-10-CM

## 2017-06-30 DIAGNOSIS — O26899 Other specified pregnancy related conditions, unspecified trimester: Secondary | ICD-10-CM

## 2017-06-30 DIAGNOSIS — Z98891 History of uterine scar from previous surgery: Secondary | ICD-10-CM

## 2017-06-30 DIAGNOSIS — O99212 Obesity complicating pregnancy, second trimester: Secondary | ICD-10-CM

## 2017-06-30 DIAGNOSIS — O10919 Unspecified pre-existing hypertension complicating pregnancy, unspecified trimester: Secondary | ICD-10-CM

## 2017-06-30 DIAGNOSIS — O34219 Maternal care for unspecified type scar from previous cesarean delivery: Secondary | ICD-10-CM

## 2017-06-30 DIAGNOSIS — O99210 Obesity complicating pregnancy, unspecified trimester: Secondary | ICD-10-CM

## 2017-06-30 MED ORDER — VITAFOL GUMMIES 3.33-0.333-34.8 MG PO CHEW: 3.0000 | CHEWABLE_TABLET | Freq: Every day | ORAL | 11 refills | Status: DC

## 2017-06-30 NOTE — Progress Notes (Signed)
Subjective:    Brittany Cordova is being seen today for her first obstetrical visit.  This is a planned pregnancy. She is at 1341w1d gestation. Her obstetrical history is significant for obesity. Relationship with FOB: not living together. Patient does intend to breast feed. Pregnancy history fully reviewed.  The information documented in the HPI was reviewed and verified.  Menstrual History: OB History    Gravida Para Term Preterm AB Living   2 1 1  0 0 1   SAB TAB Ectopic Multiple Live Births   0 0 0 0 1       Patient's last menstrual period was 03/16/2017 (approximate).    Past Medical History:  Diagnosis Date  . Allergic rhinitis, cause unspecified   . 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 Amenorrhea    on BC hormone inj  . Anemia, unspecified   . 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 Asthma    Heat induced  . Dermatitis due to food taken internally   . Hypertension   . Mass    heart    Past Surgical History:  Procedure Laterality Date  . CESAREAN SECTION N/A 05/08/2016   Procedure: CESAREAN SECTION;  Surgeon: Federico FlakeKimberly Niles Newton, MD;  [facility];  Service: Obstetrics;  Laterality: N/A;  . WISDOM TOOTH EXTRACTION       (Not in a hospital admission) Allergies  Allergen Reactions  . Ibuprofen Anaphylaxis and Swelling    Social History   Tobacco Use  . Smoking status: Former Smoker    Packs/day: 0.25    Years: 3.00    Pack years: 0.75    Types: Cigars    Last attempt to quit: 09/18/2015    Years since quitting: 1.7  . Smokeless tobacco: Never Used  Substance Use Topics  . Alcohol use: No    Family History  Problem Relation Age of Onset  . Allergies Father        tomatoes  . Hypertension Father   . Asthma Mother   . Hypertension Mother   . Diabetes Other        grandmother  . Cancer Paternal Grandmother   . Cancer Maternal Uncle        [employer] Uncle  . Cancer Other        great aunts  . Diabetes Maternal Grandmother      Review  of Systems Constitutional: negative for weight loss Gastrointestinal: negative for vomiting Genitourinary:negative for genital lesions and vaginal discharge and dysuria Musculoskeletal:negative for back pain Behavioral/Psych: negative for abusive relationship, depression, illegal drug usage and tobacco use    Objective:    BP 130/77   Pulse 90   Temp 99.1 F (37.3 C)   Wt 254 lb 3.2 oz (115.3 kg)   LMP 03/16/2017 (Approximate)   BMI 45.03 kg/m  General Appearance:    Alert, cooperative, no distress, appears stated age  Head:    Normocephalic, without obvious abnormality, atraumatic  Eyes:    PERRL, conjunctiva/corneas clear, EOM's intact, fundi    benign, both eyes  Ears:    Normal TM's and external ear canals, both ears  Nose:   Nares normal, septum midline, mucosa normal, no drainage    or sinus tenderness  Throat:   Lips, mucosa, and tongue normal; teeth and gums normal  Neck:   Supple, symmetrical, trachea midline, no adenopathy;    thyroid:  no enlargement/tenderness/nodules; no carotid   bruit or JVD  Back:     Symmetric, no  curvature, ROM normal, no CVA tenderness  Lungs:     Clear to auscultation bilaterally, respirations unlabored  Chest Wall:    No tenderness or deformity   Heart:    Regular rate and rhythm, S1 and S2 normal, no murmur, rub   or gallop  Breast Exam:    No tenderness, masses, or nipple abnormality  Abdomen:     Soft, non-tender, bowel sounds active all four quadrants,    no masses, no organomegaly  Genitalia:    Normal female without lesion, discharge or tenderness  Extremities:   Extremities normal, atraumatic, no cyanosis or edema  Pulses:   2+ and symmetric all extremities  Skin:   Skin color, texture, turgor normal, no rashes or lesions  Lymph nodes:   Cervical, supraclavicular, and axillary nodes normal  Neurologic:   CNII-XII intact, normal strength, sensation and reflexes    throughout      Lab Review Urine pregnancy test Labs reviewed  yes Radiologic studies reviewed no  Assessment and Plan:    Pregnancy at [redacted]w[redacted]d weeks   1. Supervision of high risk pregnancy, antepartum, second trimester Rx: - HIV antibody - Hemoglobinopathy evaluation - Varicella zoster antibody, IgG - VITAMIN D 25 Hydroxy (Vit-D Deficiency, Fractures) - Culture, OB Urine - Cervicovaginal ancillary only - Cytology - PAP - Genetic Screening; Future - Obstetric Panel, Including HIV  2. S/P primary low transverse C-section  3. Patient desires vaginal birth after cesarean section (VBAC)  4. Chronic hypertension affecting pregnancy - BP stable  5. Rh negative state in antepartum period - Rhogam at 28 weeks and postpartum  6. Obstructive sleep apnea of adult - may need testing for CPAP   Plan:     Prenatal vitamins.  Counseling provided regarding continued use of seat belts, cessation of alcohol consumption, smoking or use of illicit drugs; infection precautions i.e., influenza/TDAP immunizations, toxoplasmosis,CMV, parvovirus, listeria and varicella; workplace safety, exercise during pregnancy; routine dental care, safe medications, sexual activity, hot tubs, saunas, pools, travel, caffeine use, fish and methlymercury, potential toxins, hair treatments, varicose veins Weight gain recommendations per IOM guidelines reviewed: underweight/BMI< 18.5--> gain 28 - 40 lbs; normal weight/BMI 18.5 - 24.9--> gain 25 - 35 lbs; overweight/BMI 25 - 29.9--> gain 15 - 25 lbs; obese/BMI >30->gain  11 - 20 lbs Problem list reviewed and updated. FIRST/CF mutation testing/NIPT/QUAD SCREEN/fragile X/Ashkenazi Jewish population testing/Spinal muscular atrophy discussed: requested. Role of ultrasound in pregnancy discussed; fetal survey: requested. Amniocentesis discussed: not indicated. VBAC calculator score: VBAC consent form provided No orders of the defined types were placed in this encounter.  No orders of the defined types were placed in this  encounter.   Follow up in 4 weeks. 50% of 20 min visit spent on counseling and coordination of care.    Brock Bad MD

## 2017-06-30 NOTE — Addendum Note (Signed)
Addended by: Natale MilchSTALLING, BRITTANY D on: 06/30/2017 04:02 PM   Modules accepted: Orders

## 2017-07-01 LAB — CERVICOVAGINAL ANCILLARY ONLY
Bacterial vaginitis: NEGATIVE
CHLAMYDIA, DNA PROBE: NEGATIVE
Candida vaginitis: POSITIVE — AB
Neisseria Gonorrhea: NEGATIVE
Trichomonas: POSITIVE — AB

## 2017-07-01 LAB — CYTOLOGY - PAP: DIAGNOSIS: NEGATIVE

## 2017-07-02 ENCOUNTER — Other Ambulatory Visit: Payer: Self-pay | Admitting: Obstetrics

## 2017-07-02 DIAGNOSIS — E559 Vitamin D deficiency, unspecified: Secondary | ICD-10-CM

## 2017-07-02 DIAGNOSIS — A5901 Trichomonal vulvovaginitis: Secondary | ICD-10-CM

## 2017-07-02 DIAGNOSIS — B379 Candidiasis, unspecified: Secondary | ICD-10-CM

## 2017-07-02 LAB — OBSTETRIC PANEL, INCLUDING HIV
Antibody Screen: NEGATIVE
BASOS ABS: 0 10*3/uL (ref 0.0–0.2)
Basos: 0 %
EOS (ABSOLUTE): 0.1 10*3/uL (ref 0.0–0.4)
Eos: 1 %
HIV Screen 4th Generation wRfx: NONREACTIVE
Hematocrit: 34.4 % (ref 34.0–46.6)
Hemoglobin: 11 g/dL — ABNORMAL LOW (ref 11.1–15.9)
Hepatitis B Surface Ag: NEGATIVE
IMMATURE GRANULOCYTES: 0 %
Immature Grans (Abs): 0 10*3/uL (ref 0.0–0.1)
LYMPHS ABS: 2.9 10*3/uL (ref 0.7–3.1)
Lymphs: 32 %
MCH: 24.3 pg — AB (ref 26.6–33.0)
MCHC: 32 g/dL (ref 31.5–35.7)
MCV: 76 fL — ABNORMAL LOW (ref 79–97)
MONOS ABS: 0.6 10*3/uL (ref 0.1–0.9)
Monocytes: 6 %
NEUTROS PCT: 61 %
Neutrophils Absolute: 5.5 10*3/uL (ref 1.4–7.0)
PLATELETS: 272 10*3/uL (ref 150–379)
RBC: 4.53 x10E6/uL (ref 3.77–5.28)
RDW: 17.3 % — ABNORMAL HIGH (ref 12.3–15.4)
RH TYPE: NEGATIVE
RPR Ser Ql: NONREACTIVE
Rubella Antibodies, IGG: <0.9 index — ABNORMAL LOW (ref >0.99)
WBC: 9.1 10*3/uL (ref 3.4–10.8)

## 2017-07-02 LAB — HEMOGLOBINOPATHY EVALUATION
HEMOGLOBIN A2 QUANTITATION: 2.1 % (ref 1.8–3.2)
HGB C: 0 %
HGB S: 0 %
HGB VARIANT: 0 %
Hemoglobin F Quantitation: 0 % (ref 0.0–2.0)
Hgb A: 97.9 % (ref 96.4–98.8)

## 2017-07-02 LAB — VITAMIN D 25 HYDROXY (VIT D DEFICIENCY, FRACTURES): Vit D, 25-Hydroxy: 5.6 ng/mL — ABNORMAL LOW (ref 30.0–100.0)

## 2017-07-02 LAB — VARICELLA ZOSTER ANTIBODY, IGG: VARICELLA: 885 {index} (ref >165)

## 2017-07-02 LAB — URINE CULTURE, OB REFLEX

## 2017-07-02 LAB — CULTURE, OB URINE

## 2017-07-02 MED ORDER — VITAMIN D 50 MCG (2000 UT) PO CAPS
1.0000 | ORAL_CAPSULE | Freq: Every day | ORAL | 11 refills | Status: DC
Start: 2017-07-02 — End: 2019-09-03

## 2017-07-02 MED ORDER — METRONIDAZOLE 500 MG PO TABS: 2000.0000 mg | ORAL_TABLET | Freq: Once | ORAL | 0 refills | Status: AC

## 2017-07-02 MED ORDER — TERCONAZOLE 0.4 % VA CREA: 1.0000 | TOPICAL_CREAM | Freq: Every day | VAGINAL | 0 refills | Status: DC

## 2017-07-14 ENCOUNTER — Other Ambulatory Visit: Payer: Self-pay

## 2017-07-14 ENCOUNTER — Telehealth: Payer: Self-pay

## 2017-07-14 MED ORDER — METRONIDAZOLE 50 MG/ML ORAL SUSPENSION: 15.0000 mg/kg/d | Freq: Three times a day (TID) | ORAL | 0 refills | Status: DC

## 2017-07-14 NOTE — Telephone Encounter (Signed)
Patient notified. She threw up the Flagyl and is requesting it in liquid form.

## 2017-07-14 NOTE — Telephone Encounter (Signed)
-----   Message from Brock Badharles A Harper, MD sent at 07/02/2017  5:31 AM EST ----- Terazol 7 Rx for yeast Flagyl Rx for Trichomonas

## 2017-07-21 ENCOUNTER — Ambulatory Visit (INDEPENDENT_AMBULATORY_CARE_PROVIDER_SITE_OTHER): Payer: Medicaid Other | Admitting: Obstetrics

## 2017-07-21 ENCOUNTER — Encounter: Payer: Self-pay | Admitting: Obstetrics

## 2017-07-21 VITALS — BP 134/83 | HR 85 | Wt 253.7 lb

## 2017-07-21 DIAGNOSIS — O99210 Obesity complicating pregnancy, unspecified trimester: Secondary | ICD-10-CM

## 2017-07-21 DIAGNOSIS — O0992 Supervision of high risk pregnancy, unspecified, second trimester: Secondary | ICD-10-CM

## 2017-07-21 DIAGNOSIS — E669 Obesity, unspecified: Secondary | ICD-10-CM

## 2017-07-21 DIAGNOSIS — O10912 Unspecified pre-existing hypertension complicating pregnancy, second trimester: Secondary | ICD-10-CM

## 2017-07-21 DIAGNOSIS — Z98891 History of uterine scar from previous surgery: Secondary | ICD-10-CM

## 2017-07-21 DIAGNOSIS — O10919 Unspecified pre-existing hypertension complicating pregnancy, unspecified trimester: Secondary | ICD-10-CM

## 2017-07-21 DIAGNOSIS — O26899 Other specified pregnancy related conditions, unspecified trimester: Secondary | ICD-10-CM

## 2017-07-21 DIAGNOSIS — O99212 Obesity complicating pregnancy, second trimester: Secondary | ICD-10-CM

## 2017-07-21 DIAGNOSIS — Z6791 Unspecified blood type, Rh negative: Secondary | ICD-10-CM

## 2017-07-21 DIAGNOSIS — O34219 Maternal care for unspecified type scar from previous cesarean delivery: Secondary | ICD-10-CM

## 2017-07-21 DIAGNOSIS — O0990 Supervision of high risk pregnancy, unspecified, unspecified trimester: Secondary | ICD-10-CM

## 2017-07-21 NOTE — Progress Notes (Signed)
Subjective:  Brittany Cordova is a 27 y.o. G2P1001 at 407w1d being seen today for ongoing prenatal care.  She is currently monitored for the following issues for this high-risk pregnancy and has ANEMIA; ALLERGIC RHINITIS; ANGIOEDEMA; Pruritus; Obstructive sleep apnea; Supervision of high risk pregnancy, antepartum; Rh negative, antepartum; Chronic hypertension affecting pregnancy; Rubella non-immune status, antepartum; GBS (group B Streptococcus carrier), +RV culture, currently pregnant; Trichomoniasis; Chronic hypertension in obstetric context, third trimester; S/P primary low transverse C-section; LGSIL on Pap smear of cervix; and Supervision of other normal pregnancy, antepartum on their problem list.  Patient reports no complaints.  Contractions: Not present. Vag. Bleeding: None.  Movement: Absent. Denies leaking of fluid.   The following portions of the patient's history were reviewed and updated as appropriate: allergies, current medications, past family history, past medical history, past social history, past surgical history and problem list. Problem list updated.  Objective:   Vitals:   07/21/17 1408  BP: 134/83  Pulse: 85  Weight: 253 lb 11.2 oz (115.1 kg)    Fetal Status:     Movement: Absent     General:  Alert, oriented and cooperative. Patient is in no acute distress.  Skin: Skin is warm and dry. No rash noted.   Cardiovascular: Normal heart rate noted  Respiratory: Normal respiratory effort, no problems with respiration noted  Abdomen: Soft, gravid, appropriate for gestational age. Pain/Pressure: Present     Pelvic:  Cervical exam deferred        Extremities: Normal range of motion.  Edema: None  Mental Status: Normal mood and affect. Normal behavior. Normal judgment and thought content.   Urinalysis:      Assessment and Plan:  Pregnancy: G2P1001 at 417w1d  1. Supervision of high risk pregnancy, antepartum Rx - Genetic Screening  2. S/P primary low transverse  C-section  3. Patient desires vaginal birth after cesarean section (VBAC)  4. Chronic hypertension affecting pregnancy - stable BP's  5. Rh negative state in antepartum period - Rhogam at 28 weeks and postpartum  6. Obesity affecting pregnancy, antepartum   Preterm labor symptoms and general obstetric precautions including but not limited to vaginal bleeding, contractions, leaking of fluid and fetal movement were reviewed in detail with the patient. Please refer to After Visit Summary for other counseling recommendations.  Return in about 4 weeks (around 08/18/2017) for ROB.   Brock BadHarper, Albertine Lafoy A, MD

## 2017-07-22 ENCOUNTER — Other Ambulatory Visit: Payer: Self-pay | Admitting: Obstetrics

## 2017-07-22 ENCOUNTER — Ambulatory Visit (HOSPITAL_COMMUNITY)
Admission: RE | Admit: 2017-07-22 | Discharge: 2017-07-22 | Disposition: A | Discharge status: Home / Self Care | Payer: Medicaid Other | Source: Ambulatory Visit | Attending: Obstetrics | Admitting: Obstetrics

## 2017-07-22 DIAGNOSIS — Z3689 Encounter for other specified antenatal screening: Secondary | ICD-10-CM

## 2017-07-22 DIAGNOSIS — O34219 Maternal care for unspecified type scar from previous cesarean delivery: Secondary | ICD-10-CM

## 2017-07-22 DIAGNOSIS — O99212 Obesity complicating pregnancy, second trimester: Secondary | ICD-10-CM | POA: Diagnosis present

## 2017-07-22 DIAGNOSIS — Z3A18 18 weeks gestation of pregnancy: Secondary | ICD-10-CM

## 2017-07-22 DIAGNOSIS — O10912 Unspecified pre-existing hypertension complicating pregnancy, second trimester: Secondary | ICD-10-CM | POA: Diagnosis not present

## 2017-07-22 DIAGNOSIS — O0992 Supervision of high risk pregnancy, unspecified, second trimester: Secondary | ICD-10-CM

## 2017-07-22 DIAGNOSIS — O10919 Unspecified pre-existing hypertension complicating pregnancy, unspecified trimester: Secondary | ICD-10-CM | POA: Diagnosis present

## 2017-07-28 ENCOUNTER — Other Ambulatory Visit (HOSPITAL_COMMUNITY): Payer: Medicaid Other

## 2017-08-18 ENCOUNTER — Ambulatory Visit (INDEPENDENT_AMBULATORY_CARE_PROVIDER_SITE_OTHER): Payer: Medicaid Other | Admitting: Obstetrics

## 2017-08-18 ENCOUNTER — Other Ambulatory Visit: Payer: Self-pay

## 2017-08-18 ENCOUNTER — Encounter: Payer: Self-pay | Admitting: Obstetrics

## 2017-08-18 VITALS — BP 128/80 | HR 82 | Wt 255.0 lb

## 2017-08-18 DIAGNOSIS — O34219 Maternal care for unspecified type scar from previous cesarean delivery: Secondary | ICD-10-CM

## 2017-08-18 DIAGNOSIS — O99210 Obesity complicating pregnancy, unspecified trimester: Secondary | ICD-10-CM

## 2017-08-18 DIAGNOSIS — Z3482 Encounter for supervision of other normal pregnancy, second trimester: Secondary | ICD-10-CM

## 2017-08-18 DIAGNOSIS — Z98891 History of uterine scar from previous surgery: Secondary | ICD-10-CM

## 2017-08-18 DIAGNOSIS — Z3480 Encounter for supervision of other normal pregnancy, unspecified trimester: Secondary | ICD-10-CM

## 2017-08-18 DIAGNOSIS — O99212 Obesity complicating pregnancy, second trimester: Secondary | ICD-10-CM

## 2017-08-18 NOTE — Progress Notes (Signed)
Subjective:  Brittany Cordova is a 27 y.o. G2P1001 at 5556w1d being seen today for ongoing prenatal care.  She is currently monitored for the following issues for this high-risk pregnancy and has ANEMIA; ALLERGIC RHINITIS; ANGIOEDEMA; Pruritus; Obstructive sleep apnea; Supervision of high risk pregnancy, antepartum; Rh negative, antepartum; Chronic hypertension affecting pregnancy; Rubella non-immune status, antepartum; GBS (group B Streptococcus carrier), +RV culture, currently pregnant; Trichomoniasis; Chronic hypertension in obstetric context, third trimester; S/P primary low transverse C-section; LGSIL on Pap smear of cervix; and Supervision of other normal pregnancy, antepartum on their problem list.  Patient reports no complaints.  Contractions: Irritability. Vag. Bleeding: None.  Movement: Present. Denies leaking of fluid.   The following portions of the patient's history were reviewed and updated as appropriate: allergies, current medications, past family history, past medical history, past social history, past surgical history and problem list. Problem list updated.  Objective:   Vitals:   08/18/17 1553  BP: 128/80  Pulse: 82  Weight: 255 lb (115.7 kg)    Fetal Status: Fetal Heart Rate (bpm): 150   Movement: Present     General:  Alert, oriented and cooperative. Patient is in no acute distress.  Skin: Skin is warm and dry. No rash noted.   Cardiovascular: Normal heart rate noted  Respiratory: Normal respiratory effort, no problems with respiration noted  Abdomen: Soft, gravid, appropriate for gestational age. Pain/Pressure: Absent     Pelvic:  Cervical exam deferred        Extremities: Normal range of motion.  Edema: None  Mental Status: Normal mood and affect. Normal behavior. Normal judgment and thought content.   Urinalysis:      Assessment and Plan:  Pregnancy: G2P1001 at 4656w1d  1. Supervision of other normal pregnancy, antepartum   2. S/P primary low transverse  C-section   3. Patient desires vaginal birth after cesarean section (VBAC)   4. Obesity affecting pregnancy, antepartum   Preterm labor symptoms and general obstetric precautions including but not limited to vaginal bleeding, contractions, leaking of fluid and fetal movement were reviewed in detail with the patient. Please refer to After Visit Summary for other counseling recommendations.  Return in about 1 month (around 09/15/2017) for ROB.   Brock BadHarper, Ahlia Lemanski A, MD

## 2017-08-31 ENCOUNTER — Other Ambulatory Visit: Payer: Self-pay

## 2017-08-31 ENCOUNTER — Ambulatory Visit (HOSPITAL_COMMUNITY)
Admission: EM | Admit: 2017-08-31 | Discharge: 2017-08-31 | Disposition: A | Discharge status: Home / Self Care | Payer: Medicaid Other

## 2017-08-31 ENCOUNTER — Inpatient Hospital Stay (HOSPITAL_COMMUNITY)
Admission: AD | Admit: 2017-08-31 | Discharge: 2017-08-31 | Disposition: A | Discharge status: Home / Self Care | Payer: Medicaid Other | Source: Ambulatory Visit | Attending: Obstetrics & Gynecology | Admitting: Obstetrics & Gynecology

## 2017-08-31 ENCOUNTER — Encounter (HOSPITAL_COMMUNITY): Payer: Self-pay | Admitting: *Deleted

## 2017-08-31 DIAGNOSIS — O162 Unspecified maternal hypertension, second trimester: Secondary | ICD-10-CM | POA: Diagnosis not present

## 2017-08-31 DIAGNOSIS — Z886 Allergy status to analgesic agent status: Secondary | ICD-10-CM | POA: Insufficient documentation

## 2017-08-31 DIAGNOSIS — Z91018 Allergy to other foods: Secondary | ICD-10-CM | POA: Insufficient documentation

## 2017-08-31 DIAGNOSIS — I10 Essential (primary) hypertension: Secondary | ICD-10-CM

## 2017-08-31 DIAGNOSIS — O99712 Diseases of the skin and subcutaneous tissue complicating pregnancy, second trimester: Secondary | ICD-10-CM | POA: Insufficient documentation

## 2017-08-31 DIAGNOSIS — Z9889 Other specified postprocedural states: Secondary | ICD-10-CM | POA: Insufficient documentation

## 2017-08-31 DIAGNOSIS — R229 Localized swelling, mass and lump, unspecified: Secondary | ICD-10-CM

## 2017-08-31 DIAGNOSIS — R222 Localized swelling, mass and lump, trunk: Secondary | ICD-10-CM | POA: Diagnosis present

## 2017-08-31 DIAGNOSIS — L02212 Cutaneous abscess of back [any part, except buttock]: Secondary | ICD-10-CM | POA: Diagnosis not present

## 2017-08-31 DIAGNOSIS — Z3A24 24 weeks gestation of pregnancy: Secondary | ICD-10-CM | POA: Insufficient documentation

## 2017-08-31 DIAGNOSIS — Z87891 Personal history of nicotine dependence: Secondary | ICD-10-CM | POA: Insufficient documentation

## 2017-08-31 DIAGNOSIS — O10912 Unspecified pre-existing hypertension complicating pregnancy, second trimester: Secondary | ICD-10-CM

## 2017-08-31 DIAGNOSIS — O9989 Other specified diseases and conditions complicating pregnancy, childbirth and the puerperium: Secondary | ICD-10-CM | POA: Diagnosis not present

## 2017-08-31 HISTORY — DX: Unspecified ovarian cyst, unspecified side: N83.209

## 2017-08-31 LAB — COMPREHENSIVE METABOLIC PANEL
ALT: 8 U/L — AB (ref 14–54)
AST: 15 U/L (ref 15–41)
Albumin: 3 g/dL — ABNORMAL LOW (ref 3.5–5.0)
Alkaline Phosphatase: 77 U/L (ref 38–126)
Anion gap: 7 (ref 5–15)
BUN: 8 mg/dL (ref 6–20)
CHLORIDE: 109 mmol/L (ref 101–111)
CO2: 20 mmol/L — AB (ref 22–32)
CREATININE: 0.52 mg/dL (ref 0.44–1.00)
Calcium: 8.8 mg/dL — ABNORMAL LOW (ref 8.9–10.3)
Glucose, Bld: 111 mg/dL — ABNORMAL HIGH (ref 65–99)
Potassium: 3.7 mmol/L (ref 3.5–5.1)
SODIUM: 136 mmol/L (ref 135–145)
Total Bilirubin: 0.3 mg/dL (ref 0.3–1.2)
Total Protein: 6.7 g/dL (ref 6.5–8.1)

## 2017-08-31 LAB — CBC
HCT: 32.5 % — ABNORMAL LOW (ref 36.0–46.0)
Hemoglobin: 10.6 g/dL — ABNORMAL LOW (ref 12.0–15.0)
MCH: 25.2 pg — AB (ref 26.0–34.0)
MCHC: 32.6 g/dL (ref 30.0–36.0)
MCV: 77.4 fL — AB (ref 78.0–100.0)
PLATELETS: 213 10*3/uL (ref 150–400)
RBC: 4.2 MIL/uL (ref 3.87–5.11)
RDW: 15.3 % (ref 11.5–15.5)
WBC: 9.3 10*3/uL (ref 4.0–10.5)

## 2017-08-31 LAB — PROTEIN / CREATININE RATIO, URINE
CREATININE, URINE: 292 mg/dL
Protein Creatinine Ratio: 0.05 mg/mg{Cre} (ref 0.00–0.15)
Total Protein, Urine: 16 mg/dL

## 2017-08-31 MED ORDER — DICLOXACILLIN SODIUM 500 MG PO CAPS: 500.0000 mg | ORAL_CAPSULE | Freq: Four times a day (QID) | ORAL | 0 refills | Status: DC

## 2017-08-31 NOTE — MAU Note (Signed)
Has a lump on her back, been there for about 3-4 days.  Getting larger and more painful. [facility]- they looked at it and said it wasn't inflamed so they would not touch it or do anything, though might need an [place]

## 2017-08-31 NOTE — MAU Provider Note (Signed)
History     CSN: [id number]  Arrival date and time: 08/31/17 1246   First Provider Initiated Contact with Patient 08/31/17 1337      Chief Complaint  Patient presents with  . lump on back   HPI Brittany Cordova is a 27 y.o. G2P1001 at 4017w0d who presents from urgent care for back mass & hypertension. Patient reports noticing a lump on her mid left back 3 days ago. Lump has progressively gotten bigger and more painful. Rates pain 4/10 that is constant & 10/10 when the lump is touched. No drainage. Has never had a lump like this before. Denies fever/chills.  Patient with history of chronic hypertension. Not on meds. Denies headache visual disturbance or epigastric pain. BP at urgent care was elevated.  Denies abdominal pain. Positive fetal movement.   OB History    Gravida  2   Para  1   Term  1   Preterm  0   AB  0   Living  1     SAB  0   TAB  0   Ectopic  0   Multiple  0   Live Births  1           Past Medical History:  Diagnosis Date  . Allergic rhinitis, cause unspecified   . 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 Amenorrhea    on BC hormone inj  . Anemia, unspecified   . 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 Asthma    Heat induced  . Dermatitis due to food taken internally   . Hypertension   . Mass    heart  . Ovarian cyst     Past Surgical History:  Procedure Laterality Date  . CESAREAN SECTION N/A 05/08/2016   Procedure: CESAREAN SECTION;  Surgeon: Federico FlakeKimberly Niles Newton, MD;  [facility];  Service: Obstetrics;  Laterality: N/A;  . WISDOM TOOTH EXTRACTION      Family History  Problem Relation Age of Onset  . Allergies Father        tomatoes  . Hypertension Father   . Asthma Mother   . Hypertension Mother   . Diabetes Mother   . Diabetes Other        grandmother  . Cancer Paternal Grandmother   . Diabetes Paternal Grandmother   . Cancer Maternal Uncle        [employer] Uncle  . Cancer Other        great aunts  .  Diabetes Maternal Grandmother   . Heart disease Maternal Grandfather     Social History   Tobacco Use  . Smoking status: Former Smoker    Packs/day: 0.25    Years: 3.00    Pack years: 0.75    Types: Cigars    Last attempt to quit: 09/18/2015    Years since quitting: 1.9  . Smokeless tobacco: Never Used  Substance Use Topics  . Alcohol use: No  . Drug use: Not Currently    Types: Marijuana    Comment: not since found out preg    Allergies:  Allergies  Allergen Reactions  . Ibuprofen Anaphylaxis and Swelling    Medications Prior to Admission  Medication Sig Dispense Refill Last Dose  . Cholecalciferol (VITAMIN D) 2000 units CAPS Take 1 capsule (2,000 Units total) by mouth daily before breakfast. 30 capsule 11 Taking  . Prenatal Vit-Fe Phos-FA-Omega (VITAFOL GUMMIES) 3.33-0.333-34.8 MG CHEW Chew 3 tablets by mouth daily before breakfast. 90 tablet 11 Taking  Review of Systems  Constitutional: Negative.   Eyes: Negative for visual disturbance.  Gastrointestinal: Negative.   Musculoskeletal: Positive for back pain.  Skin:       Mass on back  Neurological: Negative for headaches.   Physical Exam   Blood pressure 122/72, pulse (!) 101, temperature 98.7 F (37.1 C), temperature source Oral, resp. rate 18, weight 252 lb 12 oz (114.6 kg), last menstrual period 03/16/2017, SpO2 100 %, currently breastfeeding.  Physical Exam  Nursing note and vitals reviewed. Constitutional: She is oriented to person, place, and time. She appears well-developed and well-nourished. No distress.  HENT:  Head: Normocephalic and atraumatic.  Eyes: Conjunctivae are normal. Right eye exhibits no discharge. Left eye exhibits no discharge. No scleral icterus.  Neck: Normal range of motion.  Respiratory: Effort normal. No respiratory distress.  GI: Soft. There is no tenderness.  Neurological: She is alert and oriented to person, place, and time.  Skin: Skin is warm and dry. She is not  diaphoretic.     Psychiatric: She has a normal mood and affect. Her behavior is normal. Judgment and thought content normal.    MAU Course  Procedures Results for orders placed or performed during the hospital encounter of 08/31/17 (from the past 24 hour(s))  CBC     Status: Abnormal   Collection Time: 08/31/17  2:30 PM  Result Value Ref Range   WBC 9.3 4.0 - 10.5 K/uL   RBC 4.20 3.87 - 5.11 MIL/uL   Hemoglobin 10.6 (L) 12.0 - 15.0 g/dL   HCT 44.0 (L) 10.2 - 72.5 %   MCV 77.4 (L) 78.0 - 100.0 fL   MCH 25.2 (L) 26.0 - 34.0 pg   MCHC 32.6 30.0 - 36.0 g/dL   RDW 36.6 44.0 - 34.7 %   Platelets 213 150 - 400 K/uL  Comprehensive metabolic panel     Status: Abnormal   Collection Time: 08/31/17  2:30 PM  Result Value Ref Range   Sodium 136 135 - 145 mmol/L   Potassium 3.7 3.5 - 5.1 mmol/L   Chloride 109 101 - 111 mmol/L   CO2 20 (L) 22 - 32 mmol/L   Glucose, Bld 111 (H) 65 - 99 mg/dL   BUN 8 6 - 20 mg/dL   Creatinine, Ser 4.25 0.44 - 1.00 mg/dL   Calcium 8.8 (L) 8.9 - 10.3 mg/dL   Total Protein 6.7 6.5 - 8.1 g/dL   Albumin 3.0 (L) 3.5 - 5.0 g/dL   AST 15 15 - 41 U/L   ALT 8 (L) 14 - 54 U/L   Alkaline Phosphatase 77 38 - 126 U/L   Total Bilirubin 0.3 0.3 - 1.2 mg/dL   GFR calc non Af Amer >60 >60 mL/min   GFR calc Af Amer >60 >60 mL/min   Anion gap 7 5 - 15  Protein / creatinine ratio, urine     Status: None   Collection Time: 08/31/17  2:37 PM  Result Value Ref Range   Creatinine, Urine 292.00 mg/dL   Total Protein, Urine 16 mg/dL   Protein Creatinine Ratio 0.05 0.00 - 0.15 mg/mg[Cre]    MDM FHT 150 Afebrile  Normotensive in MAU and asymptomatic -- will collect baseline labs d/t CHTN as they have not been collected during this pregnancy  Drs. Degele & Harraway-Smith in to evaluate patient. Abscess vs sebaceous cyst. Will d/c home with abx with f/u in 1 week.  Borders marked  Assessment and Plan  A: 1. Abscess of lower back  2. [redacted] weeks gestation of pregnancy   3.  Maternal chronic hypertension in second trimester    P; Discharge home msg to [facility] for f/u in a week -- may need gen surg consult for I&D If symptoms worsen, pt instructed to go to [facility] or [facility] Rx dicloxacillin   Judeth Horn 08/31/2017, 1:37 PM

## 2017-08-31 NOTE — Discharge Instructions (Signed)
Hypertension During Pregnancy Hypertension, commonly called high blood pressure, is when the force of blood pumping through your arteries is too strong. Arteries are blood vessels that carry blood from the heart throughout the body. Hypertension during pregnancy can cause problems for you and your baby. Your baby may be born early (prematurely) or may not weigh as much as he or she should at birth. Very bad cases of hypertension during pregnancy can be life-threatening. Different types of hypertension can occur during pregnancy. These include:  Chronic hypertension. This happens when: ? You have hypertension before pregnancy and it continues during pregnancy. ? You develop hypertension before you are [redacted] weeks pregnant, and it continues during pregnancy.  Gestational hypertension. This is hypertension that develops after the 20th week of pregnancy.  Preeclampsia, also called toxemia of pregnancy. This is a very serious type of hypertension that develops only during pregnancy. It affects the whole body, and it can be very dangerous for you and your baby.  Gestational hypertension and preeclampsia usually go away within 6 weeks after your baby is born. Women who have hypertension during pregnancy have a greater chance of developing hypertension later in life or during future pregnancies. What are the causes? The exact cause of hypertension is not known. What increases the risk? There are certain factors that make it more likely for you to develop hypertension during pregnancy. These include:  Having hypertension during a previous pregnancy or prior to pregnancy.  Being overweight.  Being older than age 28.  Being pregnant for the first time or being pregnant with more than one baby.  Becoming pregnant using fertilization methods such as IVF (in vitro fertilization).  Having diabetes, kidney problems, or systemic lupus erythematosus.  Having a family history of hypertension.  What are  the signs or symptoms? Chronic hypertension and gestational hypertension rarely cause symptoms. Preeclampsia causes symptoms, which may include:  Increased protein in your urine. Your health care provider will check for this at every visit before you give birth (prenatal visit).  Severe headaches.  Sudden weight gain.  Swelling of the hands, face, legs, and feet.  Nausea and vomiting.  Vision problems, such as blurred or double vision.  Numbness in the face, arms, legs, and feet.  Dizziness.  Slurred speech.  Sensitivity to bright lights.  Abdominal pain.  Convulsions.  How is this diagnosed? You may be diagnosed with hypertension during a routine prenatal exam. At each prenatal visit, you may:  Have a urine test to check for high amounts of protein in your urine.  Have your blood pressure checked. A blood pressure reading is recorded as two numbers, such as "120 over 80" (or 120/80). The first ("top") number is called the systolic pressure. It is a measure of the pressure in your arteries when your heart beats. The second ("bottom") number is called the diastolic pressure. It is a measure of the pressure in your arteries as your heart relaxes between beats. Blood pressure is measured in a unit called mm Hg. A normal blood pressure reading is: ? Systolic: below 120. ? Diastolic: below 80.  The type of hypertension that you are diagnosed with depends on your test results and when your symptoms developed.  Chronic hypertension is usually diagnosed before 20 weeks of pregnancy.  Gestational hypertension is usually diagnosed after 20 weeks of pregnancy.  Hypertension with high amounts of protein in the urine is diagnosed as preeclampsia.  Blood pressure measurements that stay above 160 systolic, or above 110 diastolic,  are signs of severe preeclampsia.  How is this treated? Treatment for hypertension during pregnancy varies depending on the type of hypertension you have and  how serious it is.  If you take medicines called ACE inhibitors to treat chronic hypertension, you may need to switch medicines. ACE inhibitors should not be taken during pregnancy.  If you have gestational hypertension, you may need to take blood pressure medicine.  If you are at risk for preeclampsia, your health care provider may recommend that you take a low-dose aspirin every day to prevent high blood pressure during your pregnancy.  If you have severe preeclampsia, you may need to be hospitalized so you and your baby can be monitored closely. You may also need to take medicine (magnesium sulfate) to prevent seizures and to lower blood pressure. This medicine may be given as an injection or through an IV tube.  In some cases, if your condition gets worse, you may need to deliver your baby early.  Follow these instructions at home: Eating and drinking  Drink enough fluid to keep your urine clear or pale yellow.  Eat a healthy diet that is low in salt (sodium). Do not add salt to your food. Check food labels to see how much sodium a food or beverage contains. Lifestyle  Do not use any products that contain nicotine or tobacco, such as cigarettes and e-cigarettes. If you need help quitting, ask your health care provider.  Do not use alcohol.  Avoid caffeine.  Avoid stress as much as possible. Rest and get plenty of sleep. General instructions  Take over-the-counter and prescription medicines only as told by your health care provider.  While lying down, lie on your left side. This keeps pressure off your baby.  While sitting or lying down, raise (elevate) your feet. Try putting some pillows under your lower legs.  Exercise regularly. Ask your health care provider what kinds of exercise are best for you.  Keep all prenatal and follow-up visits as told by your health care provider. This is important. Contact a health care provider if:  You have symptoms that your health care  provider told you may require more treatment or monitoring, such as: ? Fever. ? Vomiting. ? Headache. Get help right away if:  You have severe abdominal pain or vomiting that does not get better with treatment.  You suddenly develop swelling in your hands, ankles, or face.  You gain 4 lbs (1.8 kg) or more in 1 week.  You develop vaginal bleeding, or you have blood in your urine.  You do not feel your baby moving as much as usual.  You have blurred or double vision.  You have muscle twitching or sudden tightening (spasms).  You have shortness of breath.  Your lips or fingernails turn blue. This information is not intended to replace advice given to you by your health care provider. Make sure you discuss any questions you have with your health care provider. Document Released: 01/22/2011 Document Revised: 11/24/2015 Document Reviewed: 10/20/2015 Elsevier Interactive Patient Education  2018 [employer] Skin Abscess A skin abscess is an infected area on or under your skin that contains pus and other material. An abscess can happen almost anywhere on your body. Some abscesses break open (rupture) on their own. Most continue to get worse unless they are treated. The infection can spread deeper into the body and into your blood, which can make you feel sick. Treatment usually involves draining the abscess. Follow these instructions at home: Abscess  Care  If you have an abscess that has not drained, place a warm, clean, wet washcloth over the abscess several times a day. Do this as told by your doctor.  Follow instructions from your doctor about how to take care of your abscess. Make sure you: ? Cover the abscess with a bandage (dressing). ? Change your bandage or gauze as told by your doctor. ? Wash your hands with soap and water before you change the bandage or gauze. If you cannot use soap and water, use hand sanitizer.  Check your abscess every day for signs that the infection is  getting worse. Check for: ? More redness, swelling, or pain. ? More fluid or blood. ? Warmth. ? More pus or a bad smell. Medicines   Take over-the-counter and prescription medicines only as told by your doctor.  If you were prescribed an antibiotic medicine, take it as told by your doctor. Do not stop taking the antibiotic even if you start to feel better. General instructions  To avoid spreading the infection: ? Do not share personal care items, towels, or hot tubs with others. ? Avoid making skin-to-skin contact with other people.  Keep all follow-up visits as told by your doctor. This is important. Contact a doctor if:  You have more redness, swelling, or pain around your abscess.  You have more fluid or blood coming from your abscess.  Your abscess feels warm when you touch it.  You have more pus or a bad smell coming from your abscess.  You have a fever.  Your muscles ache.  You have chills.  You feel sick. Get help right away if:  You have very bad (severe) pain.  You see red streaks on your skin spreading away from the abscess. This information is not intended to replace advice given to you by your health care provider. Make sure you discuss any questions you have with your health care provider. Document Released: 10/23/2007 Document Revised: 12/31/2015 Document Reviewed: 03/15/2015 Elsevier Interactive Patient Education  [employer].

## 2017-08-31 NOTE — Discharge Instructions (Signed)
Please present to [facility] today.

## 2017-08-31 NOTE — ED Provider Notes (Signed)
08/31/2017 12:38 PM   DOB: 09/02/1990 / MRN: [id number]  SUBJECTIVE:  Brittany Cordova is a 27 y.o. female presenting for painful lump on the left side of her spine.  Started about 1 week ago and is worsening.  She has never had this problem before.   She is allergic to ibuprofen.   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    She  reports that she quit smoking about 1 years ago. Her smoking use included cigars. She has a 0.75 pack-year smoking history. She has never used smokeless tobacco. She reports that she has current or past drug history. Drug: Marijuana. She reports that she does not drink alcohol. She  reports that she does not currently engage in sexual activity. She reports using the following method of birth control/protection: None. The patient  has a past surgical history that includes Wisdom tooth extraction and Cesarean section (N/A, 05/08/2016).  Her family history includes Allergies in her father; Asthma in her mother; Cancer in her maternal uncle, other, and paternal grandmother; Diabetes in her maternal grandmother and other; Hypertension in her father and mother.  Review of Systems  Constitutional: Negative for chills, diaphoresis and fever.  Respiratory: Negative for shortness of breath.   Cardiovascular: Negative for chest pain, orthopnea and leg swelling.  Gastrointestinal: Negative for nausea.  Genitourinary: Negative for dysuria, frequency, hematuria and urgency.  Skin: Positive for rash. Negative for itching.  Neurological: Negative for dizziness and headaches.    OBJECTIVE:  BP (!) 159/96 (BP Location: Left Arm) Comment: Provider in room  Pulse 93   Temp (!) 97 F (36.1 C) (Oral)   Resp 20   LMP 03/16/2017 (Approximate)   SpO2 97%   Physical Exam    Constitutional: She is oriented to person, place, and time. She appears well-developed.  Eyes: Pupils are equal, round, and reactive to light. EOM are normal.  Cardiovascular: Normal rate.  Pulmonary/Chest: Effort normal.  Abdominal: She exhibits no distension.  Musculoskeletal: Normal range of motion.  Neurological: She is alert and oriented to person, place, and time. No cranial nerve deficit.  Skin: Skin is warm and dry. She is not diaphoretic.     Psychiatric: She has a normal mood and affect.  Vitals reviewed.   No results found for this or any previous visit (from the past 72 hour(s)).  No results found.  ASSESSMENT AND PLAN:  No orders of the defined types were placed in this encounter.    Skin mass - I think she needs further evaluation.  She is 5 months pregnant with uncontrolled hypertension and a mass to the left of her spine that is exquisitely tender however without rash, erythema, punctum.  Of advised that she present to women[facility] for further management of this as well as her elevated blood pressure.  Uncontrolled hypertension      The patient is advised to call or return to clinic if she does not see an improvement in symptoms, or to seek the care of the closest emergency department if she worsens with the above plan.   Deliah BostonMichael Clark, MHS, PA-C 08/31/2017 12:38 PM    Ofilia Neaslark, Michael L, PA-C 08/31/17 1238

## 2017-08-31 NOTE — ED Triage Notes (Signed)
Eval per PA

## 2017-09-15 ENCOUNTER — Ambulatory Visit (INDEPENDENT_AMBULATORY_CARE_PROVIDER_SITE_OTHER): Payer: Medicaid Other | Admitting: Obstetrics and Gynecology

## 2017-09-15 ENCOUNTER — Encounter: Payer: Self-pay | Admitting: Obstetrics and Gynecology

## 2017-09-15 ENCOUNTER — Other Ambulatory Visit: Payer: Self-pay

## 2017-09-15 ENCOUNTER — Encounter: Payer: Medicaid Other | Admitting: Obstetrics

## 2017-09-15 VITALS — BP 134/86 | HR 74 | Wt 252.4 lb

## 2017-09-15 DIAGNOSIS — O26899 Other specified pregnancy related conditions, unspecified trimester: Secondary | ICD-10-CM

## 2017-09-15 DIAGNOSIS — Z6791 Unspecified blood type, Rh negative: Secondary | ICD-10-CM

## 2017-09-15 DIAGNOSIS — O10919 Unspecified pre-existing hypertension complicating pregnancy, unspecified trimester: Secondary | ICD-10-CM

## 2017-09-15 DIAGNOSIS — Z98891 History of uterine scar from previous surgery: Secondary | ICD-10-CM

## 2017-09-15 DIAGNOSIS — Z3480 Encounter for supervision of other normal pregnancy, unspecified trimester: Secondary | ICD-10-CM

## 2017-09-15 MED ORDER — CEPHALEXIN 500 MG PO CAPS: 500.0000 mg | ORAL_CAPSULE | Freq: Four times a day (QID) | ORAL | 0 refills | Status: DC

## 2017-09-15 MED ORDER — OXYCODONE-ACETAMINOPHEN 5-325 MG PO TABS: 1.0000 | ORAL_TABLET | Freq: Four times a day (QID) | ORAL | 0 refills | Status: DC | PRN

## 2017-09-15 NOTE — Progress Notes (Signed)
C/o boil on her, pus, blood, pain 8/10 and odor x 4 days.

## 2017-09-15 NOTE — Progress Notes (Signed)
   PRENATAL VISIT NOTE  Subjective:  Brittany Cordova is a 27 y.o. G2P1001 at [redacted]w[redacted]d being seen today for ongoing prenatal care.  She is currently monitored for the following issues for this high-risk pregnancy and has ANEMIA; ALLERGIC RHINITIS; ANGIOEDEMA; Pruritus; Obstructive sleep apnea; Rh negative, antepartum; Chronic hypertension affecting pregnancy; Rubella non-immune status, antepartum; Trichomoniasis; Chronic hypertension in obstetric context, third trimester; S/P primary low transverse C-section; LGSIL on Pap smear of cervix; and Supervision of other normal pregnancy, antepartum on their problem list.  Patient reports presence of a boil on back which started draining yesterday.  Contractions: Not present. Vag. Bleeding: None.  Movement: Present. Denies leaking of fluid.   The following portions of the patient's history were reviewed and updated as appropriate: allergies, current medications, past family history, past medical history, past social history, past surgical history and problem list. Problem list updated.  Objective:   Vitals:   09/15/17 1503  BP: 134/86  Pulse: 74  Weight: 252 lb 6.4 oz (114.5 kg)    Fetal Status: Fetal Heart Rate (bpm): 143 Fundal Height: 26 cm Movement: Present     General:  Alert, oriented and cooperative. Patient is in no acute distress.  Skin: Skin is warm and dry. No rash noted.   Cardiovascular: Normal heart rate noted  Respiratory: Normal respiratory effort, no problems with respiration noted  Abdomen: Soft, gravid, appropriate for gestational age.  Pain/Pressure: Absent     Pelvic: Cervical exam deferred        Extremities: Normal range of motion.  Edema: Trace  Mental Status: Normal mood and affect. Normal behavior. Normal judgment and thought content.   Assessment and Plan:  Pregnancy: G2P1001 at [redacted]w[redacted]d  1. Supervision of other normal pregnancy, antepartum Patient is doing well Approximately 2.5 cm boil in mid back draining a  small amount of purulent discharge. Patient's mother states that it looks significantly better. Area cleaned. Advised to apply warm compress on the area. Rx Keflex provided Rx percocet provided for pain Third trimester labs, glucola and tdap next visit - [place] MFM OB FOLLOW UP; Future  2. Rh negative, antepartum Rhogam next visit  3. Chronic hypertension affecting pregnancy Normotensive.  - [place] [facility] OB FOLLOW UP; Future  4. S/P primary low transverse C-section Desires TOLAC Consent next visit  Preterm labor symptoms and general obstetric precautions including but not limited to vaginal bleeding, contractions, leaking of fluid and fetal movement were reviewed in detail with the patient. Please refer to After Visit Summary for other counseling recommendations.  Return in about 2 weeks (around 09/29/2017) for ROB, 2 hr glucola next visit.  Future Appointments  Date Time Provider Department Center  09/22/2017 11:00 AM [facility] [place] 3 [facility]  09/29/2017  8:30 AM [facility] None  09/29/2017 10:15 AM Jiyah Torpey, Gigi Gin, MD [facility] None    Catalina Antigua, MD

## 2017-09-22 ENCOUNTER — Ambulatory Visit (HOSPITAL_COMMUNITY)
Admission: RE | Admit: 2017-09-22 | Discharge: 2017-09-22 | Disposition: A | Discharge status: Home / Self Care | Payer: Medicaid Other | Source: Ambulatory Visit | Attending: Obstetrics and Gynecology | Admitting: Obstetrics and Gynecology

## 2017-09-22 ENCOUNTER — Other Ambulatory Visit: Payer: Self-pay | Admitting: Obstetrics and Gynecology

## 2017-09-22 DIAGNOSIS — O99212 Obesity complicating pregnancy, second trimester: Secondary | ICD-10-CM

## 2017-09-22 DIAGNOSIS — Z362 Encounter for other antenatal screening follow-up: Secondary | ICD-10-CM

## 2017-09-22 DIAGNOSIS — O10919 Unspecified pre-existing hypertension complicating pregnancy, unspecified trimester: Secondary | ICD-10-CM

## 2017-09-22 DIAGNOSIS — O10012 Pre-existing essential hypertension complicating pregnancy, second trimester: Secondary | ICD-10-CM | POA: Insufficient documentation

## 2017-09-22 DIAGNOSIS — Z3A27 27 weeks gestation of pregnancy: Secondary | ICD-10-CM

## 2017-09-22 DIAGNOSIS — Z3480 Encounter for supervision of other normal pregnancy, unspecified trimester: Secondary | ICD-10-CM

## 2017-09-29 ENCOUNTER — Other Ambulatory Visit: Payer: Medicaid Other

## 2017-09-29 ENCOUNTER — Encounter: Payer: Medicaid Other | Admitting: Obstetrics and Gynecology

## 2017-10-09 ENCOUNTER — Ambulatory Visit (INDEPENDENT_AMBULATORY_CARE_PROVIDER_SITE_OTHER): Payer: Medicaid Other | Admitting: Obstetrics and Gynecology

## 2017-10-09 ENCOUNTER — Other Ambulatory Visit: Payer: Medicaid Other

## 2017-10-09 ENCOUNTER — Encounter: Payer: Self-pay | Admitting: Obstetrics and Gynecology

## 2017-10-09 VITALS — BP 137/81 | HR 91 | Wt 256.2 lb

## 2017-10-09 DIAGNOSIS — O9989 Other specified diseases and conditions complicating pregnancy, childbirth and the puerperium: Secondary | ICD-10-CM

## 2017-10-09 DIAGNOSIS — O26892 Other specified pregnancy related conditions, second trimester: Secondary | ICD-10-CM

## 2017-10-09 DIAGNOSIS — Z283 Underimmunization status: Secondary | ICD-10-CM

## 2017-10-09 DIAGNOSIS — Z6791 Unspecified blood type, Rh negative: Secondary | ICD-10-CM

## 2017-10-09 DIAGNOSIS — O10919 Unspecified pre-existing hypertension complicating pregnancy, unspecified trimester: Secondary | ICD-10-CM

## 2017-10-09 DIAGNOSIS — Z2839 Other underimmunization status: Secondary | ICD-10-CM

## 2017-10-09 DIAGNOSIS — O34219 Maternal care for unspecified type scar from previous cesarean delivery: Secondary | ICD-10-CM

## 2017-10-09 DIAGNOSIS — O10912 Unspecified pre-existing hypertension complicating pregnancy, second trimester: Secondary | ICD-10-CM

## 2017-10-09 DIAGNOSIS — Z3482 Encounter for supervision of other normal pregnancy, second trimester: Secondary | ICD-10-CM

## 2017-10-09 DIAGNOSIS — O26899 Other specified pregnancy related conditions, unspecified trimester: Secondary | ICD-10-CM

## 2017-10-09 DIAGNOSIS — Z3480 Encounter for supervision of other normal pregnancy, unspecified trimester: Secondary | ICD-10-CM

## 2017-10-09 DIAGNOSIS — Z98891 History of uterine scar from previous surgery: Secondary | ICD-10-CM

## 2017-10-09 NOTE — Patient Instructions (Signed)
Third Trimester of Pregnancy The third trimester is from week 28 through week 40 (months 7 through 9). The third trimester is a time when the unborn baby (fetus) is growing rapidly. At the end of the ninth month, the fetus is about 20 inches in length and weighs 6-10 pounds. Body changes during your third trimester Your body will continue to go through many changes during pregnancy. The changes vary from woman to woman. During the third trimester:  Your weight will continue to increase. You can expect to gain 25-35 pounds (11-16 kg) by the end of the pregnancy.  You may begin to get stretch marks on your hips, abdomen, and breasts.  You may urinate more often because the fetus is moving lower into your pelvis and pressing on your bladder.  You may develop or continue to have heartburn. This is caused by increased hormones that slow down muscles in the digestive tract.  You may develop or continue to have constipation because increased hormones slow digestion and cause the muscles that push waste through your intestines to relax.  You may develop hemorrhoids. These are swollen veins (varicose veins) in the rectum that can itch or be painful.  You may develop swollen, bulging veins (varicose veins) in your legs.  You may have increased body aches in the pelvis, back, or thighs. This is due to weight gain and increased hormones that are relaxing your joints.  You may have changes in your hair. These can include thickening of your hair, rapid growth, and changes in texture. Some women also have hair loss during or after pregnancy, or hair that feels dry or thin. Your hair will most likely return to normal after your baby is born.  Your breasts will continue to grow and they will continue to become tender. A yellow fluid (colostrum) may leak from your breasts. This is the first milk you are producing for your baby.  Your belly button may stick out.  You may notice more swelling in your hands,  face, or ankles.  You may have increased tingling or numbness in your hands, arms, and legs. The skin on your belly may also feel numb.  You may feel short of breath because of your expanding uterus.  You may have more problems sleeping. This can be caused by the size of your belly, increased need to urinate, and an increase in your body's metabolism.  You may notice the fetus "dropping," or moving lower in your abdomen (lightening).  You may have increased vaginal discharge.  You may notice your joints feel loose and you may have pain around your pelvic bone.  What to expect at prenatal visits You will have prenatal exams every 2 weeks until week 36. Then you will have weekly prenatal exams. During a routine prenatal visit:  You will be weighed to make sure you and the baby are growing normally.  Your blood pressure will be taken.  Your abdomen will be measured to track your baby's growth.  The fetal heartbeat will be listened to.  Any test results from the previous visit will be discussed.  You may have a cervical check near your due date to see if your cervix has softened or thinned (effaced).  You will be tested for Group B streptococcus. This happens between 35 and 37 weeks.  Your health care provider may ask you:  What your birth plan is.  How you are feeling.  If you are feeling the baby move.  If you have had   any abnormal symptoms, such as leaking fluid, bleeding, severe headaches, or abdominal cramping.  If you are using any tobacco products, including cigarettes, chewing tobacco, and electronic cigarettes.  If you have any questions.  Other tests or screenings that may be performed during your third trimester include:  Blood tests that check for low iron levels (anemia).  Fetal testing to check the health, activity level, and growth of the fetus. Testing is done if you have certain medical conditions or if there are problems during the  pregnancy.  Nonstress test (NST). This test checks the health of your baby to make sure there are no signs of problems, such as the baby not getting enough oxygen. During this test, a belt is placed around your belly. The baby is made to move, and its heart rate is monitored during movement.  What is false labor? False labor is a condition in which you feel small, irregular tightenings of the muscles in the womb (contractions) that usually go away with rest, changing position, or drinking water. These are called Braxton Hicks contractions. Contractions may last for hours, days, or even weeks before true labor sets in. If contractions come at regular intervals, become more frequent, increase in intensity, or become painful, you should see your health care provider. What are the signs of labor?  Abdominal cramps.  Regular contractions that start at 10 minutes apart and become stronger and more frequent with time.  Contractions that start on the top of the uterus and spread down to the lower abdomen and back.  Increased pelvic pressure and dull back pain.  A watery or bloody mucus discharge that comes from the vagina.  Leaking of amniotic fluid. This is also known as your "water breaking." It could be a slow trickle or a gush. Let your health care provider know if it has a color or strange odor. If you have any of these signs, call your health care provider right away, even if it is before your due date. Follow these instructions at home: Medicines  Follow your health care provider's instructions regarding medicine use. Specific medicines may be either safe or unsafe to take during pregnancy.  Take a prenatal vitamin that contains at least 600 micrograms (mcg) of folic acid.  If you develop constipation, try taking a stool softener if your health care provider approves. Eating and drinking  Eat a balanced diet that includes fresh fruits and vegetables, whole grains, good sources of protein  such as meat, eggs, or tofu, and low-fat dairy. Your health care provider will help you determine the amount of weight gain that is right for you.  Avoid raw meat and uncooked cheese. These carry germs that can cause birth defects in the baby.  If you have low calcium intake from food, talk to your health care provider about whether you should take a daily calcium supplement.  Eat four or five small meals rather than three large meals a day.  Limit foods that are high in fat and processed sugars, such as fried and sweet foods.  To prevent constipation: ? Drink enough fluid to keep your urine clear or pale yellow. ? Eat foods that are high in fiber, such as fresh fruits and vegetables, whole grains, and beans. Activity  Exercise only as directed by your health care provider. Most women can continue their usual exercise routine during pregnancy. Try to exercise for 30 minutes at least 5 days a week. Stop exercising if you experience uterine contractions.  Avoid heavy   lifting.  Do not exercise in extreme heat or humidity, or at high altitudes.  Wear low-heel, comfortable shoes.  Practice good posture.  You may continue to have sex unless your health care provider tells you otherwise. Relieving pain and discomfort  Take frequent breaks and rest with your legs elevated if you have leg cramps or low back pain.  Take warm sitz baths to soothe any pain or discomfort caused by hemorrhoids. Use hemorrhoid cream if your health care provider approves.  Wear a good support bra to prevent discomfort from breast tenderness.  If you develop varicose veins: ? Wear support pantyhose or compression stockings as told by your healthcare provider. ? Elevate your feet for 15 minutes, 3-4 times a day. Prenatal care  Write down your questions. Take them to your prenatal visits.  Keep all your prenatal visits as told by your health care provider. This is important. Safety  Wear your seat belt at  all times when driving.  Make a list of emergency phone numbers, including numbers for family, friends, the hospital, and police and fire departments. General instructions  Avoid cat litter boxes and soil used by cats. These carry germs that can cause birth defects in the baby. If you have a cat, ask someone to clean the litter box for you.  Do not travel far distances unless it is absolutely necessary and only with the approval of your health care provider.  Do not use hot tubs, steam rooms, or saunas.  Do not drink alcohol.  Do not use any products that contain nicotine or tobacco, such as cigarettes and e-cigarettes. If you need help quitting, ask your health care provider.  Do not use any medicinal herbs or unprescribed drugs. These chemicals affect the formation and growth of the baby.  Do not douche or use tampons or scented sanitary pads.  Do not cross your legs for long periods of time.  To prepare for the arrival of your baby: ? Take prenatal classes to understand, practice, and ask questions about labor and delivery. ? Make a trial run to the hospital. ? Visit the hospital and tour the maternity area. ? Arrange for maternity or paternity leave through employers. ? Arrange for family and friends to take care of pets while you are in the hospital. ? Purchase a rear-facing car seat and make sure you know how to install it in your car. ? Pack your hospital bag. ? Prepare the baby's nursery. Make sure to remove all pillows and stuffed animals from the baby's crib to prevent suffocation.  Visit your dentist if you have not gone during your pregnancy. Use a soft toothbrush to brush your teeth and be gentle when you floss. Contact a health care provider if:  You are unsure if you are in labor or if your water has broken.  You become dizzy.  You have mild pelvic cramps, pelvic pressure, or nagging pain in your abdominal area.  You have lower back pain.  You have persistent  nausea, vomiting, or diarrhea.  You have an unusual or bad smelling vaginal discharge.  You have pain when you urinate. Get help right away if:  Your water breaks before 37 weeks.  You have regular contractions less than 5 minutes apart before 37 weeks.  You have a fever.  You are leaking fluid from your vagina.  You have spotting or bleeding from your vagina.  You have severe abdominal pain or cramping.  You have rapid weight loss or weight gain.    You have shortness of breath with chest pain.  You notice sudden or extreme swelling of your face, hands, ankles, feet, or legs.  Your baby makes fewer than 10 movements in 2 hours.  You have severe headaches that do not go away when you take medicine.  You have vision changes. Summary  The third trimester is from week 28 through week 40, months 7 through 9. The third trimester is a time when the unborn baby (fetus) is growing rapidly.  During the third trimester, your discomfort may increase as you and your baby continue to gain weight. You may have abdominal, leg, and back pain, sleeping problems, and an increased need to urinate.  During the third trimester your breasts will keep growing and they will continue to become tender. A yellow fluid (colostrum) may leak from your breasts. This is the first milk you are producing for your baby.  False labor is a condition in which you feel small, irregular tightenings of the muscles in the womb (contractions) that eventually go away. These are called Braxton Hicks contractions. Contractions may last for hours, days, or even weeks before true labor sets in.  Signs of labor can include: abdominal cramps; regular contractions that start at 10 minutes apart and become stronger and more frequent with time; watery or bloody mucus discharge that comes from the vagina; increased pelvic pressure and dull back pain; and leaking of amniotic fluid. This information is not intended to replace advice  given to you by your health care provider. Make sure you discuss any questions you have with your health care provider. Document Released: 04/30/2001 Document Revised: 10/12/2015 Document Reviewed: 07/07/2012 Elsevier Interactive Patient Education  2017 [employer]  

## 2017-10-09 NOTE — Progress Notes (Signed)
Subjective:  Brittany Cordova is a 27 y.o. G2P1001 at [redacted]w[redacted]d being seen today for ongoing prenatal care.  She is currently monitored for the following issues for this high-risk pregnancy and has ANEMIA; ALLERGIC RHINITIS; ANGIOEDEMA; Pruritus; Obstructive sleep apnea; Rh negative, antepartum; Chronic hypertension affecting pregnancy; Rubella non-immune status, antepartum; S/P primary low transverse C-section; LGSIL on Pap smear of cervix; and Supervision of other normal pregnancy, antepartum on their problem list.  Patient reports no complaints.  Contractions: Not present. Vag. Bleeding: None.  Movement: Present. Denies leaking of fluid.   The following portions of the patient's history were reviewed and updated as appropriate: allergies, current medications, past family history, past medical history, past social history, past surgical history and problem list. Problem list updated.  Objective:   Vitals:   10/09/17 0820  BP: 137/81  Pulse: 91  Weight: 256 lb 3.2 oz (116.2 kg)    Fetal Status: Fetal Heart Rate (bpm): 140   Movement: Present     General:  Alert, oriented and cooperative. Patient is in no acute distress.  Skin: Skin is warm and dry. No rash noted.   Cardiovascular: Normal heart rate noted  Respiratory: Normal respiratory effort, no problems with respiration noted  Abdomen: Soft, gravid, appropriate for gestational age. Pain/Pressure: Absent     Pelvic:  Cervical exam deferred        Extremities: Normal range of motion.  Edema: Trace  Mental Status: Normal mood and affect. Normal behavior. Normal judgment and thought content.   Urinalysis:      Assessment and Plan:  Pregnancy: G2P1001 at [redacted]w[redacted]d  1. Encounter for supervision of other normal pregnancy in second trimester Stable - Glucose Tolerance, 2 Hours w/1 Hour - CBC - RPR - HIV antibody  2. Supervision of other normal pregnancy, antepartum Stable  3. Chronic hypertension affecting pregnancy BP stable  without meds Unable to take BASA d/t allergy CHTN and pregnancy reviewed with pt and mother. U/S for growth and antenatal testing reviewed with pt.  Pt and mother had several questions about these test as she did not do these tests with her last pregnancy. I explained to pt and mother that these tests are per recommendations of current current guidelines and standard of care.  - [place] MFM FETAL BPP WO NON STRESS; Future - [place] [facility] OB FOLLOW UP; Future - [place] MFM FETAL BPP WO NON STRESS; Future - [place] MFM FETAL BPP WO NON STRESS; Future - [place] MFM FETAL BPP WO NON STRESS; Future - [place] MFM FETAL BPP WO NON STRESS; Future - [place] MFM FETAL BPP WO NON STRESS; Future - [place] MFM FETAL BPP WO NON STRESS; Future - [place] MFM FETAL BPP WO NON STRESS; Future - [place] MFM FETAL BPP WO NON STRESS; Future  4. Rh negative, antepartum Will need next visit  5. Rubella non-immune status, antepartum Vaccine PP  6. S/P primary low transverse C-section Desires VBAC. VBAC vs RLTCS reviewed with pt Consent obtained Discussed [facility] at 40 weeks [facility]. Pt is reluctant to have IOL d/t to bad experience with last pregnancy. IOL lead to c section Discussed reasons for IOL.   Preterm labor symptoms and general obstetric precautions including but not limited to vaginal bleeding, contractions, leaking of fluid and fetal movement were reviewed in detail with the patient. Please refer to After Visit Summary for other counseling recommendations.  Return in about 3 weeks (around 10/30/2017).   Hermina Staggers, MD

## 2017-10-10 LAB — CBC
HEMATOCRIT: 33.9 % — AB (ref 34.0–46.6)
Hemoglobin: 10.5 g/dL — ABNORMAL LOW (ref 11.1–15.9)
MCH: 24.5 pg — AB (ref 26.6–33.0)
MCHC: 31 g/dL — ABNORMAL LOW (ref 31.5–35.7)
MCV: 79 fL (ref 79–97)
PLATELETS: 231 10*3/uL (ref 150–450)
RBC: 4.28 x10E6/uL (ref 3.77–5.28)
RDW: 15.6 % — ABNORMAL HIGH (ref 12.3–15.4)
WBC: 9.5 10*3/uL (ref 3.4–10.8)

## 2017-10-10 LAB — GLUCOSE TOLERANCE, 2 HOURS W/ 1HR
GLUCOSE, FASTING: 71 mg/dL (ref 65–91)
Glucose, 1 hour: 130 mg/dL (ref 65–179)
Glucose, 2 hour: 88 mg/dL (ref 65–152)

## 2017-10-10 LAB — RPR: RPR Ser Ql: NONREACTIVE

## 2017-10-10 LAB — HIV ANTIBODY (ROUTINE TESTING W REFLEX): HIV Screen 4th Generation wRfx: NONREACTIVE

## 2017-10-27 ENCOUNTER — Ambulatory Visit (INDEPENDENT_AMBULATORY_CARE_PROVIDER_SITE_OTHER): Payer: Medicaid Other | Admitting: Obstetrics & Gynecology

## 2017-10-27 ENCOUNTER — Other Ambulatory Visit: Payer: Self-pay | Admitting: Obstetrics and Gynecology

## 2017-10-27 ENCOUNTER — Ambulatory Visit (HOSPITAL_COMMUNITY)
Admission: RE | Admit: 2017-10-27 | Discharge: 2017-10-27 | Disposition: A | Discharge status: Home / Self Care | Payer: Medicaid Other | Source: Ambulatory Visit | Attending: Obstetrics and Gynecology | Admitting: Obstetrics and Gynecology

## 2017-10-27 VITALS — BP 126/84 | HR 81 | Wt 259.8 lb

## 2017-10-27 DIAGNOSIS — IMO0002 Reserved for concepts with insufficient information to code with codable children: Secondary | ICD-10-CM

## 2017-10-27 DIAGNOSIS — Z3A32 32 weeks gestation of pregnancy: Secondary | ICD-10-CM

## 2017-10-27 DIAGNOSIS — Z6791 Unspecified blood type, Rh negative: Secondary | ICD-10-CM | POA: Diagnosis not present

## 2017-10-27 DIAGNOSIS — O26893 Other specified pregnancy related conditions, third trimester: Secondary | ICD-10-CM | POA: Insufficient documentation

## 2017-10-27 DIAGNOSIS — Z362 Encounter for other antenatal screening follow-up: Secondary | ICD-10-CM | POA: Insufficient documentation

## 2017-10-27 DIAGNOSIS — O34219 Maternal care for unspecified type scar from previous cesarean delivery: Secondary | ICD-10-CM | POA: Insufficient documentation

## 2017-10-27 DIAGNOSIS — E669 Obesity, unspecified: Secondary | ICD-10-CM | POA: Insufficient documentation

## 2017-10-27 DIAGNOSIS — O26899 Other specified pregnancy related conditions, unspecified trimester: Secondary | ICD-10-CM

## 2017-10-27 DIAGNOSIS — O10913 Unspecified pre-existing hypertension complicating pregnancy, third trimester: Secondary | ICD-10-CM | POA: Diagnosis not present

## 2017-10-27 DIAGNOSIS — O99213 Obesity complicating pregnancy, third trimester: Secondary | ICD-10-CM

## 2017-10-27 DIAGNOSIS — O10919 Unspecified pre-existing hypertension complicating pregnancy, unspecified trimester: Secondary | ICD-10-CM

## 2017-10-27 DIAGNOSIS — Z0489 Encounter for examination and observation for other specified reasons: Secondary | ICD-10-CM

## 2017-10-27 DIAGNOSIS — Z3480 Encounter for supervision of other normal pregnancy, unspecified trimester: Secondary | ICD-10-CM

## 2017-10-27 NOTE — Progress Notes (Signed)
   PRENATAL VISIT NOTE  Subjective:  Brittany Cordova is a 27 y.o. G2P1001 at 4345w1d being seen today for ongoing prenatal care.  She is currently monitored for the following issues for this high-risk pregnancy and has ANEMIA; ALLERGIC RHINITIS; ANGIOEDEMA; Pruritus; Obstructive sleep apnea; Rh negative, antepartum; Chronic hypertension affecting pregnancy; Rubella non-immune status, antepartum; S/P primary low transverse C-section; LGSIL on Pap smear of cervix; and Supervision of other normal pregnancy, antepartum on their problem list.  Patient reports no complaints.  Contractions: Not present. Vag. Bleeding: None.  Movement: Present. Denies leaking of fluid.   The following portions of the patient's history were reviewed and updated as appropriate: allergies, current medications, past family history, past medical history, past social history, past surgical history and problem list. Problem list updated.  Objective:   Vitals:   10/27/17 0826  BP: 126/84  Pulse: 81  Weight: 259 lb 12.8 oz (117.8 kg)    Fetal Status: Fetal Heart Rate (bpm): 150   Movement: Present     General:  Alert, oriented and cooperative. Patient is in no acute distress.  Skin: Skin is warm and dry. No rash noted.   Cardiovascular: Normal heart rate noted  Respiratory: Normal respiratory effort, no problems with respiration noted  Abdomen: Soft, gravid, appropriate for gestational age.  Pain/Pressure: Absent     Pelvic: Cervical exam deferred        Extremities: Normal range of motion.  Edema: Trace  Mental Status: Normal mood and affect. Normal behavior. Normal judgment and thought content.   Assessment and Plan:  Pregnancy: G2P1001 at 5545w1d  1. Chronic hypertension affecting pregnancy Normotensive on no medication  2. Supervision of other normal pregnancy, antepartum Growth US at MFM today  Preterm labor symptoms and general obstetric precautions including but not limited to vaginal bleeding,  contractions, leaking of fluid and fetal movement were reviewed in detail with the patient. Please refer to After Visit Summary for other counseling recommendations.  Return in about 1 week (around 11/03/2017).  Future Appointments  Date Time Provider Department Center  10/27/2017  8:45 AM [facility] US 2 [facility]  11/03/2017  9:15 AM [facility] US 4 [facility]-US  11/03/2017 10:30 AM Constant, Gigi GinPeggy, MD [facility] None  11/10/2017  8:15 AM Conan Bowensavis, Kelly M, MD [facility] None  11/10/2017  9:00 AM [facility] US 3 [facility]-US  11/17/2017  8:30 AM [facility] US 1 [facility]-US  11/17/2017  9:45 AM Constant, Gigi GinPeggy, MD [facility] None  11/24/2017 10:15 AM [facility] US 4 [facility]-US  12/01/2017 10:15 AM [facility] US 4 [facility]-US  12/08/2017  8:45 AM [facility] US 2 [facility]-US  12/15/2017  8:45 AM [facility] US 2 [facility] M[facility]-US    Scheryl DarterJames Eliyas Suddreth, MD

## 2017-10-27 NOTE — Patient Instructions (Signed)

## 2017-10-28 ENCOUNTER — Encounter: Payer: Self-pay | Admitting: *Deleted

## 2017-11-03 ENCOUNTER — Ambulatory Visit (HOSPITAL_COMMUNITY)
Admission: RE | Admit: 2017-11-03 | Discharge: 2017-11-03 | Disposition: A | Discharge status: Home / Self Care | Payer: Medicaid Other | Source: Ambulatory Visit | Attending: Obstetrics and Gynecology | Admitting: Obstetrics and Gynecology

## 2017-11-03 ENCOUNTER — Ambulatory Visit (INDEPENDENT_AMBULATORY_CARE_PROVIDER_SITE_OTHER): Payer: Medicaid Other | Admitting: Obstetrics and Gynecology

## 2017-11-03 ENCOUNTER — Other Ambulatory Visit: Payer: Self-pay | Admitting: Obstetrics and Gynecology

## 2017-11-03 ENCOUNTER — Encounter: Payer: Self-pay | Admitting: Obstetrics and Gynecology

## 2017-11-03 VITALS — BP 126/85 | HR 83 | Wt 261.4 lb

## 2017-11-03 DIAGNOSIS — O34219 Maternal care for unspecified type scar from previous cesarean delivery: Secondary | ICD-10-CM | POA: Insufficient documentation

## 2017-11-03 DIAGNOSIS — O99213 Obesity complicating pregnancy, third trimester: Secondary | ICD-10-CM | POA: Diagnosis not present

## 2017-11-03 DIAGNOSIS — Z3A33 33 weeks gestation of pregnancy: Secondary | ICD-10-CM | POA: Insufficient documentation

## 2017-11-03 DIAGNOSIS — O26893 Other specified pregnancy related conditions, third trimester: Secondary | ICD-10-CM | POA: Diagnosis not present

## 2017-11-03 DIAGNOSIS — O9989 Other specified diseases and conditions complicating pregnancy, childbirth and the puerperium: Secondary | ICD-10-CM

## 2017-11-03 DIAGNOSIS — Z6791 Unspecified blood type, Rh negative: Secondary | ICD-10-CM | POA: Diagnosis not present

## 2017-11-03 DIAGNOSIS — O26899 Other specified pregnancy related conditions, unspecified trimester: Secondary | ICD-10-CM

## 2017-11-03 DIAGNOSIS — Z3480 Encounter for supervision of other normal pregnancy, unspecified trimester: Secondary | ICD-10-CM

## 2017-11-03 DIAGNOSIS — O10013 Pre-existing essential hypertension complicating pregnancy, third trimester: Secondary | ICD-10-CM | POA: Insufficient documentation

## 2017-11-03 DIAGNOSIS — Z283 Underimmunization status: Secondary | ICD-10-CM

## 2017-11-03 DIAGNOSIS — O10919 Unspecified pre-existing hypertension complicating pregnancy, unspecified trimester: Secondary | ICD-10-CM

## 2017-11-03 DIAGNOSIS — O09899 Supervision of other high risk pregnancies, unspecified trimester: Secondary | ICD-10-CM

## 2017-11-03 NOTE — Addendum Note (Signed)
Addended by: Frutoso ChaseOX, Urvi Imes on: 11/03/2017 01:00 PM   Modules accepted: Orders

## 2017-11-03 NOTE — Progress Notes (Signed)
   PRENATAL VISIT NOTE  Subjective:  Brittany Cordova is a 27 y.o. G2P1001 at 7661w1d being seen today for ongoing prenatal care.  She is currently monitored for the following issues for this high-risk pregnancy and has ANEMIA; ALLERGIC RHINITIS; ANGIOEDEMA; Pruritus; Obstructive sleep apnea; Rh negative, antepartum; Chronic hypertension affecting pregnancy; Rubella non-immune status, antepartum; S/P primary low transverse C-section; LGSIL on Pap smear of cervix; Supervision of other normal pregnancy, antepartum; Obesity affecting pregnancy in third trimester; Previous cesarean delivery affecting pregnancy; and Rh negative state in antepartum period on their problem list.  Patient reports no complaints.  Contractions: Not present. Vag. Bleeding: None.  Movement: Present. Denies leaking of fluid.   The following portions of the patient's history were reviewed and updated as appropriate: allergies, current medications, past family history, past medical history, past social history, past surgical history and problem list. Problem list updated.  Objective:   Vitals:   11/03/17 1057  BP: 126/85  Pulse: 83  Weight: 261 lb 6.4 oz (118.6 kg)    Fetal Status: Fetal Heart Rate (bpm): 142 Fundal Height: 33 cm Movement: Present     General:  Alert, oriented and cooperative. Patient is in no acute distress.  Skin: Skin is warm and dry. No rash noted.   Cardiovascular: Normal heart rate noted  Respiratory: Normal respiratory effort, no problems with respiration noted  Abdomen: Soft, gravid, appropriate for gestational age.  Pain/Pressure: Present     Pelvic: Cervical exam deferred        Extremities: Normal range of motion.  Edema: Trace  Mental Status: Normal mood and affect. Normal behavior. Normal judgment and thought content.   Assessment and Plan:  Pregnancy: G2P1001 at 2961w1d  1. Supervision of other normal pregnancy, antepartum Patient is doing well without complaints Patient is  considering BTL- medicaid form signed Also discussed LARC Patient with CHTN- normotensive without medication Continue antenatal testing BPP 8/8 today NST reviewed and reactive with baseline 150, mod variability, + accels, no decels  2. Previous cesarean delivery affecting pregnancy Patient desires TOLAC and signed consent last visit  3. Rh negative state in antepartum period S/p rhogam  4. Obesity affecting pregnancy in third trimester   5. Rubella non-immune status, antepartum Will offer pp  Preterm labor symptoms and general obstetric precautions including but not limited to vaginal bleeding, contractions, leaking of fluid and fetal movement were reviewed in detail with the patient. Please refer to After Visit Summary for other counseling recommendations.  No follow-ups on file.  Future Appointments  Date Time Provider Department Center  11/10/2017  8:15 AM Conan Bowensavis, Kelly M, MD [facility] None  11/10/2017  9:00 AM [facility] US 3 [facility] M[facility]-US  11/17/2017  8:30 AM [facility] US 1 [facility]-US  11/17/2017  9:45 AM Chancy Claros, Gigi GinPeggy, MD [facility] None  11/24/2017 10:15 AM [facility] US 4 [facility] M[facility]-US  12/01/2017 10:15 AM [facility] US 4 [facility] M[facility]-US  12/08/2017  8:45 AM [facility] US 2 [facility]-US  12/15/2017  8:45 AM [facility] US 2 [facility] M[facility]-US    Catalina AntiguaPeggy Jaidee Stipe, MD

## 2017-11-10 ENCOUNTER — Other Ambulatory Visit: Payer: Self-pay | Admitting: Obstetrics and Gynecology

## 2017-11-10 ENCOUNTER — Ambulatory Visit (INDEPENDENT_AMBULATORY_CARE_PROVIDER_SITE_OTHER): Payer: Medicaid Other | Admitting: Obstetrics and Gynecology

## 2017-11-10 ENCOUNTER — Ambulatory Visit (HOSPITAL_COMMUNITY)
Admission: RE | Admit: 2017-11-10 | Discharge: 2017-11-10 | Disposition: A | Discharge status: Home / Self Care | Payer: Medicaid Other | Source: Ambulatory Visit | Attending: Obstetrics and Gynecology | Admitting: Obstetrics and Gynecology

## 2017-11-10 ENCOUNTER — Encounter: Payer: Self-pay | Admitting: Obstetrics and Gynecology

## 2017-11-10 VITALS — BP 136/83 | HR 81 | Wt 261.9 lb

## 2017-11-10 DIAGNOSIS — O99213 Obesity complicating pregnancy, third trimester: Secondary | ICD-10-CM | POA: Insufficient documentation

## 2017-11-10 DIAGNOSIS — O34219 Maternal care for unspecified type scar from previous cesarean delivery: Secondary | ICD-10-CM

## 2017-11-10 DIAGNOSIS — O26893 Other specified pregnancy related conditions, third trimester: Secondary | ICD-10-CM | POA: Diagnosis not present

## 2017-11-10 DIAGNOSIS — Z2839 Other underimmunization status: Secondary | ICD-10-CM

## 2017-11-10 DIAGNOSIS — Z6791 Unspecified blood type, Rh negative: Secondary | ICD-10-CM

## 2017-11-10 DIAGNOSIS — Z3009 Encounter for other general counseling and advice on contraception: Secondary | ICD-10-CM | POA: Insufficient documentation

## 2017-11-10 DIAGNOSIS — O360130 Maternal care for anti-D [Rh] antibodies, third trimester, not applicable or unspecified: Secondary | ICD-10-CM | POA: Insufficient documentation

## 2017-11-10 DIAGNOSIS — O9989 Other specified diseases and conditions complicating pregnancy, childbirth and the puerperium: Secondary | ICD-10-CM

## 2017-11-10 DIAGNOSIS — Z3A34 34 weeks gestation of pregnancy: Secondary | ICD-10-CM | POA: Insufficient documentation

## 2017-11-10 DIAGNOSIS — O10919 Unspecified pre-existing hypertension complicating pregnancy, unspecified trimester: Secondary | ICD-10-CM

## 2017-11-10 DIAGNOSIS — O10013 Pre-existing essential hypertension complicating pregnancy, third trimester: Secondary | ICD-10-CM | POA: Diagnosis not present

## 2017-11-10 DIAGNOSIS — Z3480 Encounter for supervision of other normal pregnancy, unspecified trimester: Secondary | ICD-10-CM

## 2017-11-10 DIAGNOSIS — R87612 Low grade squamous intraepithelial lesion on cytologic smear of cervix (LGSIL): Secondary | ICD-10-CM

## 2017-11-10 DIAGNOSIS — Z283 Underimmunization status: Secondary | ICD-10-CM

## 2017-11-10 DIAGNOSIS — O26899 Other specified pregnancy related conditions, unspecified trimester: Secondary | ICD-10-CM

## 2017-11-10 NOTE — Progress Notes (Signed)
   PRENATAL VISIT NOTE  Subjective:  Brittany Cordova is a 27 y.o. G2P1001 at 81w1dbeing seen today for ongoing prenatal care.  She is currently monitored for the following issues for this high-risk pregnancy and has ANEMIA; ALLERGIC RHINITIS; ANGIOEDEMA; Pruritus; Obstructive sleep apnea; Rh negative, antepartum; Chronic hypertension affecting pregnancy; Rubella non-immune status, antepartum; S/P primary low transverse C-section; LGSIL on Pap smear of cervix; Supervision of other normal pregnancy, antepartum; Obesity affecting pregnancy in third trimester; Previous cesarean delivery affecting pregnancy; Rh negative state in antepartum period; and Unwanted fertility on their problem list.  Patient reports no complaints.  Contractions: Not present. Vag. Bleeding: None.  Movement: Present. Denies leaking of fluid.   The following portions of the patient's history were reviewed and updated as appropriate: allergies, current medications, past family history, past medical history, past social history, past surgical history and problem list. Problem list updated.  Objective:   Vitals:   11/10/17 0837  BP: 136/83  Pulse: 81  Weight: 261 lb 14.4 oz (118.8 kg)    Fetal Status: Fetal Heart Rate (bpm): 148   Movement: Present     General:  Alert, oriented and cooperative. Patient is in no acute distress.  Skin: Skin is warm and dry. No rash noted.   Cardiovascular: Normal heart rate noted  Respiratory: Normal respiratory effort, no problems with respiration noted  Abdomen: Soft, gravid, appropriate for gestational age.  Pain/Pressure: Present     Pelvic: Cervical exam deferred        Extremities: Normal range of motion.  Edema: Trace  Mental Status: Normal mood and affect. Normal behavior. Normal judgment and thought content.   Assessment and Plan:  Pregnancy: G2P1001 at 370w1d1. Supervision of other normal pregnancy, antepartum  2. Chronic hypertension affecting pregnancy Stable  with no meds Cont weekly BPP (last 6/17, 8/8) Has BPP today  3. Previous cesarean delivery affecting pregnancy For TOLAC  4. Rh negative state in antepartum period S/p Rhogam  5. Rubella non-immune status, antepartum MMR pp  6. Unwanted fertility Resigned BTL papers today   Preterm labor symptoms and general obstetric precautions including but not limited to vaginal bleeding, contractions, leaking of fluid and fetal movement were reviewed in detail with the patient. Please refer to After Visit Summary for other counseling recommendations.  Return in about 2 weeks (around 11/24/2017) for OB visit (MD).  Future Appointments  Date Time Provider [place]7/05/2017  8:30 AM [facility] [place] [facility]  11/17/2017  9:45 AM [place], [place] [facility]  11/24/2017 10:15 AM [facility] [place] [facility] M[facility]-US  12/01/2017 10:15 AM [facility] [place] [facility] M[facility]-US  12/08/2017  8:45 AM [facility] [place] [facility] M[facility]-US  12/15/2017  8:45 AM [facility] [place] [facility] MFC-US    KeSloan LeiterMD

## 2017-11-17 ENCOUNTER — Ambulatory Visit (HOSPITAL_COMMUNITY)
Admission: RE | Admit: 2017-11-17 | Discharge: 2017-11-17 | Disposition: A | Discharge status: Home / Self Care | Payer: Medicaid Other | Source: Ambulatory Visit | Attending: Obstetrics and Gynecology | Admitting: Obstetrics and Gynecology

## 2017-11-17 ENCOUNTER — Encounter: Payer: Self-pay | Admitting: Obstetrics and Gynecology

## 2017-11-17 ENCOUNTER — Ambulatory Visit (INDEPENDENT_AMBULATORY_CARE_PROVIDER_SITE_OTHER): Payer: Medicaid Other | Admitting: Obstetrics and Gynecology

## 2017-11-17 VITALS — BP 128/86 | HR 84 | Wt 262.0 lb

## 2017-11-17 DIAGNOSIS — Z3480 Encounter for supervision of other normal pregnancy, unspecified trimester: Secondary | ICD-10-CM

## 2017-11-17 DIAGNOSIS — Z3A35 35 weeks gestation of pregnancy: Secondary | ICD-10-CM | POA: Diagnosis not present

## 2017-11-17 DIAGNOSIS — O10913 Unspecified pre-existing hypertension complicating pregnancy, third trimester: Secondary | ICD-10-CM | POA: Diagnosis not present

## 2017-11-17 DIAGNOSIS — O10919 Unspecified pre-existing hypertension complicating pregnancy, unspecified trimester: Secondary | ICD-10-CM | POA: Diagnosis present

## 2017-11-17 DIAGNOSIS — Z3A36 36 weeks gestation of pregnancy: Secondary | ICD-10-CM | POA: Diagnosis not present

## 2017-11-17 DIAGNOSIS — O34219 Maternal care for unspecified type scar from previous cesarean delivery: Secondary | ICD-10-CM

## 2017-11-17 NOTE — Progress Notes (Signed)
   PRENATAL VISIT NOTE  Subjective:  Brittany Cordova is a 27 y.o. G2P1001 at 7749w1d being seen today for ongoing prenatal care.  She is currently monitored for the following issues for this high-risk pregnancy and has ANEMIA; ALLERGIC RHINITIS; ANGIOEDEMA; Pruritus; Obstructive sleep apnea; Rh negative, antepartum; Chronic hypertension affecting pregnancy; Rubella non-immune status, antepartum; S/P primary low transverse C-section; LGSIL on Pap smear of cervix; Supervision of other normal pregnancy, antepartum; Obesity affecting pregnancy in third trimester; Previous cesarean delivery affecting pregnancy; Rh negative state in antepartum period; and Unwanted fertility on their problem list.  Patient reports pelvic pressure which makes it difficult to move and sleep.  Contractions: Irregular. Vag. Bleeding: None.  Movement: Present. Denies leaking of fluid.   The following portions of the patient's history were reviewed and updated as appropriate: allergies, current medications, past family history, past medical history, past social history, past surgical history and problem list. Problem list updated.  Objective:   Vitals:   11/17/17 1001  BP: 128/86  Pulse: 84  Weight: 262 lb (118.8 kg)    Fetal Status: Fetal Heart Rate (bpm): 140 Fundal Height: 35 cm Movement: Present     General:  Alert, oriented and cooperative. Patient is in no acute distress.  Skin: Skin is warm and dry. No rash noted.   Cardiovascular: Normal heart rate noted  Respiratory: Normal respiratory effort, no problems with respiration noted  Abdomen: Soft, gravid, appropriate for gestational age.  Pain/Pressure: Present     Pelvic: Cervical exam deferred        Extremities: Normal range of motion.     Mental Status: Normal mood and affect. Normal behavior. Normal judgment and thought content.   Assessment and Plan:  Pregnancy: G2P1001 at 7449w1d  1. Supervision of other normal pregnancy, antepartum Patient is  doing well Encouraged patient to remain active despite discomfort Cultures next visit Considering BTL  2. Chronic hypertension affecting pregnancy Normotensive without medication Patient unable to stay for NST. BPP 8/8 this morning Plan for IOL at 40 weeks pending no clinical change  3. Previous cesarean delivery affecting pregnancy Desires TOLAC  Preterm labor symptoms and general obstetric precautions including but not limited to vaginal bleeding, contractions, leaking of fluid and fetal movement were reviewed in detail with the patient. Please refer to After Visit Summary for other counseling recommendations.  Return in about 1 week (around 11/24/2017) for ROB, NST.  Future Appointments  Date Time Provider Department Center  11/24/2017 10:15 AM [facility] US 4 [facility] M[facility]-US  12/01/2017 10:15 AM [facility] US 4 [facility]US M[facility]-US  12/08/2017  8:45 AM [facility] US 2 [facility]-M[facility]US MFC-US  12/15/2017  8:45 AM [facility] US 2 [facility]-M[facility] MFC-US    Catalina AntiguaPeggy Baudelia Schroepfer, MD

## 2017-11-17 NOTE — Progress Notes (Signed)
Pt states increase in pelvic pressure. Made aware GBS will be done at or around 36 weeks.

## 2017-11-24 ENCOUNTER — Ambulatory Visit (HOSPITAL_COMMUNITY)
Admission: RE | Admit: 2017-11-24 | Discharge: 2017-11-24 | Disposition: A | Discharge status: Home / Self Care | Payer: Medicaid Other | Source: Ambulatory Visit | Attending: Obstetrics and Gynecology | Admitting: Obstetrics and Gynecology

## 2017-11-24 ENCOUNTER — Other Ambulatory Visit (HOSPITAL_COMMUNITY)
Admission: RE | Admit: 2017-11-24 | Discharge: 2017-11-24 | Disposition: A | Discharge status: Home / Self Care | Payer: Medicaid Other | Source: Ambulatory Visit | Attending: Obstetrics and Gynecology | Admitting: Obstetrics and Gynecology

## 2017-11-24 ENCOUNTER — Other Ambulatory Visit: Payer: Self-pay | Admitting: Obstetrics and Gynecology

## 2017-11-24 ENCOUNTER — Encounter: Payer: Self-pay | Admitting: Obstetrics and Gynecology

## 2017-11-24 ENCOUNTER — Ambulatory Visit (INDEPENDENT_AMBULATORY_CARE_PROVIDER_SITE_OTHER): Payer: Medicaid Other | Admitting: Obstetrics and Gynecology

## 2017-11-24 VITALS — BP 143/84 | HR 89 | Wt 268.3 lb

## 2017-11-24 DIAGNOSIS — Z3480 Encounter for supervision of other normal pregnancy, unspecified trimester: Secondary | ICD-10-CM

## 2017-11-24 DIAGNOSIS — Z3A36 36 weeks gestation of pregnancy: Secondary | ICD-10-CM

## 2017-11-24 DIAGNOSIS — O10913 Unspecified pre-existing hypertension complicating pregnancy, third trimester: Secondary | ICD-10-CM

## 2017-11-24 DIAGNOSIS — O10013 Pre-existing essential hypertension complicating pregnancy, third trimester: Secondary | ICD-10-CM | POA: Insufficient documentation

## 2017-11-24 DIAGNOSIS — O99213 Obesity complicating pregnancy, third trimester: Secondary | ICD-10-CM

## 2017-11-24 DIAGNOSIS — O10919 Unspecified pre-existing hypertension complicating pregnancy, unspecified trimester: Secondary | ICD-10-CM

## 2017-11-24 DIAGNOSIS — Z98891 History of uterine scar from previous surgery: Secondary | ICD-10-CM

## 2017-11-24 DIAGNOSIS — O9989 Other specified diseases and conditions complicating pregnancy, childbirth and the puerperium: Secondary | ICD-10-CM

## 2017-11-24 DIAGNOSIS — O26899 Other specified pregnancy related conditions, unspecified trimester: Secondary | ICD-10-CM

## 2017-11-24 DIAGNOSIS — O09899 Supervision of other high risk pregnancies, unspecified trimester: Secondary | ICD-10-CM

## 2017-11-24 DIAGNOSIS — Z283 Underimmunization status: Secondary | ICD-10-CM

## 2017-11-24 DIAGNOSIS — Z6791 Unspecified blood type, Rh negative: Secondary | ICD-10-CM

## 2017-11-24 DIAGNOSIS — O34219 Maternal care for unspecified type scar from previous cesarean delivery: Secondary | ICD-10-CM

## 2017-11-24 LAB — OB RESULTS CONSOLE GC/CHLAMYDIA: GC PROBE AMP, GENITAL: NEGATIVE

## 2017-11-24 NOTE — Progress Notes (Signed)
PRENATAL VISIT NOTE  Subjective:  Brittany Cordova is a 27 y.o. G2P1001 at [redacted]w[redacted]d being seen today for ongoing prenatal care.  She is currently monitored for the following issues for this high-risk pregnancy and has ANEMIA; ALLERGIC RHINITIS; ANGIOEDEMA; Pruritus; Obstructive sleep apnea; Rh negative, antepartum; Chronic hypertension affecting pregnancy; Rubella non-immune status, antepartum; LGSIL on Pap smear of cervix; Supervision of other normal pregnancy, antepartum; Obesity affecting pregnancy in third trimester; Previous cesarean delivery affecting pregnancy; Rh negative state in antepartum period; and Unwanted fertility on their problem list.  Patient reports no complaints.  Contractions: Irregular. Vag. Bleeding: None.  Movement: Present. Denies leaking of fluid.   The following portions of the patient's history were reviewed and updated as appropriate: allergies, current medications, past family history, past medical history, past social history, past surgical history and problem list. Problem list updated.  Objective:   Vitals:   11/24/17 0932 11/24/17 1006  BP: (!) 147/95 (!) 143/84  Pulse: 91 89  Weight: 268 lb 4.8 oz (121.7 kg)     Fetal Status: Fetal Heart Rate (bpm): NST Fundal Height: 37 cm Movement: Present  Presentation: Vertex  General:  Alert, oriented and cooperative. Patient is in no acute distress.  Skin: Skin is warm and dry. No rash noted.   Cardiovascular: Normal heart rate noted  Respiratory: Normal respiratory effort, no problems with respiration noted  Abdomen: Soft, gravid, appropriate for gestational age.  Pain/Pressure: Present     Pelvic: Cervical exam performed Dilation: Closed Effacement (%): Thick Station: Ballotable  Extremities: Normal range of motion.  Edema: Trace  Mental Status: Normal mood and affect. Normal behavior. Normal judgment and thought content.   Assessment and Plan:  Pregnancy: G2P1001 at [redacted]w[redacted]d  1. Chronic hypertension  affecting pregnancy Patient is doing well. She denies HA, visual changes, RUQ/epigastric pain, nausea or emesis Elevated BP today- labs ordered NST reviewed and reactive with baseline 140, mod variability, +accels, no decels Follow up BPP today with MFM - Fetal nonstress test - CBC - Comp Met (CMET) - Protein / creatinine ratio, urine  2. Supervision of other normal pregnancy, antepartum Patient is doing well without complaints Cultures today - Strep Gp B NAA - Cervicovaginal ancillary only  3. Previous cesarean delivery affecting pregnancy Desires TOLAC  4. Rh negative state in antepartum period S/p rhogam  5. Rubella non-immune status, antepartum Will offer pp  Preterm labor symptoms and general obstetric precautions including but not limited to vaginal bleeding, contractions, leaking of fluid and fetal movement were reviewed in detail with the patient. Please refer to After Visit Summary for other counseling recommendations.  No follow-ups on file.  Future Appointments  Date Time Provider Department Center  12/01/2017  9:15 AM Constant, Peggy, MD [facility] None  12/01/2017 10:15 AM [facility] US 4 [facility]-M[facility]US MFC-US  12/08/2017  8:45 AM [facility] US 2 [facility]-M[facility]US MFC-US  12/08/2017 10:00 AM Ervin, Michael L, MD [facility] None  12/15/2017  8:45 AM [facility] US 2 WH-MFCUS MFC-US  12/15/2017  9:45 AM Constant, Peggy, MD [facility] None    Peggy Constant, MD  

## 2017-11-25 LAB — COMPREHENSIVE METABOLIC PANEL
ALK PHOS: 131 IU/L — AB (ref 39–117)
ALT: 5 IU/L (ref 0–32)
AST: 8 IU/L (ref 0–40)
Albumin/Globulin Ratio: 1.4 (ref 1.2–2.2)
Albumin: 3.8 g/dL (ref 3.5–5.5)
BUN/Creatinine Ratio: 8 — ABNORMAL LOW (ref 9–23)
BUN: 5 mg/dL — ABNORMAL LOW (ref 6–20)
Bilirubin Total: <0.2 mg/dL (ref 0.0–1.2)
CO2: 18 mmol/L — AB (ref 20–29)
CREATININE: 0.6 mg/dL (ref 0.57–1.00)
Calcium: 9.2 mg/dL (ref 8.7–10.2)
Chloride: 104 mmol/L (ref 96–106)
GFR calc Af Amer: 143 mL/min/{1.73_m2} (ref >59)
GFR calc non Af Amer: 124 mL/min/{1.73_m2} (ref >59)
GLOBULIN, TOTAL: 2.7 g/dL (ref 1.5–4.5)
GLUCOSE: 76 mg/dL (ref 65–99)
POTASSIUM: 4.4 mmol/L (ref 3.5–5.2)
SODIUM: 138 mmol/L (ref 134–144)
Total Protein: 6.5 g/dL (ref 6.0–8.5)

## 2017-11-25 LAB — CBC
HEMATOCRIT: 34.8 % (ref 34.0–46.6)
Hemoglobin: 11.3 g/dL (ref 11.1–15.9)
MCH: 25.7 pg — ABNORMAL LOW (ref 26.6–33.0)
MCHC: 32.5 g/dL (ref 31.5–35.7)
MCV: 79 fL (ref 79–97)
PLATELETS: 217 10*3/uL (ref 150–450)
RBC: 4.4 x10E6/uL (ref 3.77–5.28)
RDW: 16.4 % — AB (ref 12.3–15.4)
WBC: 8.4 10*3/uL (ref 3.4–10.8)

## 2017-11-25 LAB — PROTEIN / CREATININE RATIO, URINE
CREATININE, UR: 132.3 mg/dL
PROTEIN/CREAT RATIO: 181 mg/g{creat} (ref 0–200)
Protein, Ur: 24 mg/dL

## 2017-11-25 LAB — CERVICOVAGINAL ANCILLARY ONLY
CHLAMYDIA, DNA PROBE: NEGATIVE
NEISSERIA GONORRHEA: NEGATIVE

## 2017-11-26 LAB — STREP GP B NAA: Strep Gp B NAA: NEGATIVE

## 2017-12-01 ENCOUNTER — Encounter (HOSPITAL_COMMUNITY): Payer: Self-pay

## 2017-12-01 ENCOUNTER — Other Ambulatory Visit: Payer: Self-pay | Admitting: Obstetrics and Gynecology

## 2017-12-01 ENCOUNTER — Ambulatory Visit (INDEPENDENT_AMBULATORY_CARE_PROVIDER_SITE_OTHER): Payer: Medicaid Other | Admitting: Obstetrics and Gynecology

## 2017-12-01 ENCOUNTER — Ambulatory Visit (HOSPITAL_COMMUNITY)
Admission: RE | Admit: 2017-12-01 | Discharge: 2017-12-01 | Disposition: A | Discharge status: Home / Self Care | Payer: Medicaid Other | Source: Ambulatory Visit | Attending: Obstetrics and Gynecology | Admitting: Obstetrics and Gynecology

## 2017-12-01 ENCOUNTER — Encounter: Payer: Self-pay | Admitting: Obstetrics and Gynecology

## 2017-12-01 ENCOUNTER — Ambulatory Visit (HOSPITAL_BASED_OUTPATIENT_CLINIC_OR_DEPARTMENT_OTHER)
Admission: RE | Admit: 2017-12-01 | Discharge: 2017-12-01 | Disposition: A | Discharge status: Home / Self Care | Payer: Medicaid Other | Source: Ambulatory Visit | Attending: Obstetrics and Gynecology | Admitting: Obstetrics and Gynecology

## 2017-12-01 VITALS — BP 149/90 | HR 84 | Wt 271.0 lb

## 2017-12-01 VITALS — BP 137/84 | HR 76

## 2017-12-01 DIAGNOSIS — Z6841 Body Mass Index (BMI) 40.0 and over, adult: Secondary | ICD-10-CM

## 2017-12-01 DIAGNOSIS — O99210 Obesity complicating pregnancy, unspecified trimester: Secondary | ICD-10-CM

## 2017-12-01 DIAGNOSIS — O26899 Other specified pregnancy related conditions, unspecified trimester: Secondary | ICD-10-CM

## 2017-12-01 DIAGNOSIS — Z6791 Unspecified blood type, Rh negative: Secondary | ICD-10-CM

## 2017-12-01 DIAGNOSIS — O26893 Other specified pregnancy related conditions, third trimester: Secondary | ICD-10-CM

## 2017-12-01 DIAGNOSIS — Z364 Encounter for antenatal screening for fetal growth retardation: Secondary | ICD-10-CM | POA: Insufficient documentation

## 2017-12-01 DIAGNOSIS — O34219 Maternal care for unspecified type scar from previous cesarean delivery: Secondary | ICD-10-CM

## 2017-12-01 DIAGNOSIS — Z3009 Encounter for other general counseling and advice on contraception: Secondary | ICD-10-CM

## 2017-12-01 DIAGNOSIS — O10913 Unspecified pre-existing hypertension complicating pregnancy, third trimester: Secondary | ICD-10-CM

## 2017-12-01 DIAGNOSIS — O10919 Unspecified pre-existing hypertension complicating pregnancy, unspecified trimester: Secondary | ICD-10-CM

## 2017-12-01 DIAGNOSIS — O09899 Supervision of other high risk pregnancies, unspecified trimester: Secondary | ICD-10-CM

## 2017-12-01 DIAGNOSIS — Z3480 Encounter for supervision of other normal pregnancy, unspecified trimester: Secondary | ICD-10-CM

## 2017-12-01 DIAGNOSIS — Z3A37 37 weeks gestation of pregnancy: Secondary | ICD-10-CM

## 2017-12-01 DIAGNOSIS — O99213 Obesity complicating pregnancy, third trimester: Secondary | ICD-10-CM

## 2017-12-01 DIAGNOSIS — O9989 Other specified diseases and conditions complicating pregnancy, childbirth and the puerperium: Secondary | ICD-10-CM

## 2017-12-01 DIAGNOSIS — O10013 Pre-existing essential hypertension complicating pregnancy, third trimester: Secondary | ICD-10-CM

## 2017-12-01 DIAGNOSIS — Z283 Underimmunization status: Secondary | ICD-10-CM

## 2017-12-01 NOTE — Procedures (Signed)
Brittany Cordova Brittany Cordova May 31, 1990 5330w1d  Fetus A Non-Stress Test Interpretation for 12/01/17  Indication: Unsatisfactory BPP  Fetal Heart Rate A Mode: External Baseline Rate (A): 135 bpm Variability: Moderate Accelerations: 15 x 15 Decelerations: None Multiple birth?: No  Uterine Activity Mode: Palpation, Toco Contraction Frequency (min): UI Contraction Duration (sec): 10-20 Contraction Quality: Mild Resting Tone Palpated: Relaxed Resting Time: Adequate  Interpretation (Fetal Testing) Nonstress Test Interpretation: Reactive Comments: EFM tracing reviewed by Dr. Judeth CornfieldShankar

## 2017-12-01 NOTE — Progress Notes (Signed)
Received call from Dr. Judeth CornfieldShankar that patient is recommended for Induction given worsening HTN and BPP 8/10 today, however she declined induction today, but agreed to come tomorrow. Induction has been scheduled for tomorrow (7/16) at 7:30 am. I discussed this with the patient via telephone and strongly recommended she present tonight for induction, she again declines but states she will come in the morning. Reviewed precautions and strongly recommended she come to [facility] with any issues.   Baldemar LenisK. Meryl Davis, M.D. [facility]

## 2017-12-01 NOTE — Progress Notes (Signed)
   PRENATAL VISIT NOTE  Subjective:  Brittany Cordova is a 27 y.o. G2P1001 at 2759w1d being seen today for ongoing prenatal care.  She is currently monitored for the following issues for this high-risk pregnancy and has ANEMIA; ALLERGIC RHINITIS; ANGIOEDEMA; Pruritus; Obstructive sleep apnea; Rh negative, antepartum; Chronic hypertension affecting pregnancy; Rubella non-immune status, antepartum; LGSIL on Pap smear of cervix; Supervision of other normal pregnancy, antepartum; Obesity affecting pregnancy in third trimester; Previous cesarean delivery affecting pregnancy; Rh negative state in antepartum period; and Unwanted fertility on their problem list.  Patient reports no complaints.  Contractions: Irregular. Vag. Bleeding: None.  Movement: Present. Denies leaking of fluid.   The following portions of the patient's history were reviewed and updated as appropriate: allergies, current medications, past family history, past medical history, past social history, past surgical history and problem list. Problem list updated.  Objective:   Vitals:   12/01/17 0918  BP: (!) 149/90  Pulse: 84  Weight: 271 lb (122.9 kg)    Fetal Status: Fetal Heart Rate (bpm): NST   Movement: Present     General:  Alert, oriented and cooperative. Patient is in no acute distress.  Skin: Skin is warm and dry. No rash noted.   Cardiovascular: Normal heart rate noted  Respiratory: Normal respiratory effort, no problems with respiration noted  Abdomen: Soft, gravid, appropriate for gestational age.  Pain/Pressure: Present     Pelvic: Cervical exam deferred        Extremities: Normal range of motion.  Edema: Mild pitting, slight indentation  Mental Status: Normal mood and affect. Normal behavior. Normal judgment and thought content.   Assessment and Plan:  Pregnancy: G2P1001 at 3059w1d  1. Chronic hypertension affecting pregnancy Patient with elevated BP today in comparison to baseline. Normal labs last visit.  No si/sx of preeclampsia ( denies HA, visual changes, RUQ/epigastric pain) NST reviewed and non reactive with baseline 140, mod variability, no accels, no decels Discussed IOL at 39 weeks. Patient became upset as she states she was told IOL at 41 weeks. Reviewed diagnosis of CHTN and recent elevated BP over the past 2 visit and non reactive NST- Advised against IOL at 41 weeks for fetal well being Will continue to monitor closely Precautions reviewed BPP today - Fetal nonstress test  2. Supervision of other normal pregnancy, antepartum   3. Previous cesarean delivery affecting pregnancy Strongly desires TOLAC   4. Rh negative, antepartum S/p rhogam  5. Rubella non-immune status, antepartum Will offer pp  Term labor symptoms and general obstetric precautions including but not limited to vaginal bleeding, contractions, leaking of fluid and fetal movement were reviewed in detail with the patient. Please refer to After Visit Summary for other counseling recommendations.  No follow-ups on file.  Future Appointments  Date Time Provider Department Center  12/01/2017 10:15 AM [facility] US 4 [facility]-M[facility]US M[facility]-US  12/08/2017  8:45 AM WH-MFC US 2 WH-MFCUS MFC-US  12/08/2017 10:00 AM Hermina StaggersErvin, Michael L, MD [facility] None  12/14/2017  7:00 AM WH-BSSCHED ROOM [facility] None  12/15/2017  8:45 AM WH-MFC US 2 WH-MFCUS MFC-US  12/15/2017  9:45 AM Jaziah Kwasnik, Gigi GinPeggy, MD [facility] None    Catalina AntiguaPeggy Deairra Halleck, MD

## 2017-12-01 NOTE — Progress Notes (Signed)
NST DX Chronic Hypertension

## 2017-12-02 ENCOUNTER — Inpatient Hospital Stay (HOSPITAL_COMMUNITY)
Admission: RE | Admit: 2017-12-02 | Discharge: 2017-12-06 | DRG: 806 | Disposition: A | Discharge status: Home / Self Care | Payer: Medicaid Other | Attending: Obstetrics & Gynecology | Admitting: Obstetrics & Gynecology

## 2017-12-02 ENCOUNTER — Other Ambulatory Visit: Payer: Self-pay

## 2017-12-02 ENCOUNTER — Encounter (HOSPITAL_COMMUNITY): Payer: Self-pay

## 2017-12-02 DIAGNOSIS — O9902 Anemia complicating childbirth: Secondary | ICD-10-CM | POA: Diagnosis present

## 2017-12-02 DIAGNOSIS — O365930 Maternal care for other known or suspected poor fetal growth, third trimester, not applicable or unspecified: Secondary | ICD-10-CM | POA: Diagnosis present

## 2017-12-02 DIAGNOSIS — Z3A37 37 weeks gestation of pregnancy: Secondary | ICD-10-CM | POA: Diagnosis not present

## 2017-12-02 DIAGNOSIS — I10 Essential (primary) hypertension: Secondary | ICD-10-CM | POA: Insufficient documentation

## 2017-12-02 DIAGNOSIS — O1002 Pre-existing essential hypertension complicating childbirth: Secondary | ICD-10-CM | POA: Diagnosis present

## 2017-12-02 DIAGNOSIS — Z6791 Unspecified blood type, Rh negative: Secondary | ICD-10-CM | POA: Diagnosis not present

## 2017-12-02 DIAGNOSIS — R87612 Low grade squamous intraepithelial lesion on cytologic smear of cervix (LGSIL): Secondary | ICD-10-CM | POA: Diagnosis present

## 2017-12-02 DIAGNOSIS — O99214 Obesity complicating childbirth: Secondary | ICD-10-CM | POA: Diagnosis present

## 2017-12-02 DIAGNOSIS — Z87891 Personal history of nicotine dependence: Secondary | ICD-10-CM | POA: Diagnosis not present

## 2017-12-02 DIAGNOSIS — E669 Obesity, unspecified: Secondary | ICD-10-CM | POA: Diagnosis present

## 2017-12-02 DIAGNOSIS — D649 Anemia, unspecified: Secondary | ICD-10-CM | POA: Diagnosis present

## 2017-12-02 DIAGNOSIS — O34219 Maternal care for unspecified type scar from previous cesarean delivery: Secondary | ICD-10-CM | POA: Diagnosis present

## 2017-12-02 DIAGNOSIS — O26899 Other specified pregnancy related conditions, unspecified trimester: Secondary | ICD-10-CM

## 2017-12-02 DIAGNOSIS — O9989 Other specified diseases and conditions complicating pregnancy, childbirth and the puerperium: Secondary | ICD-10-CM

## 2017-12-02 DIAGNOSIS — Z2839 Other underimmunization status: Secondary | ICD-10-CM

## 2017-12-02 DIAGNOSIS — Z3480 Encounter for supervision of other normal pregnancy, unspecified trimester: Secondary | ICD-10-CM

## 2017-12-02 DIAGNOSIS — O4103X0 Oligohydramnios, third trimester, not applicable or unspecified: Secondary | ICD-10-CM | POA: Diagnosis present

## 2017-12-02 DIAGNOSIS — O365990 Maternal care for other known or suspected poor fetal growth, unspecified trimester, not applicable or unspecified: Secondary | ICD-10-CM | POA: Diagnosis present

## 2017-12-02 DIAGNOSIS — Z283 Underimmunization status: Secondary | ICD-10-CM

## 2017-12-02 DIAGNOSIS — Z3009 Encounter for other general counseling and advice on contraception: Secondary | ICD-10-CM

## 2017-12-02 DIAGNOSIS — O99213 Obesity complicating pregnancy, third trimester: Secondary | ICD-10-CM | POA: Diagnosis present

## 2017-12-02 DIAGNOSIS — O10919 Unspecified pre-existing hypertension complicating pregnancy, unspecified trimester: Secondary | ICD-10-CM | POA: Diagnosis present

## 2017-12-02 DIAGNOSIS — O26893 Other specified pregnancy related conditions, third trimester: Secondary | ICD-10-CM | POA: Diagnosis present

## 2017-12-02 HISTORY — DX: Obstructive sleep apnea (adult) (pediatric): G47.33

## 2017-12-02 HISTORY — DX: Vitamin D deficiency, unspecified: E55.9

## 2017-12-02 LAB — CBC
HEMATOCRIT: 34.5 % — AB (ref 36.0–46.0)
HEMOGLOBIN: 11.2 g/dL — AB (ref 12.0–15.0)
MCH: 26 pg (ref 26.0–34.0)
MCHC: 32.5 g/dL (ref 30.0–36.0)
MCV: 80 fL (ref 78.0–100.0)
Platelets: 207 10*3/uL (ref 150–400)
RBC: 4.31 MIL/uL (ref 3.87–5.11)
RDW: 15.7 % — ABNORMAL HIGH (ref 11.5–15.5)
WBC: 8.3 10*3/uL (ref 4.0–10.5)

## 2017-12-02 LAB — COMPREHENSIVE METABOLIC PANEL
ALBUMIN: 2.9 g/dL — AB (ref 3.5–5.0)
ALK PHOS: 128 U/L — AB (ref 38–126)
ALT: 9 U/L (ref 0–44)
ANION GAP: 10 (ref 5–15)
AST: 17 U/L (ref 15–41)
BUN: 10 mg/dL (ref 6–20)
CALCIUM: 8.9 mg/dL (ref 8.9–10.3)
CO2: 18 mmol/L — AB (ref 22–32)
Chloride: 107 mmol/L (ref 98–111)
Creatinine, Ser: 0.51 mg/dL (ref 0.44–1.00)
GFR calc non Af Amer: >60 mL/min (ref >60)
Glucose, Bld: 120 mg/dL — ABNORMAL HIGH (ref 70–99)
POTASSIUM: 3.9 mmol/L (ref 3.5–5.1)
Sodium: 135 mmol/L (ref 135–145)
Total Bilirubin: 0.3 mg/dL (ref 0.3–1.2)
Total Protein: 6.6 g/dL (ref 6.5–8.1)

## 2017-12-02 LAB — TYPE AND SCREEN
ABO/RH(D): O NEG
ANTIBODY SCREEN: NEGATIVE

## 2017-12-02 LAB — RPR: RPR Ser Ql: NONREACTIVE

## 2017-12-02 MED ORDER — LACTATED RINGERS IV SOLN: 500.0000 mL | INTRAVENOUS | Status: DC | PRN

## 2017-12-02 MED ORDER — OXYTOCIN 40 UNITS IN LACTATED RINGERS INFUSION - SIMPLE MED
1.0000 m[IU]/min | INTRAVENOUS | Status: DC
  Administered 2017-12-02: 1 m[IU]/min via INTRAVENOUS
  Administered 2017-12-04: 20 m[IU]/min via INTRAVENOUS
  Filled 2017-12-02: qty 1000

## 2017-12-02 MED ORDER — SOD CITRATE-CITRIC ACID 500-334 MG/5ML PO SOLN: 30.0000 mL | ORAL | Status: DC | PRN

## 2017-12-02 MED ORDER — OXYTOCIN 40 UNITS IN LACTATED RINGERS INFUSION - SIMPLE MED
1.0000 m[IU]/min | INTRAVENOUS | Status: DC
  Filled 2017-12-02: qty 1000

## 2017-12-02 MED ORDER — OXYTOCIN 40 UNITS IN LACTATED RINGERS INFUSION - SIMPLE MED
2.5000 [IU]/h | INTRAVENOUS | Status: DC
  Administered 2017-12-04: 2.5 [IU]/h via INTRAVENOUS
  Filled 2017-12-02: qty 1000

## 2017-12-02 MED ORDER — FENTANYL CITRATE (PF) 100 MCG/2ML IJ SOLN
100.0000 ug | INTRAMUSCULAR | Status: DC | PRN
  Administered 2017-12-02 – 2017-12-04 (×6): 100 ug via INTRAVENOUS
  Filled 2017-12-02 (×7): qty 2

## 2017-12-02 MED ORDER — LIDOCAINE HCL (PF) 1 % IJ SOLN
30.0000 mL | INTRAMUSCULAR | Status: DC | PRN
  Filled 2017-12-02: qty 30

## 2017-12-02 MED ORDER — OXYTOCIN BOLUS FROM INFUSION
500.0000 mL | Freq: Once | INTRAVENOUS | Status: AC
  Administered 2017-12-04: 500 mL via INTRAVENOUS

## 2017-12-02 MED ORDER — LACTATED RINGERS IV SOLN
INTRAVENOUS | Status: DC
  Administered 2017-12-02 – 2017-12-04 (×8): via INTRAVENOUS

## 2017-12-02 MED ORDER — TERBUTALINE SULFATE 1 MG/ML IJ SOLN
0.2500 mg | Freq: Once | INTRAMUSCULAR | Status: DC | PRN
  Filled 2017-12-02: qty 1

## 2017-12-02 MED ORDER — ACETAMINOPHEN 325 MG PO TABS
650.0000 mg | ORAL_TABLET | ORAL | Status: DC | PRN
  Administered 2017-12-04: 650 mg via ORAL
  Filled 2017-12-02: qty 2

## 2017-12-02 MED ORDER — ONDANSETRON HCL 4 MG/2ML IJ SOLN
4.0000 mg | Freq: Four times a day (QID) | INTRAMUSCULAR | Status: DC | PRN
  Administered 2017-12-02: 4 mg via INTRAVENOUS
  Filled 2017-12-02: qty 2

## 2017-12-02 NOTE — H&P (Addendum)
OBSTETRIC ADMISSION HISTORY AND PHYSICAL  Brittany Cordova is a 27 y.o. female G2P1001 with IUP at [redacted]w[redacted]d by LMP presenting for IOL for uncontrolled cHTN and recent BPP 8/10 with boderline oligohydramnios. She states she has had high blood pressure for the past 7 years that she controlled with diet and exercise and that her BP were normal during this pregnancy except for the most recent one. She reports +FMs, a mild amount of clear discharge approximately a week ago when wiping after using the bathroom. no VB, no blurry vision, no headaches or  peripheral edema, and RUQ pain.  She plans on breast feeding. She request either a BTL or pp IUD for birth control. She received her prenatal care at [facility]:   @[redacted]w[redacted]d , CWD, normal anatomy, cephalicpresentation, , 2208g, <10% EFW  Prenatal History/Complications:  Prenatal care at [facility] starting at 15 weeks Complications: - CHTN - H/o asthma (well-controlled) - Rh negative - OSA - Rubella non-immune  Past Medical History: Past Medical History:  Diagnosis Date  . Allergic rhinitis, cause unspecified   . 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 Amenorrhea    on BC hormone inj  . Anemia, unspecified   . 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 Asthma    Heat induced  . Dermatitis due to food taken internally   . Hypertension   . Mass    heart  . Obstructive sleep apnea   . Ovarian cyst   . Vitamin D deficiency     Past Surgical History: Past Surgical History:  Procedure Laterality Date  . CESAREAN SECTION N/A 05/08/2016   Procedure: CESAREAN SECTION;  Surgeon: Federico Flake, MD;  Location: [facility] BIRTHING SUITES;  Service: Obstetrics;  Laterality: N/A;  . WISDOM TOOTH EXTRACTION      Obstetrical History: OB History    Gravida  2   Para  1   Term  1   Preterm  0   AB  0   Living  1     SAB  0   TAB  0   Ectopic  0   Multiple  0   Live Births  1           Social History: Social History   Socioeconomic  History  . Marital status: Single    Spouse name: Not on file  . Number of children: Not on file  . Years of education: Not on file  . Highest education level: Not on file  Occupational History  . Occupation: Designer, industrial/product: UNEMPLOYED  Social Needs  . Financial resource strain: Not on file  . Food insecurity:    Worry: Not on file    Inability: Not on file  . Transportation needs:    Medical: Not on file    Non-medical: Not on file  Tobacco Use  . Smoking status: Former Smoker    Packs/day: 0.25    Years: 3.00    Pack years: 0.75    Types: Cigars    Last attempt to quit: 09/18/2015    Years since quitting: 2.2  . Smokeless tobacco: Never Used  Substance and Sexual Activity  . Alcohol use: No  . Drug use: Not Currently    Types: Marijuana    Comment: not since found out preg  . Sexual activity: Yes    Birth control/protection: None  Lifestyle  . Physical activity:    Days per week: Not on file    Minutes per session:  Not on file  . Stress: Not on file  Relationships  . Social connections:    Talks on phone: Not on file    Gets together: Not on file    Attends religious service: Not on file    Active member of club or organization: Not on file    Attends meetings of clubs or organizations: Not on file    Relationship status: Not on file  Other Topics Concern  . Not on file  Social History Narrative  . Not on file    Family History: Family History  Problem Relation Age of Onset  . Allergies Father        tomatoes  . Hypertension Father   . Asthma Mother   . Hypertension Mother   . Diabetes Mother   . Diabetes Other        grandmother  . Cancer Paternal Grandmother   . Diabetes Paternal Grandmother   . Cancer Maternal Uncle        [employer] Uncle  . Cancer Other        great aunts  . Diabetes Maternal Grandmother   . Heart disease Maternal Grandfather     Allergies: Allergies  Allergen Reactions  . Ibuprofen Anaphylaxis and Swelling  .  Tomato Swelling    Mouth/ lip swelling     Medications Prior to Admission  Medication Sig Dispense Refill Last Dose  . Cholecalciferol (VITAMIN D) 2000 units CAPS Take 1 capsule (2,000 Units total) by mouth daily before breakfast. 30 capsule 11 12/01/2017 at Unknown time  . Prenatal Vit-Fe Phos-FA-Omega (VITAFOL GUMMIES) 3.33-0.333-34.8 MG CHEW Chew 3 tablets by mouth daily before breakfast. 90 tablet 11 12/01/2017 at Unknown time     Review of Systems  All systems reviewed and negative except as stated in HPI  Physical Exam Blood pressure 138/78, pulse 69, temperature 98.2 F (36.8 C), temperature source Oral, resp. rate 18, height 5\' 3"  (1.6 m), weight 122.9 kg (271 lb), last menstrual period 03/16/2017, currently breastfeeding. General appearance: alert, cooperative, appears stated age and no distress Lungs: clear to auscultation bilaterally Heart: regular rate and rhythm Abdomen: soft, non-tender; bowel sounds normal Extremities: Homans sign is negative, no sign of DVT Presentation: cephalic Fetal monitoringBaseline: 140 bpm, Variability: Good {> 6 bpm), Accelerations: Reactive and Decelerations: Absent Uterine activity: occasional contractions Dilation: Fingertip Effacement (%): Thick Exam by:: Marcelino DusterMichelle, RN   Prenatal labs: ABO, Rh: O/Negative/-- (02/11 1610) Antibody: Negative (02/11 1610) Rubella: <0.90 (02/11 1610) RPR: Non Reactive (05/23 1018)  HBsAg: Negative (02/11 1610)  HIV: Non Reactive (05/23 1018)  GBS: Negative (07/08 0951)  GTT: 71/130/88 Genetic screening  normal Anatomy US normal  Prenatal Transfer Tool  Maternal Diabetes: No Genetic Screening: Normal Maternal Ultrasounds/Referrals: Normal Fetal Ultrasounds or other Referrals:  Referred to Materal Fetal Medicine  Maternal Substance Abuse:  No Significant Maternal Medications:  None Significant Maternal Lab Results: Lab values include: Group B Strep negative  Results for orders placed or performed  during the hospital encounter of 12/02/17 (from the past 24 hour(s))  CBC   Collection Time: 12/02/17  8:15 AM  Result Value Ref Range   WBC 8.3 4.0 - 10.5 K/uL   RBC 4.31 3.87 - 5.11 MIL/uL   Hemoglobin 11.2 (L) 12.0 - 15.0 g/dL   HCT 40.934.5 (L) 81.136.0 - 91.446.0 %   MCV 80.0 78.0 - 100.0 fL   MCH 26.0 26.0 - 34.0 pg   MCHC 32.5 30.0 - 36.0 g/dL   RDW 78.215.7 (H)  11.5 - 15.5 %   Platelets 207 150 - 400 K/uL  Comprehensive metabolic panel   Collection Time: 12/02/17  8:15 AM  Result Value Ref Range   Sodium 135 135 - 145 mmol/L   Potassium 3.9 3.5 - 5.1 mmol/L   Chloride 107 98 - 111 mmol/L   CO2 18 (L) 22 - 32 mmol/L   Glucose, Bld 120 (H) 70 - 99 mg/dL   BUN 10 6 - 20 mg/dL   Creatinine, Ser 1.61 0.44 - 1.00 mg/dL   Calcium 8.9 8.9 - 09.6 mg/dL   Total Protein 6.6 6.5 - 8.1 g/dL   Albumin 2.9 (L) 3.5 - 5.0 g/dL   AST 17 15 - 41 U/L   ALT 9 0 - 44 U/L   Alkaline Phosphatase 128 (H) 38 - 126 U/L   Total Bilirubin 0.3 0.3 - 1.2 mg/dL   GFR calc non Af Amer >60 >60 mL/min   GFR calc Af Amer >60 >60 mL/min   Anion gap 10 5 - 15    Patient Active Problem List   Diagnosis Date Noted  . Hypertension 12/02/2017  . Unwanted fertility 11/10/2017  . Obesity affecting pregnancy in third trimester   . Previous cesarean delivery affecting pregnancy   . Rh negative state in antepartum period   . Supervision of other normal pregnancy, antepartum 06/30/2017  . LGSIL on Pap smear of cervix 05/27/2016  . Chronic hypertension affecting pregnancy 04/03/2016  . Rubella non-immune status, antepartum 04/03/2016  . Rh negative, antepartum 02/14/2016  . Obstructive sleep apnea 11/01/2010  . Pruritus 09/30/2010  . ALLERGIC RHINITIS 06/12/2008  . ANEMIA 05/31/2008  . ANGIOEDEMA 05/31/2008    Assessment/Plan:  Kadince Boxley is a 27 y.o. G2P1001 at [redacted]w[redacted]d here for IOL for cHTN after recent elevated BP reading and BPP of 8/10.     #Labor:IOL.  NO cytotec due to TOLAC.  Start pitocin.  Foley  bulb when able to place.  Rupture of membrane as patient progresses in active labor.   #Rubella nonimmune:  Will need ppMMR.  #Rh negative: patient will need Rhogam after delivery.   #Pain: epidural #FWB: Cat I #ID:  GBS neg #MOF: breast #MOC:BTL or ppIUD.  Signed paper on 6/24 (<30 days) #Circ:  Yes.  Outpatient.    Sandre Kitty, MD  12/02/2017, 10:25 AM  OB FELLOW HISTORY AND PHYSICAL ATTESTATION I have seen and examined this patient; I agree with above documentation in the resident's note. Modified as appropriate.   Frederik Pear, MD OB Fellow 12/02/2017

## 2017-12-02 NOTE — Progress Notes (Signed)
Subjective: Brittany Cordova is a 27 y.o. G2P1001 at 3435w2d by LMP admitted for induction of labor due to borderline oligohydramnios and cHTN. She has a past medical history significant for Rh negative, OSA, and rubella non-immunity. Patient is currently feeling well and was able to shower and eat.     Objective: BP 131/85   Pulse 78   Temp 98.1 F (36.7 C) (Oral)   Resp 16   Ht 5\' 3"  (1.6 m)   Wt 122.9 kg (271 lb)   LMP 03/16/2017 (Approximate)   BMI 48.01 kg/m  I/O last 3 completed shifts: In: 1415.6 [P.O.:240; I.V.:1175.6] Out: -  No intake/output data recorded.  FHT:  FHR: 145 bpm, variability: moderate,  accelerations:  Present,  decelerations:  Absent UC:   none SVE:   Dilation: 1 Effacement (%): Thick Station: Ballotable Exam by:: Joellyn HaffKim Booker, CNM Sonography performed verifying fetal positioning as vertex.  Labs: Lab Results  Component Value Date   WBC 8.3 12/02/2017   HGB 11.2 (L) 12/02/2017   HCT 34.5 (L) 12/02/2017   MCV 80.0 12/02/2017   PLT 207 12/02/2017    Assessment / Plan: Induction of labor due to borderline oligohydramnios and uncontrolled cHTN, on Pitocin  Labor: Progressing on Pitocin, it was resumed after she took a shower and ate Preeclampsia: None, uncontrolled cHTN   Fetal Wellbeing:  Category I Pain Control:  Labor support without medications I/D:  n/a Anticipated MOD:  VBAC, patient is a TOLAC Patient plans to breastfeed and is deciding between BLT vs. IUD for contraception  Candie MileLindsey R Koi Yarbro 12/02/2017, 9:23 PM

## 2017-12-02 NOTE — Progress Notes (Signed)
LABOR PROGRESS NOTE  Brittany Cordova, a 27 y.o. G2P1001 at 5887w2d, pmh s/o asthma and  OSA, was admitted for IOL for  Uncontrolled cHTN,   Subjective:  Pt denies problems with ambulating, voiding or po intake (but pt is currently on a liquid only diet).  She denies nausea or vomiting.  Pain is well controlled. Pt denies bleeding and any leaking of fluid today.  Pt denies visual changes/blurry vision.  Pt denies RUQ pain.  Pt reports flatulence as well as having a bowel movement since arriving in the hospital.  Pt denies having trouble urinating.  Pt denies fever/chills.   Objective: Patient Vitals for the past 24 hrs:  BP Temp Temp src Pulse Resp Height Weight  12/02/17 1445 135/80 - - 66 18 - -  12/02/17 1418 (!) 158/82 - - 69 18 - -  12/02/17 1157 (!) 141/79 98.2 F (36.8 C) Oral 60 18 - -  12/02/17 1038 136/74 - - 76 18 - -  12/02/17 1011 138/78 - - 69 18 - -  12/02/17 0937 (!) 149/77 - - 65 18 - -  12/02/17 0905 136/81 - - 73 18 - -  12/02/17 0805 - - - - - 5\' 3"  (1.6 m) 122.9 kg (271 lb)  12/02/17 0748 137/77 98.2 F (36.8 C) Oral 88 18 - -   LMP 03/16/2017 (Approximate)   BMI 48.01 kg/m      Last Cervical Exam 2:23pm Dilation: Fingertip Effacement (%): Thick Station: Ballotable Presentation: Vertex(by ultrasound) Exam by:: dr Nira Retortegele    FHT: baseline rate 135bpm, moderate variability (generally between 6-25bpm), acel  PRESENT (15bpm increase for 15second), decel NONE  Pitocin: 255mU/min  Toco: NONE   Patient Active Problem List   Diagnosis Date Noted  . Hypertension 12/02/2017  . Unwanted fertility 11/10/2017  . Obesity affecting pregnancy in third trimester   . Previous cesarean delivery affecting pregnancy   . Supervision of other normal pregnancy, antepartum 06/30/2017  . LGSIL on Pap smear of cervix 05/27/2016  . Chronic hypertension affecting pregnancy 04/03/2016  . Rubella non-immune status, antepartum 04/03/2016  . Rh negative, antepartum  02/14/2016  . Obstructive sleep apnea 11/01/2010    Assessment / Plan: 27 y.o. G2P1001 at 7387w2d here for for IOL for  Uncontrolled cHTN,    Labor: Stage 1: Latent Phase (membranes have not been ruptured, and contractions are pacing about every 3-5 minutes; cervix is dilated 0.5cm and thick) Fetal Wellbeing:  Category 1 FHT (reassuring) Pain Control:  Pt denies discomfort, and pain is not being managed pharacologically Anticipated MOD:  VBAC (ToLAC)  Pt is undecided about mode of contraception (had discussions about post partum IUD; BTL papers signed <30 days ago)   Ronnie DossChika I Haedyn Ancrum, MS3 12/02/2017, 3:21 PM

## 2017-12-02 NOTE — Anesthesia Pain Management Evaluation Note (Signed)
  CRNA Pain Management Visit Note  Patient: Brittany Cordova, 27 y.o., female  "Hello I am a [facility] of the anesthesia [facility]. We have an anesthesia team available at all times to provide care throughout the hospital, including epidural management and anesthesia for C-section. I don't know your plan for the delivery whether it a natural birth, water birth, IV sedation, nitrous supplementation, doula or epidural, but we want to meet your pain goals."   1.Was your pain managed to your expectations on prior hospitalizations?   Yes   2.What is your expectation for pain management during this hospitalization?     IV pain meds and Nitrous Oxide  3.How can we help you reach that goal? Pain meds as requested  Record the patient's initial score and the patient's pain goal.   Pain: 0  Pain Goal: 8 [facility] wants you to be able to say your pain was always managed very well.  Cleda ClarksBrowder, Candise Crabtree R 12/02/2017

## 2017-12-02 NOTE — Progress Notes (Signed)
LABOR PROGRESS NOTE  Caprice Homero Fellersicole Figuero is a 27 y.o. G2P1001 at 6316w2d w. A pmh s/o asthma and OSA, and  admitted for IOL for uncontrolled cHTN.   Subjective: Pt reports fetal movement but denies "feeling contractions."  Pt  denies problems with ambulating, headache, blurry vision/changes in vision and RUQ pain. Pt denies loss of fluids or bleeding.  Pt endorses minimal abdominal discomfort.  Pt denies fever/chills.   Objective: Patient Vitals for the past 24 hrs:  BP Temp Temp src Pulse Resp Height Weight  12/02/17 1816 132/88 - - 69 18 - -  12/02/17 1700 (!) 152/87 - - 78 18 - -  12/02/17 1652 (!) 151/100 98.2 F (36.8 C) Oral 92 18 - -  12/02/17 1552 (!) 143/76 - - 71 18 - -  12/02/17 1445 135/80 - - 66 18 - -  12/02/17 1418 (!) 158/82 - - 69 18 - -  12/02/17 1157 (!) 141/79 98.2 F (36.8 C) Oral 60 18 - -  12/02/17 1038 136/74 - - 76 18 - -  12/02/17 1011 138/78 - - 69 18 - -  12/02/17 0937 (!) 149/77 - - 65 18 - -  12/02/17 0905 136/81 - - 73 18 - -  12/02/17 0805 - - - - - 5\' 3"  (1.6 m) 122.9 kg (271 lb)  12/02/17 0748 137/77 98.2 F (36.8 C) Oral 88 18 - -   LMP 03/16/2017 (Approximate)   BMI 48.01 kg/m   Cervical Exam Dilation: 1 Effacement (%): Thick Cervical Position: Posterior Station: Ballotable Presentation: Vertex Exam by:: Dr Nira Retortegele  FHT: baseline rate 135bpm, moderate variability, PRESENT acel (15bpm increase for at least 15 seconds), variable decel present Toco:  Contractions are approx every 1-4 minutes on pitocin 218mU/min and occurring irregularly during Pitocin break   Patient Active Problem List   Diagnosis Date Noted  . Hypertension 12/02/2017  . Unwanted fertility 11/10/2017  . Obesity affecting pregnancy in third trimester   . Previous cesarean delivery affecting pregnancy   . Supervision of other normal pregnancy, antepartum 06/30/2017  . LGSIL on Pap smear of cervix 05/27/2016  . Chronic hypertension affecting pregnancy 04/03/2016  .  Rubella non-immune status, antepartum 04/03/2016  . Rh negative, antepartum 02/14/2016  . Obstructive sleep apnea 11/01/2010    Assessment / Plan: 27 y.o. G2P1001 at 9716w2d here for IOL for uncontrolled cHTN  Labor: Stage : Latent Phase (cervix is dilated to  about 1cm, and thick); 1st attempt at placing FB (approx 2:30pm) was unsuccessful; successful 2nd attempt @ 6:40pm. Was up to Pitocin  648mU/min.  Pt placed on Pitocin break to eat and shower.  Plan to resume Pitocin @ 701mU/min;  Fetal Wellbeing:  Category 2 FHT;  Pain Control:  Pt reports minimal discomfort immediately after placement of  FB, and pain is not being managed pharmacologically  Anticipated MOD:  VBAC (ToLAC)  Mercedes Valeriano I Danajah Birdsell, MS3 12/02/2017, 7:00 PM

## 2017-12-02 NOTE — Progress Notes (Addendum)
Patient ID: Brittany Cordova, female   DOB: 07/05/1990, 27 y.o.   MRN: [id number] Brittany Cordova is a 27 y.o. G2P1001 at 6469w2d admitted for induction of labor due to borderline oligo, CHTN. [facility].   Subjective: Doing well, just got out of shower, had supper, ready to start pitocin back.  Objective: BP 140/84   Pulse 89   Temp 98.1 F (36.7 C) (Oral)   Resp 16   Ht 5\' 3"  (1.6 m)   Wt 122.9 kg (271 lb)   LMP 03/16/2017 (Approximate)   BMI 48.01 kg/m  No intake/output data recorded.  FHT:  FHR: 145 bpm, variability: moderate,  accelerations:  Present,  decelerations:  Absent UC:   ui  SVE:   1/th/ballotable by Dr. Nira Retortegele @ [phone] w/ foley bulb placement  Labs: Lab Results  Component Value Date   WBC 8.3 12/02/2017   HGB 11.2 (L) 12/02/2017   HCT 34.5 (L) 12/02/2017   MCV 80.0 12/02/2017   PLT 207 12/02/2017    Assessment / Plan: IOL d/t borderline oligo, CHTN. [facility] for [facility]. Foley bulb placed at 1830, pit was turned off, allowed to eat supper and get a shower, now will restart pit to max of 236mu/min while foley bulb in d/t prior c/s.   Labor: ripening phase Fetal Wellbeing:  Category I Pain Control:  n/a Pre-eclampsia: CHTN, bp's stable I/D:  n/a Anticipated MOD:  VBAC  [name] CNM, WHNP-BC 12/02/2017, 2110

## 2017-12-03 LAB — CBC WITH DIFFERENTIAL/PLATELET
BASOS ABS: 0 10*3/uL (ref 0.0–0.1)
BASOS PCT: 0 %
EOS ABS: 0.1 10*3/uL (ref 0.0–0.7)
Eosinophils Relative: 1 %
HCT: 33.3 % — ABNORMAL LOW (ref 36.0–46.0)
HEMOGLOBIN: 10.8 g/dL — AB (ref 12.0–15.0)
Lymphocytes Relative: 26 %
Lymphs Abs: 2.2 10*3/uL (ref 0.7–4.0)
MCH: 25.9 pg — ABNORMAL LOW (ref 26.0–34.0)
MCHC: 32.4 g/dL (ref 30.0–36.0)
MCV: 79.9 fL (ref 78.0–100.0)
MONOS PCT: 3 %
Monocytes Absolute: 0.3 10*3/uL (ref 0.1–1.0)
NEUTROS PCT: 70 %
Neutro Abs: 5.9 10*3/uL (ref 1.7–7.7)
Platelets: 195 10*3/uL (ref 150–400)
RBC: 4.17 MIL/uL (ref 3.87–5.11)
RDW: 15.9 % — ABNORMAL HIGH (ref 11.5–15.5)
WBC: 8.5 10*3/uL (ref 4.0–10.5)

## 2017-12-03 LAB — CBC
HCT: 32.2 % — ABNORMAL LOW (ref 36.0–46.0)
Hemoglobin: 10.6 g/dL — ABNORMAL LOW (ref 12.0–15.0)
MCH: 26.1 pg (ref 26.0–34.0)
MCHC: 32.9 g/dL (ref 30.0–36.0)
MCV: 79.3 fL (ref 78.0–100.0)
Platelets: 200 10*3/uL (ref 150–400)
RBC: 4.06 MIL/uL (ref 3.87–5.11)
RDW: 15.7 % — AB (ref 11.5–15.5)
WBC: 9 10*3/uL (ref 4.0–10.5)

## 2017-12-03 MED ORDER — FENTANYL 2.5 MCG/ML BUPIVACAINE 1/10 % EPIDURAL INFUSION (WH - ANES)
14.0000 mL/h | INTRAMUSCULAR | Status: DC | PRN
  Administered 2017-12-04 (×2): 14 mL/h via EPIDURAL
  Filled 2017-12-03 (×2): qty 100

## 2017-12-03 MED ORDER — PHENYLEPHRINE 40 MCG/ML (10ML) SYRINGE FOR IV PUSH (FOR BLOOD PRESSURE SUPPORT)
80.0000 ug | PREFILLED_SYRINGE | INTRAVENOUS | Status: DC | PRN
  Filled 2017-12-03: qty 5

## 2017-12-03 MED ORDER — PHENYLEPHRINE 40 MCG/ML (10ML) SYRINGE FOR IV PUSH (FOR BLOOD PRESSURE SUPPORT)
80.0000 ug | PREFILLED_SYRINGE | INTRAVENOUS | Status: DC | PRN
  Filled 2017-12-03: qty 10

## 2017-12-03 MED ORDER — EPHEDRINE 5 MG/ML INJ: 10.0000 mg | INTRAVENOUS | Status: DC | PRN

## 2017-12-03 MED ORDER — DIPHENHYDRAMINE HCL 50 MG/ML IJ SOLN: 12.5000 mg | INTRAMUSCULAR | Status: DC | PRN

## 2017-12-03 MED ORDER — EPHEDRINE 5 MG/ML INJ
10.0000 mg | INTRAVENOUS | Status: DC | PRN
  Filled 2017-12-03: qty 2

## 2017-12-03 MED ORDER — LACTATED RINGERS IV SOLN
500.0000 mL | Freq: Once | INTRAVENOUS | Status: AC
  Administered 2017-12-04: 500 mL via INTRAVENOUS

## 2017-12-03 NOTE — Progress Notes (Signed)
Eating regular diet 

## 2017-12-03 NOTE — Progress Notes (Addendum)
LABOR PROGRESS NOTE  Brittany Cordova is a 27 y.o. G2P1001 at [redacted]w[redacted]d  w pmh s./o asthma, and OSA, and is admitted for IOL for  uncontrolled cHTN, FGR, and oligohydramnios.    Subjective: Pt denies headache, blurry vision, changes of vision. Pt denies RUQ pain. Pt denies trouble ambulating or peeing.  Pt reports spotting that is visible when she wipes and abdominal discomfort that peaks with contractions.   Objective: Patient Vitals for the past 24 hrs:  BP Temp Temp src Pulse Resp  12/03/17 1532 107/80 - - 76 18  12/03/17 1502 114/66 - - 68 18  12/03/17 1432 114/61 - - 72 -  12/03/17 1428 - 99.1 F (37.3 C) Oral - -  12/03/17 1403 125/72 - - 83 -  12/03/17 1332 116/76 - - 74 -  12/03/17 1317 114/64 - - 72 -  12/03/17 1230 119/70 - - 65 16  12/03/17 1100 129/70 - - 70 16  12/03/17 1032 130/82 - - 67 16  12/03/17 1002 114/73 - - 64 16  12/03/17 0946 114/76 - - 71 16  12/03/17 0900 120/70 - - 60 15  12/03/17 0802 118/74 - - 67 16  12/03/17 0732 105/62 98.1 F (36.7 C) Oral 77 15  12/03/17 0702 116/78 - - 73 -  12/03/17 0632 118/77 - - 69 -  12/03/17 0602 113/69 - - 83 -  12/03/17 0532 (!) 108/56 - - 78 -  12/03/17 0502 107/60 98.3 F (36.8 C) Oral 73 16  12/03/17 0402 125/81 - - 68 17  12/03/17 0332 (!) 147/89 - - 70 -  12/03/17 0200 - - - - 16  12/03/17 0132 105/63 - - 79 -  12/03/17 0102 (!) 112/53 98.2 F (36.8 C) Oral 70 -  12/03/17 0010 134/80 - - 69 -  12/03/17 0002 (!) 154/106 - - 74 20  12/02/17 2331 (!) 149/102 - - 88 -  12/02/17 2302 138/81 - - 79 -  12/02/17 2233 135/87 97.9 F (36.6 C) Oral 79 -  12/02/17 2159 140/84 - - 89 -  12/02/17 2116 130/79 - - 81 -  12/02/17 1927 131/85 98.1 F (36.7 C) Oral 78 16  12/02/17 1816 132/88 - - 69 18  12/02/17 1700 (!) 152/87 - - 78 18  12/02/17 1652 (!) 151/100 98.2 F (36.8 C) Oral 92 18  LMP 03/16/2017 (Approximate)   BMI 48.01 kg/m    Cervical Exam @ 2:30am 17 July Dilation: 1.5 Effacement (%):  Thick Cervical Position: Posterior Station: -3 Presentation: Vertex Exam by:: Joellyn Haff, CNM    General:  Alert, and cooperative  Cardiovascular: RR no M/R/G auscultated  Respiratory: CTABL  Abdomen: gravid        FHT: baseline rate 145, moderate varibility (between 6-25bpm), + accel (15bpm increase in 15 seconds), no decel present Toco: contraction are occurring about every 4-5 minutes  Patient Active Problem List   Diagnosis Date Noted  . Hypertension 12/02/2017  . Unwanted fertility 11/10/2017  . Obesity affecting pregnancy in third trimester   . Previous cesarean delivery affecting pregnancy   . Supervision of other normal pregnancy, antepartum 06/30/2017  . LGSIL on Pap smear of cervix 05/27/2016  . Chronic hypertension affecting pregnancy 04/03/2016  . Rubella non-immune status, antepartum 04/03/2016  . Rh negative, antepartum 02/14/2016  . Obstructive sleep apnea 11/01/2010    Assessment / Plan: 27 y.o. G2P1001 at [redacted]w[redacted]d here for IOL for  uncontrolled cHTN and oligohydramnios.    Labor: Pt is  on 916mU/min of Pitcon and has FB in (traction reapplied and optimized approx every hour); Pt is expected to progress to active phase while gradually increasing Pitocin and proceed to vaginal birth.   Fetal Wellbeing:  Category 2 FHT, manage expectantly.   Pain Control:  Pt is feeling contractions and experiencing pain.  Pt's pain is being managed non-pharmacologically.  Anticipated MOD:  VBAC (ToLAC)  Sula RumpleChika I Chukwu, MS3 12/03/2017, 4:11 PM   I confirm that I have verified the information documented in the student's note.  Donette LarryBhambri, Marcus Groll, CNM  12/03/2017 5:39 PM

## 2017-12-03 NOTE — Progress Notes (Signed)
Patient ID: Leone BrandLapoesche Nicole Tassin, female   DOB: 02/03/1991, 27 y.o.   MRN: [id number] Leone BrandLapoesche Nicole Ekstrand is a 27 y.o. G2P1001 at 437w3d admitted for induction of labor due to borderline oligo/CHTN.  Called to room by RN for FHR down  Subjective: Just returned from bathroom, feeling pain/pressure lower abd w/ uc's otherwise just pressure from foley balloon  Objective: BP 134/80   Pulse 69   Temp 97.9 F (36.6 C) (Oral)   Resp 20   Ht 5\' 3"  (1.6 m)   Wt 122.9 kg (271 lb)   LMP 03/16/2017 (Approximate)   BMI 48.01 kg/m  No intake/output data recorded.  FHT:  FHR: 125-130 bpm, variability: moderate,  accelerations:  Abscent,  decelerations:  Absent prior to going to bathroom, then on return 10min later appeared to be 120 briefly then into variable- then total of 7min prolonged. Pt was turned to Lt side, given O2, pitocin was stopped all prior to me arriving.  UC:   q 2-655mins  SVE:   Foley bulb still in, removed as prolonged was not resolving w/ other measures>baseline up to 130 w/ good variability Unable to adequately check cx while pt lying on side, will give baby time to recover, then check cx  Pitocin: turned off, was at 313mu/min  Labs: Lab Results  Component Value Date   WBC 8.3 12/02/2017   HGB 11.2 (L) 12/02/2017   HCT 34.5 (L) 12/02/2017   MCV 80.0 12/02/2017   PLT 207 12/02/2017    Assessment / Plan: IOL d/t borderline oligo/CHTN. Foley bulb removed d/t prolonged decel, will check cervix once baby recovered and can change mom's position again  Labor: cervical ripening Fetal Wellbeing:  Category II Pain Control:  IV pain meds Pre-eclampsia: chtn, bp's stable I/D:  n/a Anticipated MOD:  VBAC  Cheral MarkerKimberly R Booker CNM, WHNP-BC 12/03/2017, 12:43 AM

## 2017-12-03 NOTE — Progress Notes (Signed)
Patient ID: Brittany Cordova, female   DOB: 07-04-1990, 27 y.o.   MRN: [id number] Brittany Cordova is a 27 y.o. G2P1001 at 2663w3d admitted for induction of labor due to borderline oligo/CHTN.  Subjective: Doing well  Objective: BP 105/63   Pulse 79   Temp 98.2 F (36.8 C) (Oral)   Resp 16   Ht 5\' 3"  (1.6 m)   Wt 122.9 kg (271 lb)   LMP 03/16/2017 (Approximate)   BMI 48.01 kg/m  No intake/output data recorded.  FHT:  FHR: 130 bpm, variability: moderate,  accelerations:  Present,  decelerations:  Absent UC:   irregular  SVE:   Dilation: 1.5 Effacement (%): Thick Station: -3 Exam by:: Brittany Cordova, CNM  Cervical foley bulb inserted and inflated w/ 60ml LR w/o difficulty   Labs: Lab Results  Component Value Date   WBC 8.3 12/02/2017   HGB 11.2 (L) 12/02/2017   HCT 34.5 (L) 12/02/2017   MCV 80.0 12/02/2017   PLT 207 12/02/2017    Assessment / Plan: IOL d/t borderline oligo/CHTN, TOLAC, 7min prolonged and foley bulb removed at 0030, baby Cat 1, foley bulb replaced, wait 30mins as long as still Cat 1, start pitocin, not to exceed 876mu/min while foley in  Labor: cervical ripening Fetal Wellbeing:  Category I Pain Control:  IV pain meds Pre-eclampsia: n/a I/D:  n/a Anticipated MOD:  VBAC  Cheral MarkerKimberly R Estell Puccini CNM, WHNP-BC 12/03/2017, 2:30 AM

## 2017-12-03 NOTE — Progress Notes (Signed)
Patient ID: Brittany Cordova, female   DOB: 11/02/1990, 27 y.o.   MRN: [id number] Doing well, wants to walk but staff hesitant due to decel last night Vitals:   12/03/17 2033 12/03/17 2140 12/03/17 2202 12/03/17 2232  BP: 123/70 123/76 (!) 114/57 122/69  Pulse: 73 72 69 68  Resp:  20    Temp:  98.8 F (37.1 C)    TempSrc:  Oral    Weight:      Height:       FHR stable and reactive, category I UCs not tracing well  Dilation: 4 Effacement (%): Thick Cervical Position: Posterior Station: -3 Presentation: Vertex Exam by:: Brittany Cordova, CNM  Will continue plan of care

## 2017-12-03 NOTE — Progress Notes (Signed)
Labor Progress Note Brittany Cordova is a [age] y.o. G2P1001 at 622w3d presented for IOL for oligo, TOLAC  S:  Comfortable but feels frustrated and hungry, asking how long IOL will take  O:  BP 114/64   Pulse 72   Temp 98.1 F (36.7 C) (Oral)   Resp 16   Ht 5\' 3"  (1.6 m)   Wt 271 lb (122.9 kg)   LMP 03/16/2017 (Approximate)   BMI 48.01 kg/m  EFM: baseline 140 bpm/ mod variability/ + accels/ no decels  Toco: rare SVE: Dilation: 1.5 Effacement (%): Thick Cervical Position: Posterior Station: -3 Presentation: Vertex Exam by:: Brittany Cordova, CNM Pitocin: 6 mu/min  A/P: 27 y.o. G2P1001 732w3d  1. Labor: latent 2. FWB: Cat I 3. Pain: analgesia/anesthesia prn  Bp stable. FB in place, pt refusing to allow RN to apply traction and tape. Will allow to eat for now. Continue low dose Pitocin and titrate once FB out. Anticipate VBAC.  Donette LarryMelanie Loreal Schuessler, CNM 1:26 PM

## 2017-12-03 NOTE — Progress Notes (Signed)
Pt requesting to take a bath.  CNM ok with pt getting washed up but not getting in shower due to IV.

## 2017-12-04 ENCOUNTER — Inpatient Hospital Stay (HOSPITAL_COMMUNITY): Payer: Medicaid Other | Admitting: Anesthesiology

## 2017-12-04 DIAGNOSIS — O1002 Pre-existing essential hypertension complicating childbirth: Secondary | ICD-10-CM

## 2017-12-04 DIAGNOSIS — Z3A37 37 weeks gestation of pregnancy: Secondary | ICD-10-CM

## 2017-12-04 LAB — CBC
HCT: 33.5 % — ABNORMAL LOW (ref 36.0–46.0)
HEMOGLOBIN: 11 g/dL — AB (ref 12.0–15.0)
MCH: 26.2 pg (ref 26.0–34.0)
MCHC: 32.8 g/dL (ref 30.0–36.0)
MCV: 79.8 fL (ref 78.0–100.0)
Platelets: 192 10*3/uL (ref 150–400)
RBC: 4.2 MIL/uL (ref 3.87–5.11)
RDW: 15.9 % — ABNORMAL HIGH (ref 11.5–15.5)
WBC: 7.4 10*3/uL (ref 4.0–10.5)

## 2017-12-04 MED ORDER — PHENYLEPHRINE 40 MCG/ML (10ML) SYRINGE FOR IV PUSH (FOR BLOOD PRESSURE SUPPORT)
80.0000 ug | PREFILLED_SYRINGE | INTRAVENOUS | Status: DC | PRN
  Filled 2017-12-04: qty 5

## 2017-12-04 MED ORDER — LIDOCAINE HCL (PF) 1 % IJ SOLN
INTRAMUSCULAR | Status: DC | PRN
  Administered 2017-12-04: 3 mL via EPIDURAL
  Administered 2017-12-04: 2 mL via EPIDURAL
  Administered 2017-12-04: 5 mL via EPIDURAL

## 2017-12-04 MED ORDER — SODIUM CHLORIDE 0.9 % IV SOLN
2.0000 g | Freq: Four times a day (QID) | INTRAVENOUS | Status: DC
  Filled 2017-12-04 (×3): qty 2000

## 2017-12-04 MED ORDER — LACTATED RINGERS IV SOLN: 500.0000 mL | Freq: Once | INTRAVENOUS | Status: DC

## 2017-12-04 MED ORDER — EPHEDRINE 5 MG/ML INJ
10.0000 mg | INTRAVENOUS | Status: DC | PRN
  Filled 2017-12-04: qty 2

## 2017-12-04 NOTE — Progress Notes (Signed)
Brittany Cordova is a 27 y.o. G2P1001 at 2356w4d by ultrasound admitted for induction of labor due to Hypertension and Poor fetal growth.  Subjective: Pt denies complaints.   Objective: BP (!) 124/102   Pulse 77   Temp 98.8 F (37.1 C) (Oral)   Resp 16   Ht 5\' 3"  (1.6 m)   Wt 271 lb (122.9 kg)   LMP 03/16/2017 (Approximate)   BMI 48.01 kg/m  I/O last 3 completed shifts: In: 300 [I.V.:300] Out: 475 [Urine:475] Total I/O In: -  Out: 400 [Urine:400]  FHT: Cat 1 UC:   regular, every 2 minutes SVE:   Dilation: 5 Effacement (%): 90 Station: 0 Exam by:: Harraway-Smith, MD  Labs: Lab Results  Component Value Date   WBC 7.4 12/04/2017   HGB 11.0 (L) 12/04/2017   HCT 33.5 (L) 12/04/2017   MCV 79.8 12/04/2017   PLT 192 12/04/2017    Assessment / Plan: Induction of labor due to IUGR and CHTN,  progressing well on pitocin  Labor: starting to progress. Active phase labor. Fetal Wellbeing:  Category I Pain Control:  Epidural I/D:  n/a Anticipated MOD:  NSVD  Willodean RosenthalCarolyn Harraway-Smith 12/04/2017, 8:36 PM

## 2017-12-04 NOTE — Progress Notes (Addendum)
Patient ID: Brittany Cordova, female   DOB: 1991/03/08, 27 y.o.   MRN: [id number] Labor Progress Note Brittany Cordova is a 27 y.o. G2P1001 at 7322w4d presented for IOL for FGR and borderline oligohydramnios and is a TOLAC. Patient has a history significant for cHTN on no medications.  S:  Patient is feeling well and denies any symptoms of headache, blurred vision, nausea or vomiting. She states she feels like she has to have a bowel movement and it feels like something is in her bottom.   FHR is 145 bpm, moderate variability, accelerations absent, variable decelerations.   O:  BP (!) 124/102   Pulse 77   Temp 98.8 F (37.1 C) (Oral)   Resp 16   Ht 5\' 3"  (1.6 m)   Wt 122.9 kg (271 lb)   LMP 03/16/2017 (Approximate)   BMI 48.01 kg/m     CVE: Dilation: 8 Effacement (%): 90 Cervical Position: Middle Station: 0 Presentation: Vertex Exam by:: Drenda FreezeFran, CNM   Vitals:   12/04/17 1936 12/04/17 2002 12/04/17 2032 12/04/17 2058  BP: (!) 147/101 126/74 (!) 124/102   Pulse: (!) 109 75 77   Resp: 16 16  16   Temp: 98.8 F (37.1 C)     TempSrc: Oral     Weight:      Height:         A&P: 27 y.o. G2P1001 7022w4d TOLAC with cHTN with IOL due to FGR and borderline oligohydramnios #Labor: Progressing well on pitocin, in active phase of labor #Pain: Epidural #FWB: Category I #GBS negative   Candie MileLindsey R Mitchell, Student-PA 9:01 PM

## 2017-12-04 NOTE — Progress Notes (Signed)
Patient ID: Brittany Cordova, female   DOB: 1990/10/06, 27 y.o.   MRN: [id number] Not feeling contractions Vitals:   12/03/17 2033 12/03/17 2140 12/03/17 2202 12/03/17 2232  BP: 123/70 123/76 (!) 114/57 122/69  Pulse: 73 72 69 68  Resp:  20    Temp:  98.8 F (37.1 C)    TempSrc:  Oral    Weight:      Height:       FHR stable UCs not tracing  AROM, little fluid Bloody show Dilation: 2 Effacement (%): 60 Cervical Position: Posterior Station: -3 Presentation: Vertex Exam by:: Mayford KnifeWilliams, CNM Pt would not tolerate internal monitors at this time Will try later  Brittany Cordova, [name] L, CNM

## 2017-12-04 NOTE — Progress Notes (Signed)
Donne Homero Fellersicole Rossel is a 27 y.o. G2P1001 at 8914w4d by ultrasound admitted for induction of labor due to Hypertension and Poor fetal growth.  Subjective: Pt without complaints. She is beginning to feel pain with contractions.  Objective: BP 131/72   Pulse 67   Temp 98.2 F (36.8 C)   Resp 16   Ht 5\' 3"  (1.6 m)   Wt 271 lb (122.9 kg)   LMP 03/16/2017 (Approximate)   BMI 48.01 kg/m  No intake/output data recorded. No intake/output data recorded.  FHT:  FHR: 140's bpm, variability: moderate,  accelerations:  Present,  decelerations:  Absent UC:   Irregular. MVUs 130's  IUPC placed.  SVE:   Dilation: 3 Effacement (%): 60 Station: -3 Exam by:: Harraway-smith  Labs: Lab Results  Component Value Date   WBC 7.4 12/04/2017   HGB 11.0 (L) 12/04/2017   HCT 33.5 (L) 12/04/2017   MCV 79.8 12/04/2017   PLT 192 12/04/2017   12/01/2017 Est. FW:    2208  gm    4 lb 14 oz    < 10  % Assessment / Plan: Induction of labor due to IUGR and Chronic HTN,  progressing well on pitocin  Labor: prolonged induction. Still not favorable after 48 hours HOWEVER, IUPC shows that ctx not adequate.   Fetal Wellbeing:  Category I Pain Control: IV pain meds I/D:  n/a Anticipated MOD: I discussed with pt the plan for delivery. In her prev pregnancy, she was a 3 days induction [facility]  I offered her a repeat c/s if she desired but, made her aware that any subsequent deliveries would be by c/s if she chose that option. Pt wants to consider her options and speak to her family and rediscuss in a few hours.   Willodean RosenthalCarolyn Harraway-Smith 12/04/2017, 9:35 AM

## 2017-12-04 NOTE — Anesthesia Procedure Notes (Signed)
Epidural Patient location during procedure: OB Start time: 12/04/2017 1:45 PM End time: 12/04/2017 1:52 PM  Staffing Anesthesiologist: Marcene DuosFitzgerald, Trevia Nop, MD Performed: anesthesiologist   Preanesthetic Checklist Completed: patient identified, site marked, surgical consent, pre-op evaluation, timeout performed, IV checked, risks and benefits discussed and monitors and equipment checked  Epidural Patient position: sitting Prep: site prepped and draped and DuraPrep Patient monitoring: continuous pulse ox and blood pressure Approach: midline Location: L3-L4 Injection technique: LOR air  Needle:  Needle type: Tuohy  Needle gauge: 17 G Needle length: 9 cm and 9 Needle insertion depth: 8 cm Catheter type: closed end flexible Catheter size: 19 Gauge Catheter at skin depth: 16 cm Test dose: negative  Assessment Events: blood not aspirated, injection not painful, no injection resistance, negative IV test and no paresthesia

## 2017-12-04 NOTE — Anesthesia Preprocedure Evaluation (Signed)
Anesthesia Evaluation  Patient identified by MRN, date of birth, ID band Patient awake    Reviewed: Allergy & Precautions, Patient's Chart, lab work & pertinent test results  Airway Mallampati: III       Dental   Pulmonary asthma , sleep apnea , former smoker,    Pulmonary exam normal        Cardiovascular hypertension, Normal cardiovascular exam     Neuro/Psych negative neurological ROS     GI/Hepatic negative GI ROS, Neg liver ROS,   Endo/Other  Morbid obesity  Renal/GU negative Renal ROS     Musculoskeletal   Abdominal   Peds  Hematology  (+) anemia ,   Anesthesia Other Findings   Reproductive/Obstetrics (+) Pregnancy (TOLAC)                             Lab Results  Component Value Date   WBC 7.4 12/04/2017   HGB 11.0 (L) 12/04/2017   HCT 33.5 (L) 12/04/2017   MCV 79.8 12/04/2017   PLT 192 12/04/2017    Anesthesia Physical Anesthesia Plan  ASA: III  Anesthesia Plan: Epidural   Post-op Pain Management:    Induction:   PONV Risk Score and Plan: Treatment may vary due to age or medical condition  Airway Management Planned: Natural Airway  Additional Equipment:   Intra-op Plan:   Post-operative Plan:   Informed Consent: I have reviewed the patients History and Physical, chart, labs and discussed the procedure including the risks, benefits and alternatives for the proposed anesthesia with the patient or authorized representative who has indicated his/her understanding and acceptance.     Plan Discussed with:   Anesthesia Plan Comments:         Anesthesia Quick Evaluation

## 2017-12-04 NOTE — Progress Notes (Signed)
Brittany Cordova is a 27 y.o. G2P1001 at 2232w4d by ultrasound admitted for induction of labor due to Hypertension and Poor fetal growth.  Subjective: Pt denies complaints. She is comfortable with the epidural.   Objective: BP 111/62   Pulse 77   Temp 98.8 F (37.1 C) (Oral)   Resp 16   Ht 5\' 3"  (1.6 m)   Wt 271 lb (122.9 kg)   LMP 03/16/2017 (Approximate)   BMI 48.01 kg/m  No intake/output data recorded. Total I/O In: 300 [I.V.:300] Out: 475 [Urine:475]  FHT:  FHR: 150's bpm, variability: moderate,  accelerations:  Present,  decelerations:  Absent UC:   Pt is having couplet contractions.  They are regular. But, have been spacing out.     SVE:   Dilation: 3 Effacement (%): 90 Station: 0 Exam by:: Dr Erin FullingHarraway-Smith  Labs: Lab Results  Component Value Date   WBC 7.4 12/04/2017   HGB 11.0 (L) 12/04/2017   HCT 33.5 (L) 12/04/2017   MCV 79.8 12/04/2017   PLT 192 12/04/2017    Assessment / Plan: Induction of labor due to IUGR and CHTN. Pt is making changes on 30mIu of pitocin but, they are slow. Pt wants to continue TOL.   Labor: still in latent phase of labor.  Preeclampsia:  BP stable Fetal Wellbeing:  Category I Pain Control:  Epidural I/D:  n/a Anticipated MOD:  attempting a VBAC.     Brittany Cordova 12/04/2017, 6:14 PM

## 2017-12-04 NOTE — Progress Notes (Signed)
   Brittany Cordova is a 27 y.o. G2P1001 at 4934w4d  admitted for induction of labor due to FGR, borderline oligo.  Subjective: Patient feeling uncomfortable; epidural has not started to work yet. She denies HA, blurry vision, epigastric pain.   Objective: Vitals:   12/04/17 1403 12/04/17 1407 12/04/17 1412 12/04/17 1417  BP: (!) 160/92 (!) 152/92 (!) 160/89 (!) 154/88  Pulse: 67 68 68 68  Resp: 18 16 16    Temp:      TempSrc:      Weight:      Height:       Total I/O In: 300 [I.V.:300] Out: -   FHT:  FHR: 135 bpm, variability: moderate,  accelerations:  Present,  decelerations:  Present occasioanl variable; patient is being repositioned by RN UC:   irregular, every 2-473minutes SVE:   Dilation: 3 Effacement (%): 60 Station: -3 Exam by:: Harraway-smith Pitocin @ 28 mu/min MVU at 210 (adequate)  Labs: Lab Results  Component Value Date   WBC 7.4 12/04/2017   HGB 11.0 (L) 12/04/2017   HCT 33.5 (L) 12/04/2017   MCV 79.8 12/04/2017   PLT 192 12/04/2017    Assessment / Plan: Protracted latent phase Patient has not been examined since 9 am, just got epidural and it is now starting to work. Will check cervix is as soon as patient is able to tolerate vaginal exam; MVU has been 210 at 2 pm and 270 at 1 pm.  Patient wants to continue TOLAC. BP elevated but has not required treatment; two elevated blood pressures but RN believes that this is positional and related to pain. If patient has another elevated blood presssure that is severe range, we will give IV bp meds.   Labor: still in early labor Fetal Wellbeing:  Category I Pain Control:  Labor support without medications Anticipated MOD:  NSVD  Brittany Cordova 12/04/2017, 2:21 PM

## 2017-12-05 ENCOUNTER — Encounter (HOSPITAL_COMMUNITY): Payer: Self-pay

## 2017-12-05 LAB — CBC
HCT: 30.3 % — ABNORMAL LOW (ref 36.0–46.0)
Hemoglobin: 10 g/dL — ABNORMAL LOW (ref 12.0–15.0)
MCH: 26.2 pg (ref 26.0–34.0)
MCHC: 33 g/dL (ref 30.0–36.0)
MCV: 79.3 fL (ref 78.0–100.0)
Platelets: 173 10*3/uL (ref 150–400)
RBC: 3.82 MIL/uL — AB (ref 3.87–5.11)
RDW: 15.5 % (ref 11.5–15.5)
WBC: 11.3 10*3/uL — AB (ref 4.0–10.5)

## 2017-12-05 MED ORDER — TETANUS-DIPHTH-ACELL PERTUSSIS 5-2.5-18.5 LF-MCG/0.5 IM SUSP: 0.5000 mL | Freq: Once | INTRAMUSCULAR | Status: DC

## 2017-12-05 MED ORDER — METHYLERGONOVINE MALEATE 0.2 MG PO TABS: 0.2000 mg | ORAL_TABLET | ORAL | Status: DC | PRN

## 2017-12-05 MED ORDER — METHYLERGONOVINE MALEATE 0.2 MG/ML IJ SOLN: 0.2000 mg | INTRAMUSCULAR | Status: DC | PRN

## 2017-12-05 MED ORDER — PRENATAL MULTIVITAMIN CH
1.0000 | ORAL_TABLET | Freq: Every day | ORAL | Status: DC
  Filled 2017-12-05: qty 1

## 2017-12-05 MED ORDER — FLEET ENEMA 7-19 GM/118ML RE ENEM: 1.0000 | ENEMA | Freq: Every day | RECTAL | Status: DC | PRN

## 2017-12-05 MED ORDER — RHO D IMMUNE GLOBULIN 1500 UNIT/2ML IJ SOSY
300.0000 ug | PREFILLED_SYRINGE | Freq: Once | INTRAMUSCULAR | Status: AC
  Administered 2017-12-05: 300 ug via INTRAVENOUS
  Filled 2017-12-05: qty 2

## 2017-12-05 MED ORDER — BENZOCAINE-MENTHOL 20-0.5 % EX AERO
1.0000 "application " | INHALATION_SPRAY | CUTANEOUS | Status: DC | PRN
  Administered 2017-12-05: 1 via TOPICAL
  Filled 2017-12-05: qty 56

## 2017-12-05 MED ORDER — COCONUT OIL OIL: 1.0000 "application " | TOPICAL_OIL | Status: DC | PRN

## 2017-12-05 MED ORDER — FERROUS SULFATE 325 (65 FE) MG PO TABS
325.0000 mg | ORAL_TABLET | Freq: Two times a day (BID) | ORAL | Status: DC
  Administered 2017-12-05 – 2017-12-06 (×3): 325 mg via ORAL
  Filled 2017-12-05 (×4): qty 1

## 2017-12-05 MED ORDER — BISACODYL 10 MG RE SUPP: 10.0000 mg | Freq: Every day | RECTAL | Status: DC | PRN

## 2017-12-05 MED ORDER — DOCUSATE SODIUM 100 MG PO CAPS
100.0000 mg | ORAL_CAPSULE | Freq: Two times a day (BID) | ORAL | Status: DC
  Administered 2017-12-05 – 2017-12-06 (×4): 100 mg via ORAL
  Filled 2017-12-05 (×4): qty 1

## 2017-12-05 MED ORDER — ACETAMINOPHEN 325 MG PO TABS
650.0000 mg | ORAL_TABLET | Freq: Once | ORAL | Status: AC
  Administered 2017-12-05: 650 mg via ORAL
  Filled 2017-12-05: qty 2

## 2017-12-05 MED ORDER — ONDANSETRON HCL 4 MG/2ML IJ SOLN: 4.0000 mg | INTRAMUSCULAR | Status: DC | PRN

## 2017-12-05 MED ORDER — ONDANSETRON HCL 4 MG PO TABS: 4.0000 mg | ORAL_TABLET | ORAL | Status: DC | PRN

## 2017-12-05 MED ORDER — WITCH HAZEL-GLYCERIN EX PADS: 1.0000 "application " | MEDICATED_PAD | CUTANEOUS | Status: DC | PRN

## 2017-12-05 MED ORDER — NIFEDIPINE ER OSMOTIC RELEASE 30 MG PO TB24
30.0000 mg | ORAL_TABLET | Freq: Every day | ORAL | Status: DC
  Administered 2017-12-05 – 2017-12-06 (×2): 30 mg via ORAL
  Filled 2017-12-05 (×2): qty 1

## 2017-12-05 MED ORDER — ACETAMINOPHEN 325 MG PO TABS
650.0000 mg | ORAL_TABLET | ORAL | Status: DC | PRN
  Administered 2017-12-05 – 2017-12-06 (×2): 650 mg via ORAL
  Filled 2017-12-05 (×2): qty 2

## 2017-12-05 MED ORDER — ZOLPIDEM TARTRATE 5 MG PO TABS: 5.0000 mg | ORAL_TABLET | Freq: Every evening | ORAL | Status: DC | PRN

## 2017-12-05 MED ORDER — SIMETHICONE 80 MG PO CHEW: 80.0000 mg | CHEWABLE_TABLET | ORAL | Status: DC | PRN

## 2017-12-05 MED ORDER — DIBUCAINE 1 % RE OINT: 1.0000 "application " | TOPICAL_OINTMENT | RECTAL | Status: DC | PRN

## 2017-12-05 MED ORDER — MEASLES, MUMPS & RUBELLA VAC ~~LOC~~ INJ
0.5000 mL | INJECTION | Freq: Once | SUBCUTANEOUS | Status: DC
  Filled 2017-12-05: qty 0.5

## 2017-12-05 MED ORDER — COMPLETENATE 29-1 MG PO CHEW
1.0000 | CHEWABLE_TABLET | Freq: Every day | ORAL | Status: DC
  Administered 2017-12-05 – 2017-12-06 (×2): 1 via ORAL
  Filled 2017-12-05 (×3): qty 1

## 2017-12-05 MED ORDER — DIPHENHYDRAMINE HCL 25 MG PO CAPS: 25.0000 mg | ORAL_CAPSULE | Freq: Four times a day (QID) | ORAL | Status: DC | PRN

## 2017-12-05 NOTE — Lactation Note (Signed)
This note was copied from a baby's chart. Lactation Consultation Note  Patient Name: Brittany Lupita LeashLapoesche Minkler [id number]'VToday's Date: 12/05/2017 Reason for consult: Follow-up assessment;Difficult latch  Mother requesting latch assistance:  Mother had already breastfed on the right breast but asked for help latching onto the left breast.  Assisted baby to latch on the left breast in the football hold.  Reminded mother to be patient and wait for a wide mouth before latching.  Infant latched and began sucking with intermittent audible swallows noted.  Mother stated she heard him swallowing on the right breast.    Encouraged feeding 8-12 times/24 hours or more if he shows feeding cues.  Continue STS and hand expression after feedings.  Mother will feed any EBM she obtains to baby.  Grandmother present and mother will call for assistance as needed.   Maternal Data Formula Feeding for Exclusion: No Has patient been taught Hand Expression?: Yes  Feeding Feeding Type: Breast Fed Length of feed: 8 min  LATCH Score Latch: Repeated attempts needed to sustain latch, nipple held in mouth throughout feeding, stimulation needed to elicit sucking reflex.  Audible Swallowing: A few with stimulation  Type of Nipple: Everted at rest and after stimulation(short shafted bilaterally)  Comfort (Breast/Nipple): Soft / non-tender  Hold (Positioning): Assistance needed to correctly position infant at breast and maintain latch.  LATCH Score: 7  Interventions Interventions: Breast feeding basics reviewed;Assisted with latch;Skin to skin;Breast massage;Position options;Support pillows;Adjust position;Breast compression;Shells;Hand pump  Lactation Tools Discussed/Used     Consult Status Consult Status: Follow-up Date: 12/06/17 Follow-up type: In-patient    Aubre Quincy R Tenishia Ekman 12/05/2017, 5:35 AM

## 2017-12-05 NOTE — Anesthesia Postprocedure Evaluation (Signed)
Anesthesia Post Note  Patient: Brittany Cordova  Procedure(s) Performed: AN AD HOC LABOR EPIDURAL     Patient location during evaluation: Mother Baby Anesthesia Type: Epidural Level of consciousness: awake Pain management: pain level controlled Vital Signs Assessment: post-procedure vital signs reviewed and stable Respiratory status: spontaneous breathing Cardiovascular status: stable Postop Assessment: no headache, no backache, epidural receding, patient able to bend at knees, no apparent nausea or vomiting, adequate PO intake and able to ambulate Anesthetic complications: no    Last Vitals:  Vitals:   12/05/17 0230 12/05/17 0645  BP: (!) 145/87 127/71  Pulse: 92 72  Resp: 18 18  Temp: 37 C 37.1 C    Last Pain:  Vitals:   12/05/17 0645  TempSrc: Oral  PainSc: 0-No pain   Pain Goal: Patients Stated Pain Goal: 8 (12/02/17 0805)               Fanny DanceMULLINS,[name]

## 2017-12-05 NOTE — Lactation Note (Signed)
This note was copied from a baby's chart. Lactation Consultation Note  Patient Name: Brittany Cordova Brittany Cordova'UToday's Date: 12/05/2017 Reason for consult: Initial assessment;Early term 37-38.6wks;Infant < 6lbs  P2 mother whose infant is now 324 hours old. This is an ETI at 37+4 weeks who weighs 4+13.4 oz.  Baby was sleeping in bassinet as I arrived.  Mother was having a hard time staying awake so my teaching was kept to a minimum.  RN in room finishing her teaching.  Reviewed the behaviors of an ETI related to breastfeeding.  Mother states that he has fed from both breasts and grandmother stated he did well.  Encouraged mother to feed 8-12 times/24 hours or earlier if he shows feeding cues, do STS while awake and hand expression post feeding.  Mother has DEBP at bedside and has already pumped once.  She has fed 4 mls of EBM and has another 5 mls sitting at bedside to be used at the next feeding.  Mother will supplement after every feeding with a curved tip syringe.  She verbalized understanding of feeding plan.  Encouraged her to call RN/LC to observe baby latching.  Mom made aware of O/P services, breastfeeding support groups, community resources, and our phone # for post-discharge questions. Mother will call for assistance as needed.  Grandmother and great grandmother present and supportive.     Maternal Data Formula Feeding for Exclusion: No Has patient been taught Hand Expression?: Yes  Feeding Feeding Type: Breast Milk  LATCH Score Latch: Repeated attempts needed to sustain latch, nipple held in mouth throughout feeding, stimulation needed to elicit sucking reflex.  Audible Swallowing: None  Type of Nipple: Flat  Comfort (Breast/Nipple): Soft / non-tender  Hold (Positioning): Full assist, staff holds infant at breast  LATCH Score: 4  Interventions Interventions: Breast feeding basics reviewed  Lactation Tools Discussed/Used WIC Program: No Pump Review: Setup, frequency, and  cleaning;Milk Storage Initiated by:: Bernadene BellL Thomas RN Date initiated:: 12/05/17   Consult Status Consult Status: Follow-up Date: 12/05/17 Follow-up type: In-patient    Gurbani Figge R Kippy Gohman 12/05/2017, 2:45 AM

## 2017-12-05 NOTE — Progress Notes (Signed)
Dr. Corky Downslsen notified of elevated blood pressure 145/87. No headache, vision disturbances, or epigastric pain. No new orders given.

## 2017-12-05 NOTE — Progress Notes (Signed)
Patient ID: Brittany Cordova, female   DOB: 10-Feb-1991, 27 y.o.   MRN: [id number] POSTPARTUM PROGRESS NOTE  Post Partum Day 1 Subjective:  Brittany Cordova is a 27 y.o. [id number] 565w4d TOLAC patient s/p SVD at 2236. IOL due to FGR and borderline Oligo. Patient pregnancy complicated by history of cHTN and not on medication. No acute events overnight.  Pt denies problems with ambulating, voiding or po intake.  She denies nausea or vomiting. She denies headaches or visual changes. Pain is well controlled. Lochia Minimal.   Objective: Blood pressure (!) 145/87, pulse 92, temperature 98.6 F (37 C), temperature source Oral, resp. rate 18, height 5\' 3"  (1.6 m), weight 122.9 kg (271 lb), last menstrual period 03/16/2017, unknown if currently breastfeeding.  Physical Exam:  General: alert, cooperative and no distress Lochia:normal flow Chest: no respiratory distress Heart:regular rate, distal pulses intact Abdomen: soft, nontender Uterine Fundus: firm, appropriately tender DVT Evaluation: No calf swelling or tenderness Extremities: no edema  Recent Labs    12/04/17 0804 12/05/17 0016  HGB 11.0* 10.0*  HCT 33.5* 30.3*    Vitals:   12/05/17 0002 12/05/17 0020 12/05/17 0055 12/05/17 0230  BP: 129/61 (!) 146/78 130/73 (!) 145/87  Pulse: 82 78 76 92  Resp:   18 18  Temp: (!) 100.4 F (38 C)  99.1 F (37.3 C) 98.6 F (37 C)  TempSrc: Axillary  Axillary Oral  Weight:      Height:        Assessment/Plan:  ASSESSMENT: Brittany Cordova is a 27 y.o. [id number] 7865w4d TOLAC patient s/p SVD after IOL for FGR and borderline oligohydramnios.  Feeding: Both Bottle and Breast Contraception: IUD Circumcision: Outpatient Patient will stay today    LOS: 3 days   Candie MileLindsey R Ernie Kasler 12/05/2017, 7:11 AM

## 2017-12-06 DIAGNOSIS — O365990 Maternal care for other known or suspected poor fetal growth, unspecified trimester, not applicable or unspecified: Secondary | ICD-10-CM | POA: Diagnosis present

## 2017-12-06 LAB — RH IG WORKUP (INCLUDES ABO/RH)
ABO/RH(D): O NEG
Fetal Screen: NEGATIVE
Gestational Age(Wks): 37.4
Unit division: 0

## 2017-12-06 MED ORDER — ACETAMINOPHEN 325 MG PO TABS: 650.0000 mg | ORAL_TABLET | ORAL | Status: DC | PRN

## 2017-12-06 MED ORDER — NIFEDIPINE ER 30 MG PO TB24: 30.0000 mg | ORAL_TABLET | Freq: Every day | ORAL | 1 refills | Status: DC

## 2017-12-06 NOTE — Discharge Summary (Signed)
OB Discharge Summary     Patient Name: Brittany Cordova DOB: 1990/05/25 MRN: [id number]  Date of admission: 12/02/2017 Delivering MD: Christin Fudge   Date of discharge: 12/06/2017  Admitting diagnosis: INDUCTION Intrauterine pregnancy: [redacted]w[redacted]d    Secondary diagnosis:  Active Problems:   Rh negative, antepartum   Chronic hypertension affecting pregnancy   Rubella non-immune status, antepartum   LGSIL on Pap smear of cervix   Supervision of other normal pregnancy, antepartum   Obesity affecting pregnancy in third trimester   Previous cesarean delivery affecting pregnancy   Unwanted fertility   IUGR (intrauterine growth restriction) affecting care of mother  Additional problems: none     Discharge diagnosis: Term Pregnancy Delivered, VBAC and CHTN                                                                                                Post partum procedures:rhogam; MMR recommended  Augmentation: AROM, Pitocin and Foley Balloon  Complications: None  Hospital course:  Induction of Labor With Vaginal Delivery   27y.o. yo [id number] admitted to the hospital 12/02/2017 for induction of labor.  Indication for induction: cHTN with FGR and borderline oligo. She was seen by MFM on 7/15 with EFW <10%/AC<3% with decreased AFI of 6cm. BP was also elevated at 149/90 without s/s pre-e. She was counseled for IOL, but preferred to start it on 7/16. Bloodwork was neg for pre-e. Patient underwent cx ripening and had a successful VBAC as follows: Membrane Rupture Time/Date: 1:33 AM ,12/04/2017   Intrapartum Procedures: Episiotomy: None [1]                                         Lacerations:  1st degree [2]  Patient had delivery of a Viable infant.  Information for the patient's newborn:  GrJalesia, Loudenslager0[[id number]]Delivery Method: Vag-Spont   12/04/2017  Details of delivery can be found in separate delivery note.  Patient had a postpartum course  remarkable for being started on Procardia 30 XL on PPD#1 for BPs in the 140s/80s. Patient is discharged home 12/06/17 with BP 126/71.  Physical exam  Vitals:   12/05/17 0951 12/05/17 0954 12/05/17 1807 12/06/17 0512  BP: (!) 147/84 131/83 127/85 126/71  Pulse: 76 80 94 84  Resp: 18   20  Temp: 98.3 F (36.8 C) 98.3 F (36.8 C) 98.6 F (37 C) 98.2 F (36.8 C)  TempSrc: Oral  Oral Oral  SpO2:   98% 100%  Weight:      Height:       General: alert and cooperative Lochia: appropriate Uterine Fundus: firm Incision: N/A DVT Evaluation: No evidence of DVT seen on physical exam. Labs: Lab Results  Component Value Date   WBC 11.3 (H) 12/05/2017   HGB 10.0 (L) 12/05/2017   HCT 30.3 (L) 12/05/2017   MCV 79.3 12/05/2017   PLT 173 12/05/2017   CMP Latest Ref Rng & Units 12/02/2017  Glucose 70 - 99  mg/dL 120(H)  BUN 6 - 20 mg/dL 10  Creatinine 0.44 - 1.00 mg/dL 0.51  Sodium 135 - 145 mmol/L 135  Potassium 3.5 - 5.1 mmol/L 3.9  Chloride 98 - 111 mmol/L 107  CO2 22 - 32 mmol/L 18(L)  Calcium 8.9 - 10.3 mg/dL 8.9  Total Protein 6.5 - 8.1 g/dL 6.6  Total Bilirubin 0.3 - 1.2 mg/dL 0.3  Alkaline Phos 38 - 126 U/L 128(H)  AST 15 - 41 U/L 17  ALT 0 - 44 U/L 9    Discharge instruction: per After Visit Summary and "Baby and Me Booklet".  After visit meds:  Allergies as of 12/06/2017      Reactions   Ibuprofen Anaphylaxis, Swelling   Tomato Swelling   Mouth/ lip swelling       Medication List    TAKE these medications   acetaminophen 325 MG tablet Commonly known as:  TYLENOL Take 2 tablets (650 mg total) by mouth every 4 (four) hours as needed (for pain scale < 4).   NIFEdipine 30 MG 24 hr tablet Commonly known as:  PROCARDIA-XL/ADALAT CC Take 1 tablet (30 mg total) by mouth daily.   VITAFOL GUMMIES 3.33-0.333-34.8 MG Chew Chew 3 tablets by mouth daily before breakfast.   Vitamin D 2000 units Caps Take 1 capsule (2,000 Units total) by mouth daily before breakfast.        Diet: routine diet  Activity: Advance as tolerated. Pelvic rest for 6 weeks.   Outpatient follow up:1 wk for BP check, then 4wk PP visit Follow up Appt: Future Appointments  Date Time Provider Jeff  12/11/2017 10:30 AM [place] None  01/02/2018  9:10 AM Laury Deep, CNM [facility] None   Follow up Visit:No follow-ups on file.  Postpartum contraception: IUD Mirena  Newborn Data: Live born female  Birth Weight: 4 lb 13.4 oz (2194 g) APGAR: 9, 9  Newborn Delivery   Birth date/time:  12/04/2017 22:36:00 Delivery type:  VBAC, Spontaneous     Baby Feeding: Breast Disposition:home with mother   12/06/2017 Serita Grammes, CNM  9:33 AM

## 2017-12-06 NOTE — Discharge Instructions (Signed)
Postpartum Hypertension °Postpartum hypertension is high blood pressure after pregnancy that remains higher than normal for more than two days after delivery. You may not realize that you have postpartum hypertension if your blood pressure is not being checked regularly. In some cases, postpartum hypertension will go away on its own, usually within a week of delivery. However, for some women, medical treatment is required to prevent serious complications, such as seizures or stroke. °The following things can affect your blood pressure: °· The type of delivery you had. °· Having received IV fluids or other medicines during or after delivery. ° °What are the causes? °Postpartum hypertension may be caused by any of the following or by a combination of any of the following: °· Hypertension that existed before pregnancy (chronic hypertension). °· Gestational hypertension. °· Preeclampsia or eclampsia. °· Receiving a lot of fluid through an IV during or after delivery. °· Medicines. °· HELLP syndrome. °· Hyperthyroidism. °· Stroke. °· Other rare neurological or blood disorders. ° °In some cases, the cause may not be known. °What increases the risk? °Postpartum hypertension can be related to one or more risk factors, such as: °· Chronic hypertension. In some cases, this may not have been diagnosed before pregnancy. °· Obesity. °· Type 2 diabetes. °· Kidney disease. °· Family history of preeclampsia. °· Other medical conditions that cause hormonal imbalances. ° °What are the signs or symptoms? °As with all types of hypertension, postpartum hypertension may not have any symptoms. Depending on how high your blood pressure is, you may experience: °· Headaches. These may be mild, moderate, or severe. They may also be steady, constant, or sudden in onset (thunderclap headache). °· Visual changes. °· Dizziness. °· Shortness of breath. °· Swelling of your hands, feet, lower legs, or face. In some cases, you may have swelling in  more than one of these locations. °· Heart palpitations or a racing heartbeat. °· Difficulty breathing while lying down. °· Decreased urination. ° °Other rare signs and symptoms may include: °· Sweating more than usual. This lasts longer than a few days after delivery. °· Chest pain. °· Sudden dizziness when you get up from sitting or lying down. °· Seizures. °· Nausea or vomiting. °· Abdominal pain. ° °How is this diagnosed? °The diagnosis of postpartum hypertension is made through a combination of physical examination findings and testing of your blood and urine. You may also have additional tests, such as a CT scan or an MRI, to check for other complications of postpartum hypertension. °How is this treated? °When blood pressure is high enough to require treatment, your options may include: °· Medicines to reduce blood pressure (antihypertensives). Tell your health care provider if you are breastfeeding or if you plan to breastfeed. There are many antihypertensive medicines that are safe to take while breastfeeding. °· Stopping medicines that may be causing hypertension. °· Treating medical conditions that are causing hypertension. °· Treating the complications of hypertension, such as seizures, stroke, or kidney problems. ° °Your health care provider will also continue to monitor your blood pressure closely and repeatedly until it is within a safe range for you. °Follow these instructions at home: °· Take medicines only as directed by your health care provider. °· Get regular exercise after your health care provider tells you that it is safe. °· Follow your health care provider’s recommendations on fluid and salt restrictions. °· Do not use any tobacco products, including cigarettes, chewing tobacco, or electronic cigarettes. If you need help quitting, ask your health care provider. °·   Keep all follow-up visits as directed by your health care provider. This is important. °Contact a health care provider  if: °· Your symptoms get worse. °· You have new symptoms, such as: °? Headache. °? Dizziness. °? Visual changes. °Get help right away if: °· You develop a severe or sudden headache. °· You have seizures. °· You develop numbness or weakness on one side of your body. °· You have difficulty thinking, speaking, or swallowing. °· You develop severe abdominal pain. °· You develop difficulty breathing, chest pain, a racing heartbeat, or heart palpitations. °These symptoms may represent a serious problem that is an emergency. Do not wait to see if the symptoms will go away. Get medical help right away. Call your local emergency services (911 in the [place] Do not drive yourself to the hospital. °This information is not intended to replace advice given to you by your health care provider. Make sure you discuss any questions you have with your health care provider. °Document Released: 01/07/2014 Document Revised: 10/09/2015 Document Reviewed: 11/18/2013 °Elsevier Interactive Patient Education © 2018 [employer] °Vaginal Delivery, Care After °Refer to this sheet in the next few weeks. These instructions provide you with information about caring for yourself after vaginal delivery. Your health care provider may also give you more specific instructions. Your treatment has been planned according to current medical practices, but problems sometimes occur. Call your health care provider if you have any problems or questions. °What can I expect after the procedure? °After vaginal delivery, it is common to have: °· Some bleeding from your vagina. °· Soreness in your abdomen, your vagina, and the area of skin between your vaginal opening and your anus (perineum). °· Pelvic cramps. °· Fatigue. ° °Follow these instructions at home: °Medicines °· Take over-the-counter and prescription medicines only as told by your health care provider. °· If you were prescribed an antibiotic medicine, take it as told by your health care provider. Do  not stop taking the antibiotic until it is finished. °Driving ° °· Do not drive or operate heavy machinery while taking prescription pain medicine. °· Do not drive for 24 hours if you received a sedative. °Lifestyle °· Do not drink alcohol. This is especially important if you are breastfeeding or taking medicine to relieve pain. °· Do not use tobacco products, including cigarettes, chewing tobacco, or e-cigarettes. If you need help quitting, ask your health care provider. °Eating and drinking °· Drink at least 8 eight-ounce glasses of water every day unless you are told not to by your health care provider. If you choose to breastfeed your baby, you may need to drink more water than this. °· Eat high-fiber foods every day. These foods may help prevent or relieve constipation. High-fiber foods include: °? Whole grain cereals and breads. °? Brown rice. °? Beans. °? Fresh fruits and vegetables. °Activity °· Return to your normal activities as told by your health care provider. Ask your health care provider what activities are safe for you. °· Rest as much as possible. Try to rest or take a nap when your baby is sleeping. °· Do not lift anything that is heavier than your baby or 10 lb (4.5 kg) until your health care provider says that it is safe. °· Talk with your health care provider about when you can engage in sexual activity. This may depend on your: °? Risk of infection. °? Rate of healing. °? Comfort and desire to engage in sexual activity. °Vaginal Care °· If you have an episiotomy   or a vaginal tear, check the area every day for signs of infection. Check for: °? More redness, swelling, or pain. °? More fluid or blood. °? Warmth. °? Pus or a bad smell. °· Do not use tampons or douches until your health care provider says this is safe. °· Watch for any blood clots that may pass from your vagina. These may look like clumps of dark red, brown, or black discharge. °General instructions °· Keep your perineum clean and  dry as told by your health care provider. °· Wear loose, comfortable clothing. °· Wipe from front to back when you use the toilet. °· Ask your health care provider if you can shower or take a bath. If you had an episiotomy or a perineal tear during labor and delivery, your health care provider may tell you not to take baths for a certain length of time. °· Wear a bra that supports your breasts and fits you well. °· If possible, have someone help you with household activities and help care for your baby for at least a few days after you leave the hospital. °· Keep all follow-up visits for you and your baby as told by your health care provider. This is important. °Contact a health care provider if: °· You have: °? Vaginal discharge that has a bad smell. °? Difficulty urinating. °? Pain when urinating. °? A sudden increase or decrease in the frequency of your bowel movements. °? More redness, swelling, or pain around your episiotomy or vaginal tear. °? More fluid or blood coming from your episiotomy or vaginal tear. °? Pus or a bad smell coming from your episiotomy or vaginal tear. °? A fever. °? A rash. °? Little or no interest in activities you used to enjoy. °? Questions about caring for yourself or your baby. °· Your episiotomy or vaginal tear feels warm to the touch. °· Your episiotomy or vaginal tear is separating or does not appear to be healing. °· Your breasts are painful, hard, or turn red. °· You feel unusually sad or worried. °· You feel nauseous or you vomit. °· You pass large blood clots from your vagina. If you pass a blood clot from your vagina, save it to show to your health care provider. Do not flush blood clots down the toilet without having your health care provider look at them. °· You urinate more than usual. °· You are dizzy or light-headed. °· You have not breastfed at all and you have not had a menstrual period for 12 weeks after delivery. °· You have stopped breastfeeding and you have not had  a menstrual period for 12 weeks after you stopped breastfeeding. °Get help right away if: °· You have: °? Pain that does not go away or does not get better with medicine. °? Chest pain. °? Difficulty breathing. °? Blurred vision or spots in your vision. °? Thoughts about hurting yourself or your baby. °· You develop pain in your abdomen or in one of your legs. °· You develop a severe headache. °· You faint. °· You bleed from your vagina so much that you fill two sanitary pads in one hour. °This information is not intended to replace advice given to you by your health care provider. Make sure you discuss any questions you have with your health care provider. °Document Released: 05/03/2000 Document Revised: 10/18/2015 Document Reviewed: 05/21/2015 °Elsevier Interactive Patient Education © 2018 Elsevier Inc. ° °

## 2017-12-06 NOTE — Lactation Note (Signed)
This note was copied from a baby's chart. Lactation Consultation Note  Patient Name: Brittany Cordova Brittany Cordova Date: 12/06/2017 Reason for consult: Follow-up assessment;Infant weight loss;Early term 37-38.6wks;Infant < 6lbs;Other (Comment)(less than 5 pounds ) As LC entered the room, mom asleep, holding baby and grandmother asleep on the couch.  LC woke mom up to mentioned the baby had to be placed in the crib/ for safety. Mom mentioned he will probably wake right back up in the crib. Baby woke up, rooting, and mom attempted to feed and baby fell asleep.  Baby  Is 82 hours old - 1st visit, per mom  baby last fed at 1:05p for 10 mins.  And mom mentioned she had pumped off 4 ml earlier. Per mom  has been feeding back with a syringe and feel comfortable.  [place] asked mom to call for a feeding assessment.  At 2:15p mom called on the nurses light for a feeding assessment/ mom latched the baby independently with depth/ and LC just checked mouth and noted flanged lips/ baby fed for 15 mins/ multiple swallows/ increased with breast compressions/ baby released and nipple was well rounded.STS for the feeding .  Baby asleep and resting on moms chest.  LC reviewed the [facility] plan for a ET infant less than 5 pounds.  LC stressed the importance of feeding with feeding cues and by 3 hours ( 8 plus times a day ).  STS feedings until the baby increases back to birth weight and is gaining steadily.  Nutritive vs non - nutritive feeding patterns/ and to watch for hanging out latched.  If breast are to full to start to express off enough so the areola is compressible for a deep latch.  After 5-6 feedings in 24 hours/ post pump 10-15 mins/ and supplement back to baby.  LC reviewed storage of breast milk on page 36 in the Mother and care booklet .   Mom denies soreness, sore nipple and engorgement prevention and tx reviewed. Per mom has DEBP Medela at home/ and the DEBP kit from the hospital.   [place] recommended considering  LC O/P appt. With [place] for children if more convenient or  LC O/P appt. [place] [facility] O/[facility] and has the number.  Extra feeding diary sheets given to mom with instructions x 2 weeks until the baby is consistent.   Mom more to receptive to teaching and breast feeding review 2nd [place] visit, and asked about how to  To use the pump tubes from the kit with the DEBP.     Maternal Data    Feeding Feeding Type: (last fed ar 1305 ) Length of feed: 10 min(per mom noted swallows and comfort )  LATCH Score                   Interventions Interventions: Breast feeding basics reviewed  Lactation Tools Discussed/Used Tools: Pump Breast pump type: Double-Electric Breast Pump   Consult Status Consult Status: Follow-up Date: 12/06/17 Follow-up type: In-patient    [place] 12/06/2017, 2:00 PM

## 2017-12-08 ENCOUNTER — Other Ambulatory Visit (HOSPITAL_COMMUNITY): Payer: Medicaid Other

## 2017-12-08 ENCOUNTER — Encounter: Payer: Medicaid Other | Admitting: Obstetrics and Gynecology

## 2017-12-09 ENCOUNTER — Other Ambulatory Visit: Payer: Self-pay | Admitting: Obstetrics and Gynecology

## 2017-12-09 DIAGNOSIS — Z3A37 37 weeks gestation of pregnancy: Secondary | ICD-10-CM

## 2017-12-09 DIAGNOSIS — Z6841 Body Mass Index (BMI) 40.0 and over, adult: Secondary | ICD-10-CM

## 2017-12-09 DIAGNOSIS — O26893 Other specified pregnancy related conditions, third trimester: Secondary | ICD-10-CM

## 2017-12-09 DIAGNOSIS — O10919 Unspecified pre-existing hypertension complicating pregnancy, unspecified trimester: Secondary | ICD-10-CM

## 2017-12-09 DIAGNOSIS — Z6791 Unspecified blood type, Rh negative: Secondary | ICD-10-CM

## 2017-12-09 DIAGNOSIS — O34219 Maternal care for unspecified type scar from previous cesarean delivery: Secondary | ICD-10-CM

## 2017-12-14 ENCOUNTER — Inpatient Hospital Stay (HOSPITAL_COMMUNITY): Admission: RE | Admit: 2017-12-14 | Payer: Medicaid Other | Source: Ambulatory Visit

## 2017-12-15 ENCOUNTER — Ambulatory Visit: Payer: Medicaid Other

## 2017-12-15 ENCOUNTER — Other Ambulatory Visit (HOSPITAL_COMMUNITY): Payer: Medicaid Other

## 2017-12-15 ENCOUNTER — Encounter: Payer: Medicaid Other | Admitting: Obstetrics and Gynecology

## 2017-12-15 VITALS — BP 145/88 | HR 90

## 2017-12-15 DIAGNOSIS — Z0130 Encounter for examination of blood pressure without abnormal findings: Secondary | ICD-10-CM

## 2017-12-15 NOTE — Progress Notes (Signed)
Pt is here for nurse visit: BP check. BP 145/88, Dr. Jolayne Pantheronstant advises pt stay on current medication Procardia 1 time daily. Pt verbalized understanding.

## 2017-12-29 ENCOUNTER — Ambulatory Visit (INDEPENDENT_AMBULATORY_CARE_PROVIDER_SITE_OTHER): Payer: Medicaid Other | Admitting: Family Medicine

## 2017-12-29 ENCOUNTER — Encounter: Payer: Self-pay | Admitting: *Deleted

## 2017-12-29 ENCOUNTER — Encounter: Payer: Self-pay | Admitting: Family Medicine

## 2017-12-29 VITALS — BP 130/83 | HR 83 | Resp 16 | Ht 63.0 in | Wt 261.6 lb

## 2017-12-29 DIAGNOSIS — Z30430 Encounter for insertion of intrauterine contraceptive device: Secondary | ICD-10-CM

## 2017-12-29 DIAGNOSIS — Z1389 Encounter for screening for other disorder: Secondary | ICD-10-CM | POA: Diagnosis not present

## 2017-12-29 DIAGNOSIS — Z975 Presence of (intrauterine) contraceptive device: Secondary | ICD-10-CM

## 2017-12-29 DIAGNOSIS — Z01818 Encounter for other preprocedural examination: Secondary | ICD-10-CM

## 2017-12-29 MED ORDER — LEVONORGESTREL 19.5 MCG/DAY IU IUD
INTRAUTERINE_SYSTEM | Freq: Once | INTRAUTERINE | Status: AC
  Administered 2017-12-29: 17:00:00 via INTRAUTERINE

## 2017-12-29 NOTE — Patient Instructions (Signed)

## 2017-12-29 NOTE — Progress Notes (Signed)
Post Partum Exam  Brittany Cordova is a 27 y.o. 582P2002 female who presents for a postpartum visit. She is 3 weeks postpartum following a spontaneous vaginal delivery(VBAC). I have fully reviewed the prenatal and intrapartum course. The delivery was at 38 gestational weeks.  Anesthesia: epidural. Postpartum course has been concerning . Baby's course has been unremarkable. Baby is feeding by breast. Bleeding bright red. Bowel function is normal. Bladder function is normal. Patient is not sexually active. Contraception method is IUD. Postpartum depression screening:neg I have reviewed the above information.  The following portions of the patient's history were reviewed and updated as appropriate: allergies, current medications, past family history, past medical history, past social history, past surgical history and problem list. Last pap smear done 06/2017 and was Normal  Review of Systems Pertinent items noted in HPI and remainder of comprehensive ROS otherwise negative.    Objective:  Blood pressure 130/83, pulse 83, resp. rate 16, height 5\' 3"  (1.6 m), weight 261 lb 9.6 oz (118.7 kg), currently breastfeeding.  General:  alert, cooperative and appears stated age  Lungs: nml effort  Heart:  regular rate and rhythm  Abdomen: soft, non-tender; bowel sounds normal; no masses,  no organomegaly   Vulva:  normal  Vagina: normal vagina  Cervix:  no lesions  Procedure: Patient identified, informed consent performed, signed copy in chart, time out was performed. Urine pregnancy test negative.  Speculum placed in the vagina. Cervix visualized. Grasped anteriourly with a single tooth tenaculum. Uterus sounded to 8 cm.  Mirena IUD placed per manufacturer's recommendations.  Strings trimmed to 3 cm.   Patient given post procedure instructions and Mirena care card with expiration date.  Patient is asked to check IUD strings periodically and follow up in 4-6 weeks for IUD check.       Assessment:     Nml postpartum exam. Pap smear not done at today's visit.   Plan:   1. Contraception: IUD 2. Pap WNL 06/2017 3. Follow up in: 4 weeks for IUD string check or as needed.

## 2018-01-02 ENCOUNTER — Ambulatory Visit: Payer: Medicaid Other | Admitting: Obstetrics and Gynecology

## 2018-01-29 ENCOUNTER — Encounter: Payer: Self-pay | Admitting: Obstetrics and Gynecology

## 2018-01-29 ENCOUNTER — Ambulatory Visit: Payer: Medicaid Other | Admitting: Obstetrics and Gynecology

## 2018-01-29 ENCOUNTER — Ambulatory Visit (HOSPITAL_COMMUNITY)
Admission: RE | Admit: 2018-01-29 | Discharge: 2018-01-29 | Disposition: A | Discharge status: Home / Self Care | Payer: Medicaid Other | Source: Ambulatory Visit | Attending: Obstetrics and Gynecology | Admitting: Obstetrics and Gynecology

## 2018-01-29 VITALS — BP 135/89 | HR 69 | Wt 262.8 lb

## 2018-01-29 DIAGNOSIS — Z30431 Encounter for routine checking of intrauterine contraceptive device: Secondary | ICD-10-CM

## 2018-01-29 NOTE — Progress Notes (Signed)
Patient here for IUD check. Patient had IUD inserted on 8/12. She reports pain form strings and having to push them back in the vagina daily. She has not been sexually active. She denies any vaginal bleeding  Past Medical History:  Diagnosis Date  . Allergic rhinitis, cause unspecified   . 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 Amenorrhea    on BC hormone inj  . Anemia, unspecified   . 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 Asthma    Heat induced  . Dermatitis due to food taken internally   . Hypertension   . Mass    heart  . Obstructive sleep apnea   . Ovarian cyst   . Vitamin D deficiency    Past Surgical History:  Procedure Laterality Date  . CESAREAN SECTION N/A 05/08/2016   Procedure: CESAREAN SECTION;  Surgeon: Federico FlakeKimberly Niles Newton, MD;  [facility];  Service: Obstetrics;  Laterality: N/A;  . WISDOM TOOTH EXTRACTION     Family History  Problem Relation Age of Onset  . Allergies Father        tomatoes  . Hypertension Father   . Asthma Mother   . Hypertension Mother   . Diabetes Mother   . Diabetes Other        grandmother  . Cancer Paternal Grandmother   . Diabetes Paternal Grandmother   . Cancer Maternal Uncle        [employer] Uncle  . Cancer Other        great aunts  . Diabetes Maternal Grandmother   . Heart disease Maternal Grandfather    Social History   Tobacco Use  . Smoking status: Former Smoker    Packs/day: 0.25    Years: 3.00    Pack years: 0.75    Types: Cigars    Last attempt to quit: 09/18/2015    Years since quitting: 2.3  . Smokeless tobacco: Never Used  Substance Use Topics  . Alcohol use: No  . Drug use: Not Currently    Types: Marijuana    Comment: not since found out preg   ROS See pertinent in HPI  Blood pressure 135/89, pulse 69, weight 262 lb 12.8 oz (119.2 kg), currently breastfeeding. GENERAL: Well-developed, well-nourished female in no acute distress.  ABDOMEN: Soft, nontender, nondistended. No organomegaly. PELVIC:  Normal external female genitalia. Vagina is pink and rugated.  Normal discharge. Normal appearing cervix with IUD strings extending approximately 6 cm out of os. Uterus is normal in size. No adnexal mass or tenderness. Bimanual limited secondary to body habitus EXTREMITIES: No cyanosis, clubbing, or edema, 2+ distal pulses.  A/P 27 yo here for IUD check - IUD strings trimmed to 2 cm - pelvic ultrasound ordered to assess appropriate location of IUD - patient will be contacted with results - Patient due to annual exam in february

## 2018-01-29 NOTE — Progress Notes (Signed)
Patient is in the office for IUD string check, inserted 12-29-17. Pt states that strings are too long.

## 2018-02-02 ENCOUNTER — Encounter: Payer: Self-pay | Admitting: Obstetrics and Gynecology

## 2018-02-02 ENCOUNTER — Ambulatory Visit (INDEPENDENT_AMBULATORY_CARE_PROVIDER_SITE_OTHER): Payer: Medicaid Other | Admitting: Obstetrics and Gynecology

## 2018-02-02 VITALS — BP 141/91 | HR 74

## 2018-02-02 DIAGNOSIS — Z3042 Encounter for surveillance of injectable contraceptive: Secondary | ICD-10-CM | POA: Diagnosis not present

## 2018-02-02 DIAGNOSIS — Z30432 Encounter for removal of intrauterine contraceptive device: Secondary | ICD-10-CM | POA: Diagnosis not present

## 2018-02-02 DIAGNOSIS — Z30013 Encounter for initial prescription of injectable contraceptive: Secondary | ICD-10-CM

## 2018-02-02 MED ORDER — MEDROXYPROGESTERONE ACETATE 150 MG/ML IM SUSP
150.0000 mg | Freq: Once | INTRAMUSCULAR | Status: AC
  Administered 2018-02-02: 150 mg via INTRAMUSCULAR

## 2018-02-02 MED ORDER — MEDROXYPROGESTERONE ACETATE 150 MG/ML IM SUSP: 150.0000 mg | INTRAMUSCULAR | 5 refills | Status: DC

## 2018-02-02 NOTE — Addendum Note (Signed)
Addended by: Marya LandryFOSTER, SUZANNE D on: 02/02/2018 03:01 PM   Modules accepted: Orders

## 2018-02-02 NOTE — Progress Notes (Signed)
Patient with malpositioned IUD here for IUD removal  GYNECOLOGY CLINIC PROCEDURE NOTE  Brittany Cordova is a 27 y.o. [phone] here for IUD removal. No GYN concerns.  Last pap smear was on 06/2017 and was normal.  IUD Removal  Patient identified, informed consent performed, consent signed.  Patient was in the dorsal lithotomy position, normal external genitalia was noted.  A speculum was placed in the patient's vagina, normal discharge was noted, no lesions. The cervix was visualized, no lesions, no abnormal discharge.  The strings of the IUD were grasped and pulled using ring forceps. The IUD was removed in its entirety. Patient tolerated the procedure well.    Patient will use depo-provera for contraception.  Routine preventative health maintenance measures emphasized.

## 2018-04-27 ENCOUNTER — Ambulatory Visit (INDEPENDENT_AMBULATORY_CARE_PROVIDER_SITE_OTHER): Payer: Medicaid Other | Admitting: *Deleted

## 2018-04-27 VITALS — BP 145/86 | HR 73 | Wt 266.0 lb

## 2018-04-27 DIAGNOSIS — Z3042 Encounter for surveillance of injectable contraceptive: Secondary | ICD-10-CM | POA: Diagnosis not present

## 2018-04-27 MED ORDER — MEDROXYPROGESTERONE ACETATE 150 MG/ML IM SUSP
150.0000 mg | Freq: Once | INTRAMUSCULAR | Status: AC
  Administered 2018-04-27: 150 mg via INTRAMUSCULAR

## 2018-04-27 NOTE — Progress Notes (Signed)
Pt is in office for depo injection today. Pt is on time for Depo.  Pt tolerated injection well. Pt advised to RTO on 2/25-3/11 for next injection. Pt has no other concerns.  BP (!) 145/86   Pulse 73   Wt 266 lb (120.7 kg)   BMI 47.12 kg/m   Administrations This Visit    medroxyPROGESTERone (DEPO-PROVERA) injection 150 mg    Admin Date 04/27/2018 Action Given Dose 150 mg Route Intramuscular Administered By Lanney GinsFoster, Shaune Malacara D, CMA

## 2018-07-20 ENCOUNTER — Ambulatory Visit (INDEPENDENT_AMBULATORY_CARE_PROVIDER_SITE_OTHER): Payer: Self-pay

## 2018-07-20 DIAGNOSIS — Z3042 Encounter for surveillance of injectable contraceptive: Secondary | ICD-10-CM

## 2018-07-20 MED ORDER — MEDROXYPROGESTERONE ACETATE 150 MG/ML IM SUSP
150.0000 mg | INTRAMUSCULAR | Status: AC
  Administered 2018-07-20 – 2018-12-31 (×3): 150 mg via INTRAMUSCULAR

## 2018-07-20 NOTE — Progress Notes (Signed)
I have reviewed the chart and agree with nursing staff's documentation of this patient's encounter.  Catalina Antigua, MD 07/20/2018 10:54 AM

## 2018-07-20 NOTE — Progress Notes (Signed)
Patient is in the office for depo injection, administered in R arm and pt tolerated well Next depo due 5/18 - 6/1 .Marland Kitchen Administrations This Visit    medroxyPROGESTERone (DEPO-PROVERA) injection 150 mg    Admin Date 07/20/2018 Action Given Dose 150 mg Route Intramuscular Administered By Katrina Stack, RN

## 2018-10-05 ENCOUNTER — Other Ambulatory Visit: Payer: Self-pay

## 2018-10-05 ENCOUNTER — Ambulatory Visit (INDEPENDENT_AMBULATORY_CARE_PROVIDER_SITE_OTHER): Payer: Self-pay

## 2018-10-05 DIAGNOSIS — Z3042 Encounter for surveillance of injectable contraceptive: Secondary | ICD-10-CM

## 2018-10-05 NOTE — Progress Notes (Signed)
Pt is in the office for depo injection, administered in L Del and pt tolerated well. Next due 8/3-8/17. .. Administrations This Visit    medroxyPROGESTERone (DEPO-PROVERA) injection 150 mg    Admin Date 10/05/2018 Action Given Dose 150 mg Route Intramuscular Administered By Stalling, Brittany D, RN         

## 2018-10-05 NOTE — Progress Notes (Signed)
I have reviewed the chart and agree with nursing staff's documentation of this patient's encounter.  Keyli Duross, MD 10/05/2018 11:09 AM    

## 2018-12-25 ENCOUNTER — Telehealth: Payer: Self-pay

## 2018-12-25 NOTE — Telephone Encounter (Signed)
Pt called wanting to know if she could have a nurse at her job give her depo inj to her instead of coming to the office paying out of pocket for the nurse visit. Informed patient that this was okay per [facility], and advised her to send [place] a my chart message when she received the inj so that we can have documentation. Pt agreed to do this. New my chart link will be sent to patient to set up.

## 2018-12-28 ENCOUNTER — Ambulatory Visit: Payer: Self-pay

## 2018-12-31 ENCOUNTER — Other Ambulatory Visit (INDEPENDENT_AMBULATORY_CARE_PROVIDER_SITE_OTHER): Payer: Self-pay

## 2018-12-31 DIAGNOSIS — Z3042 Encounter for surveillance of injectable contraceptive: Secondary | ICD-10-CM

## 2018-12-31 NOTE — Progress Notes (Signed)
Pt administered Depo herself at home approved by practice admin

## 2019-03-26 ENCOUNTER — Other Ambulatory Visit: Payer: Self-pay | Admitting: *Deleted

## 2019-03-26 DIAGNOSIS — Z3042 Encounter for surveillance of injectable contraceptive: Secondary | ICD-10-CM

## 2019-03-26 MED ORDER — MEDROXYPROGESTERONE ACETATE 150 MG/ML IM SUSP
150.0000 mg | INTRAMUSCULAR | 0 refills | Status: DC
Start: 2019-03-26 — End: 2019-06-16

## 2019-03-26 NOTE — Progress Notes (Signed)
Refill request for Depo injection received from pharmacy.  1 time refill was sent per protocol, advised will need AEX to continue refills noted on Rx.

## 2019-03-31 ENCOUNTER — Telehealth: Payer: Self-pay | Admitting: *Deleted

## 2019-03-31 NOTE — Telephone Encounter (Signed)
Patient called stating she needed a needle and syringe to have a nurse at her job administer Depo Provera. A note from 12/31/2018; ok for patient to self administer Depo by practice admin. Pt requested that a prescription be sent to pharmacy. Advised patient that a prescription will not be sent to pharmacy for next Depo Provera injection. She will need to have an annual exam and renew order for Depo Provera. A 3 ml 22Gx1 1/5 syringe/needle combo left up front for patient to pick up. Last day to receive Depo Provera injection is tomorrow 04/01/2019.  Derl Barrow, RN

## 2019-06-14 ENCOUNTER — Other Ambulatory Visit: Payer: Self-pay | Admitting: *Deleted

## 2019-06-14 ENCOUNTER — Other Ambulatory Visit: Payer: Self-pay

## 2019-06-14 DIAGNOSIS — Z124 Encounter for screening for malignant neoplasm of cervix: Secondary | ICD-10-CM

## 2019-06-14 NOTE — Progress Notes (Signed)
Patient: Brittany Cordova           Date of Birth: [birth year]           MRN: [id number] Visit Date: 06/14/2019 PCP: Default, Provider, MD Temp: 98.0 Temporal   Cervical Exam Normal Exam. Per patient has a history of an abnormal 10 years ago that a repeat Pap smear was completed for follow-up that was normal. Patient stated she has had at least three normal Pap smears since abnormal. If today's Pap smear is normal next Pap smear is due in three years.  Patient's History Patient Active Problem List   Diagnosis Date Noted  . Morbid obesity (HCC) 12/29/2017  . Hypertension 12/02/2017  . Obstructive sleep apnea 11/01/2010   Past Medical History:  Diagnosis Date  . Allergic rhinitis, cause unspecified   . 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 Amenorrhea    on BC hormone inj  . Anemia, unspecified   . 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 Asthma    Heat induced  . Dermatitis due to food taken internally   . Hypertension   . Mass    heart  . Obstructive sleep apnea   . Ovarian cyst   . Vitamin D deficiency     Family History  Problem Relation Age of Onset  . Allergies Father        tomatoes  . Hypertension Father   . Asthma Mother   . Hypertension Mother   . Diabetes Mother   . Diabetes Other        grandmother  . Cancer Paternal Grandmother   . Diabetes Paternal Grandmother   . Cancer Maternal Uncle        [employer]  . Cancer Other        great aunts  . Diabetes Maternal Grandmother   . Heart disease Maternal Grandfather     Social History   Occupational History  . Occupation: Designer, industrial/product: UNEMPLOYED  Tobacco Use  . Smoking status: Former Smoker    Packs/day: 0.25    Years: 3.00    Pack years: 0.75    Types: Cigars    Quit date: 09/18/2015    Years since quitting: 3.7  . Smokeless tobacco: Never Used  Substance and Sexual Activity  . Alcohol use: No  . Drug use: Not Currently    Types: Marijuana    Comment: not since found out preg  . Sexual  activity: Yes    Birth control/protection: None

## 2019-06-16 ENCOUNTER — Other Ambulatory Visit: Payer: Self-pay

## 2019-06-16 DIAGNOSIS — Z3042 Encounter for surveillance of injectable contraceptive: Secondary | ICD-10-CM

## 2019-06-16 LAB — CYTOLOGY - PAP: Diagnosis: NEGATIVE

## 2019-06-17 MED ORDER — METRONIDAZOLE 500 MG PO TABS: 500.0000 mg | ORAL_TABLET | Freq: Two times a day (BID) | ORAL | 0 refills | Status: DC

## 2019-06-17 NOTE — Addendum Note (Signed)
Addended by: Catalina Antigua on: 06/17/2019 11:06 AM   Modules accepted: Orders

## 2019-06-18 ENCOUNTER — Telehealth (HOSPITAL_COMMUNITY): Payer: Self-pay

## 2019-06-18 NOTE — Telephone Encounter (Signed)
Patient informed Pap test results negative. Patient was also informed of positive trichomoniasis results. Discussed treatment plan with patient, avoid alcohol during treatment, needs to complete 7 day treatment, abstain from sex x 7 days until treatment is complete and partner also needs to be treated by their PCP or can go to the local health department.  Flagyl prescribed and sent pharmacy ([facility]) per Dr. Jolayne Panther. Patient understands results and treatment plan.

## 2019-06-19 ENCOUNTER — Other Ambulatory Visit: Payer: Self-pay | Admitting: Obstetrics and Gynecology

## 2019-06-19 DIAGNOSIS — Z3042 Encounter for surveillance of injectable contraceptive: Secondary | ICD-10-CM

## 2019-06-21 MED ORDER — MEDROXYPROGESTERONE ACETATE 150 MG/ML IM SUSP: 150.0000 mg | INTRAMUSCULAR | 0 refills | Status: DC

## 2019-09-02 ENCOUNTER — Emergency Department (HOSPITAL_COMMUNITY)
Admission: EM | Admit: 2019-09-02 | Discharge: 2019-09-02 | Disposition: A | Discharge status: Home / Self Care | Payer: 59 | Attending: Emergency Medicine | Admitting: Emergency Medicine

## 2019-09-02 ENCOUNTER — Ambulatory Visit (INDEPENDENT_AMBULATORY_CARE_PROVIDER_SITE_OTHER)
Admission: EM | Admit: 2019-09-02 | Discharge: 2019-09-02 | Disposition: A | Discharge status: Home / Self Care | Payer: 59 | Source: Home / Self Care

## 2019-09-02 ENCOUNTER — Encounter (HOSPITAL_COMMUNITY): Payer: Self-pay

## 2019-09-02 ENCOUNTER — Other Ambulatory Visit: Payer: Self-pay

## 2019-09-02 DIAGNOSIS — Z79899 Other long term (current) drug therapy: Secondary | ICD-10-CM | POA: Diagnosis not present

## 2019-09-02 DIAGNOSIS — I10 Essential (primary) hypertension: Secondary | ICD-10-CM | POA: Diagnosis not present

## 2019-09-02 DIAGNOSIS — Z91018 Allergy to other foods: Secondary | ICD-10-CM | POA: Insufficient documentation

## 2019-09-02 DIAGNOSIS — Z886 Allergy status to analgesic agent status: Secondary | ICD-10-CM | POA: Insufficient documentation

## 2019-09-02 DIAGNOSIS — R42 Dizziness and giddiness: Secondary | ICD-10-CM | POA: Insufficient documentation

## 2019-09-02 DIAGNOSIS — R519 Headache, unspecified: Secondary | ICD-10-CM | POA: Insufficient documentation

## 2019-09-02 DIAGNOSIS — H538 Other visual disturbances: Secondary | ICD-10-CM | POA: Diagnosis not present

## 2019-09-02 DIAGNOSIS — Z87891 Personal history of nicotine dependence: Secondary | ICD-10-CM | POA: Diagnosis not present

## 2019-09-02 LAB — CBC
HCT: 38.2 % (ref 36.0–46.0)
Hemoglobin: 11.5 g/dL — ABNORMAL LOW (ref 12.0–15.0)
MCH: 23.7 pg — ABNORMAL LOW (ref 26.0–34.0)
MCHC: 30.1 g/dL (ref 30.0–36.0)
MCV: 78.6 fL — ABNORMAL LOW (ref 80.0–100.0)
Platelets: 275 10*3/uL (ref 150–400)
RBC: 4.86 MIL/uL (ref 3.87–5.11)
RDW: 16.6 % — ABNORMAL HIGH (ref 11.5–15.5)
WBC: 10.3 10*3/uL (ref 4.0–10.5)
nRBC: 0 % (ref 0.0–0.2)

## 2019-09-02 LAB — BASIC METABOLIC PANEL
Anion gap: 11 (ref 5–15)
BUN: 9 mg/dL (ref 6–20)
CO2: 23 mmol/L (ref 22–32)
Calcium: 9 mg/dL (ref 8.9–10.3)
Chloride: 108 mmol/L (ref 98–111)
Creatinine, Ser: 0.78 mg/dL (ref 0.44–1.00)
GFR calc Af Amer: >60 mL/min (ref >60)
GFR calc non Af Amer: >60 mL/min (ref >60)
Glucose, Bld: 106 mg/dL — ABNORMAL HIGH (ref 70–99)
Potassium: 3.6 mmol/L (ref 3.5–5.1)
Sodium: 142 mmol/L (ref 135–145)

## 2019-09-02 LAB — URINALYSIS, ROUTINE W REFLEX MICROSCOPIC
Bilirubin Urine: NEGATIVE
Glucose, UA: NEGATIVE mg/dL
Hgb urine dipstick: NEGATIVE
Ketones, ur: NEGATIVE mg/dL
Leukocytes,Ua: NEGATIVE
Nitrite: NEGATIVE
Protein, ur: 30 mg/dL — AB
Specific Gravity, Urine: 1.029 (ref 1.005–1.030)
pH: 6 (ref 5.0–8.0)

## 2019-09-02 LAB — I-STAT BETA HCG BLOOD, ED (MC, WL, AP ONLY): I-stat hCG, quantitative: <5 m[IU]/mL (ref <5)

## 2019-09-02 MED ORDER — OXYMETAZOLINE HCL 0.05 % NA SOLN: 1.0000 | Freq: Once | NASAL | Status: DC

## 2019-09-02 MED ORDER — SODIUM CHLORIDE 0.9% FLUSH: 3.0000 mL | Freq: Once | INTRAVENOUS | Status: DC

## 2019-09-02 NOTE — ED Notes (Signed)
Patient is being discharged from the Urgent Care Center and sent to the Emergency Department via wheelchair by staff. Per Cam Hai, PA, patient is stable but in need of higher level of care due to severe headache, hypertension and tachycardia. Patient is aware and verbalizes understanding of plan of care.  Vitals:   09/02/19 1631  BP: (!) 163/99  Pulse: (!) 110  Resp: 16  Temp: 98 F (36.7 C)  SpO2: 100%

## 2019-09-02 NOTE — Discharge Instructions (Signed)
Please report to the [facility] Emergency Department for further evaluation of your severe headache and symptoms

## 2019-09-02 NOTE — ED Notes (Signed)
Pt told this tech she could not wait in the waiting room any longer because of [place] calling for vitals, and the lights. Pt was offered a blanket to put over head for lights. Pt became agitated and told family member on the phone to come pick her up. Pt was encouraged to stay. Pt was seen getting wheeled out by family member.

## 2019-09-02 NOTE — ED Triage Notes (Signed)
Headache and dizziness since yesterday lost vision temporary. Nose started bleeding last night for 10 minutes.

## 2019-09-02 NOTE — ED Provider Notes (Signed)
[facility]    CSN: [id number] Arrival date & time: 09/02/19  1557      History   Chief Complaint Chief Complaint  Patient presents with  . Dizziness  . Headache    HPI Brittany Cordova is a 29 y.o. female.   Patient with past medical history of hypertension presents for evaluation of severe headache, dizziness and blurred vision.  She reports starting yesterday afternoon she began having a severe headache.  She had accompanied dizziness sensation and blurred vision.  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  She reports she was doing anything strenuous had a fairly sudden onset of headache.  She reports a vague sensation of the room spinning and mild nausea however no vomiting.  She then began having blurred vision and inability to focus.  She reports not being able to place certain objects on patient's trays properly.  She also reports the onset of the symptoms she had a nosebleed.  She reports it took several towels to get the nosebleed to stop.  Her headache, dizziness and blurred vision of persisted until today.  The headache has not improved and she is continuing to endorse a severe headache.  She denies any change in speech, numbness or weakness.  Denies chest pain or shortness of breath.  Denies ringing in the ears or hearing loss.  Patient does not have a history of migraine headache or severe headache such as this.  She does classify this as one of the worst headache she has ever had.  Denies history of vertigo.  Denies trauma to the head recently or historically.  Patient reports she drove to urgent care today however had to squint frequently in order to see.     Past Medical History:  Diagnosis Date  . Allergic rhinitis, cause unspecified   . 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 Amenorrhea    on BC hormone inj  . Anemia, unspecified   . 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 Asthma    Heat induced  . Dermatitis due to food  taken internally   . Hypertension   . Mass    heart  . Obstructive sleep apnea   . Ovarian cyst   . Vitamin D deficiency     Patient Active Problem List   Diagnosis Date Noted  . Morbid obesity (HCC) 12/29/2017  . Hypertension 12/02/2017  . Obstructive sleep apnea 11/01/2010    Past Surgical History:  Procedure Laterality Date  . CESAREAN SECTION N/A 05/08/2016   Procedure: CESAREAN SECTION;  Surgeon: Federico Flake, MD;  Location: [facility];  Service: Obstetrics;  Laterality: N/A;  . WISDOM TOOTH EXTRACTION      OB History    Gravida  2   Para  2   Term  2   Preterm  0   AB  0   Living  2     SAB  0   TAB  0   Ectopic  0   Multiple  0   Live Births  2            Home Medications    Prior to Admission medications   Medication Sig Start Date End Date Taking? Authorizing Provider  acetaminophen (TYLENOL) 325 MG tablet Take 2 tablets (650 mg total) by mouth every 4 (four) hours as needed (for pain scale < 4). 12/06/17  Yes Arabella Merles, CNM  Cholecalciferol (VITAMIN D) 2000  units CAPS Take 1 capsule (2,000 Units total) by mouth daily before breakfast. Patient not taking: Reported on 12/29/2017 07/02/17   Brock Bad, MD  medroxyPROGESTERone (DEPO-PROVERA) 150 MG/ML injection Inject 1 mL (150 mg total) into the muscle every 3 (three) months. 06/21/19   Constant, Peggy, MD  metroNIDAZOLE (FLAGYL) 500 MG tablet Take 1 tablet (500 mg total) by mouth 2 (two) times daily. 06/17/19   Constant, Peggy, MD  NIFEdipine (PROCARDIA-XL/ADALAT CC) 30 MG 24 hr tablet Take 1 tablet (30 mg total) by mouth daily. Patient not taking: Reported on 01/29/2018 12/06/17   Arabella Merles, CNM  Prenatal Vit-Fe Phos-FA-Omega (VITAFOL GUMMIES) 3.33-0.333-34.8 MG CHEW Chew 3 tablets by mouth daily before breakfast. Patient not taking: Reported on 01/29/2018 06/30/17   Brock Bad, MD    Family History Family History  Problem Relation Age of Onset  .  Allergies Father        tomatoes  . Hypertension Father   . Asthma Mother   . Hypertension Mother   . Diabetes Mother   . Diabetes Other        grandmother  . Cancer Paternal Grandmother   . Diabetes Paternal Grandmother   . Cancer Maternal Uncle        [employer]  . Cancer Other        great aunts  . Diabetes Maternal Grandmother   . Heart disease Maternal Grandfather     Social History Social History   Tobacco Use  . Smoking status: Former Smoker    Packs/day: 0.25    Years: 3.00    Pack years: 0.75    Types: Cigars    Quit date: 09/18/2015    Years since quitting: 3.9  . Smokeless tobacco: Never Used  Substance Use Topics  . Alcohol use: No  . Drug use: Not Currently    Types: Marijuana    Comment: not since found out preg     Allergies   Ibuprofen and Tomato   Review of Systems Review of Systems  Per HPI Physical Exam Triage Vital Signs ED Triage Vitals [09/02/19 1631]  Enc Vitals Group     BP (!) 163/99     Pulse Rate (!) 110     Resp 16     Temp 98 F (36.7 C)     Temp src      SpO2 100 %     Weight      Height      Head Circumference      Peak Flow      Pain Score      Pain Loc      Pain Edu?      Excl. in GC?    No data found.  Updated Vital Signs BP (!) 163/99   Pulse (!) 110   Temp 98 F (36.7 C)   Resp 16   LMP  (LMP Unknown)   SpO2 100%   Visual Acuity Right Eye Distance: 20/50 Left Eye Distance: 20/50 Bilateral Distance: 20/40  Right Eye Near:   Left Eye Near:    Bilateral Near:     Physical Exam Vitals and nursing note reviewed.  Constitutional:      General: She is not in acute distress.    Appearance: She is well-developed. She is ill-appearing.  HENT:     Head: Normocephalic and atraumatic.  Eyes:     General: No visual field deficit.    Extraocular Movements: Extraocular movements intact.  Conjunctiva/sclera: Conjunctivae normal.     Pupils: Pupils are equal, round, and reactive to light.    Cardiovascular:     Rate and Rhythm: Normal rate and regular rhythm.     Heart sounds: No murmur.  Pulmonary:     Effort: Pulmonary effort is normal. No respiratory distress.     Breath sounds: Normal breath sounds.  Abdominal:     Palpations: Abdomen is soft.     Tenderness: There is no abdominal tenderness.  Musculoskeletal:     Cervical back: Neck supple.  Skin:    General: Skin is warm and dry.  Neurological:     Mental Status: She is alert and oriented to person, place, and time.     GCS: GCS eye subscore is 4. GCS verbal subscore is 5. GCS motor subscore is 6.     Cranial Nerves: No dysarthria or facial asymmetry.     Deep Tendon Reflexes: Reflexes normal.     Comments: Cranial nerve exam: Cranial nerve I not tested.   Cranial nerve II : Reported blurred vision Cranial nerve III, IV, VI: Intact to confrontation, pupils equal and reactive Cranial nerve V: Muscles of mastication equal bilaterally.  There is some reported diminished sensation on the right side of the face compared to left Cranial nerve VII: Facial expressions equal bilaterally no facial droop. Cranial nerve VIII: Hearing grossly intact Cranial nerve IX, X: Uvula midline soft palate raises equally Cranial nerve XI: Possible weakness on the right side versus left with shoulder shrug   Strength: Some diminished grip on the right versus left, flexion at the elbow grossly equal, extension of the elbow grossly equal.  Lower extremities strength equal bilaterally  Sensation: Subjective diminished sensation in right extremities for his left, though patient reports ability to feel with light touch.  Cerebellar function: Finger-to-nose with some difficulty focusing 1 finger however able to move finger back-and-forth slowly. Rapid alternating movements intact with hand flipping and heel shin  Gait: Balance and easy unassisted both to exam table and wheelchair.      UC Treatments / Results  Labs (all labs ordered  are listed, but only abnormal results are displayed) Labs Reviewed - No data to display  EKG   Radiology No results found.  Procedures Procedures (including critical care time)  Medications Ordered in UC Medications - No data to display  Initial Impression / Assessment and Plan / UC Course  I have reviewed the triage vital signs and the nursing notes.  Pertinent labs & imaging results that were available during my care of the patient were reviewed by me and considered in my medical decision making (see chart for details).     #Severe headache #Dizziness #Blurred vision Patient is 29 year old presenting with severe headache with associated neurologic symptoms.  Given no history of headache or similar presenting headaches discussed that patient should be further evaluated in the emergency department in order to rule out more serious issues.  Differential would include vertiginous migraine vs subarachnoid hemorrhage vs stroke.  Given patient's age is more likely she is experiencing a vertiginous migraine however we cannot fully rule out more insidious pathology here.  Patient was escorted to the emergency department by clinical staff where she will be further evaluated.   Final Clinical Impressions(s) / UC Diagnoses   Final diagnoses:  Dizziness  Blurred vision  Severe headache     Discharge Instructions     Please report to the [facility] Emergency Department for further evaluation of your  severe headache and symptoms    ED Prescriptions    None     PDMP not reviewed this encounter.   Hermelinda Medicus, PA-C 09/02/19 [phone]

## 2019-09-02 NOTE — ED Triage Notes (Signed)
Pt sent from UC for c/o headache, blurred vision, and dizziness. Pt AOx4 in triage, neuro intact.

## 2019-09-02 NOTE — ED Triage Notes (Signed)
Pt c/o headache and dizziness since yesterday. Reports episode of sudden onset of dizziness and nose bleed yesterday evening. Tylenol not helping with headache. Pt also c/o blurred vision.

## 2019-09-03 ENCOUNTER — Emergency Department (HOSPITAL_COMMUNITY): Payer: 59

## 2019-09-03 ENCOUNTER — Emergency Department (HOSPITAL_COMMUNITY)
Admission: EM | Admit: 2019-09-03 | Discharge: 2019-09-03 | Disposition: A | Discharge status: Home / Self Care | Payer: 59 | Source: Home / Self Care | Attending: Emergency Medicine | Admitting: Emergency Medicine

## 2019-09-03 DIAGNOSIS — D509 Iron deficiency anemia, unspecified: Secondary | ICD-10-CM

## 2019-09-03 DIAGNOSIS — G43009 Migraine without aura, not intractable, without status migrainosus: Secondary | ICD-10-CM

## 2019-09-03 LAB — CBC WITH DIFFERENTIAL/PLATELET
Abs Immature Granulocytes: 0.04 10*3/uL (ref 0.00–0.07)
Basophils Absolute: 0 10*3/uL (ref 0.0–0.1)
Basophils Relative: 0 %
Eosinophils Absolute: 0.2 10*3/uL (ref 0.0–0.5)
Eosinophils Relative: 2 %
HCT: 38.8 % (ref 36.0–46.0)
Hemoglobin: 11.6 g/dL — ABNORMAL LOW (ref 12.0–15.0)
Immature Granulocytes: 0 %
Lymphocytes Relative: 45 %
Lymphs Abs: 4.1 10*3/uL — ABNORMAL HIGH (ref 0.7–4.0)
MCH: 23.2 pg — ABNORMAL LOW (ref 26.0–34.0)
MCHC: 29.9 g/dL — ABNORMAL LOW (ref 30.0–36.0)
MCV: 77.6 fL — ABNORMAL LOW (ref 80.0–100.0)
Monocytes Absolute: 0.4 10*3/uL (ref 0.1–1.0)
Monocytes Relative: 4 %
Neutro Abs: 4.4 10*3/uL (ref 1.7–7.7)
Neutrophils Relative %: 49 %
Platelets: 256 10*3/uL (ref 150–400)
RBC: 5 MIL/uL (ref 3.87–5.11)
RDW: 16.5 % — ABNORMAL HIGH (ref 11.5–15.5)
WBC: 9.2 10*3/uL (ref 4.0–10.5)
nRBC: 0 % (ref 0.0–0.2)

## 2019-09-03 LAB — I-STAT BETA HCG BLOOD, ED (MC, WL, AP ONLY): I-stat hCG, quantitative: <5 m[IU]/mL (ref <5)

## 2019-09-03 LAB — BASIC METABOLIC PANEL
Anion gap: 7 (ref 5–15)
BUN: 11 mg/dL (ref 6–20)
CO2: 24 mmol/L (ref 22–32)
Calcium: 8.9 mg/dL (ref 8.9–10.3)
Chloride: 110 mmol/L (ref 98–111)
Creatinine, Ser: 0.84 mg/dL (ref 0.44–1.00)
GFR calc Af Amer: >60 mL/min (ref >60)
GFR calc non Af Amer: >60 mL/min (ref >60)
Glucose, Bld: 91 mg/dL (ref 70–99)
Potassium: 3.8 mmol/L (ref 3.5–5.1)
Sodium: 141 mmol/L (ref 135–145)

## 2019-09-03 MED ORDER — SODIUM CHLORIDE 0.9 % IV BOLUS
1000.0000 mL | Freq: Once | INTRAVENOUS | Status: AC
  Administered 2019-09-03: 05:00:00 1000 mL via INTRAVENOUS

## 2019-09-03 MED ORDER — PROCHLORPERAZINE EDISYLATE 10 MG/2ML IJ SOLN
10.0000 mg | Freq: Once | INTRAMUSCULAR | Status: AC
  Administered 2019-09-03: 04:00:00 10 mg via INTRAVENOUS
  Filled 2019-09-03: qty 2

## 2019-09-03 MED ORDER — DIPHENHYDRAMINE HCL 50 MG/ML IJ SOLN
25.0000 mg | Freq: Once | INTRAMUSCULAR | Status: AC
  Administered 2019-09-03: 04:00:00 25 mg via INTRAVENOUS
  Filled 2019-09-03: qty 1

## 2019-09-03 MED ORDER — DEXAMETHASONE SODIUM PHOSPHATE 10 MG/ML IJ SOLN
10.0000 mg | Freq: Once | INTRAMUSCULAR | Status: AC
  Administered 2019-09-03: 05:00:00 10 mg via INTRAVENOUS
  Filled 2019-09-03: qty 1

## 2019-09-03 NOTE — Discharge Instructions (Signed)
Return if you are having any problems. 

## 2019-09-03 NOTE — ED Notes (Signed)
Pt refused visual acuity, pt said "Nope I ain't doing that"

## 2019-09-03 NOTE — ED Notes (Signed)
Patient states that she does not want fluid and wants to go home because she miss her kids.

## 2019-09-03 NOTE — ED Provider Notes (Signed)
[facility]-EMERGENCY DEPT Provider Note   CSN: [id number] Arrival date & time: 09/02/19  2132   History Chief Complaint  Patient presents with  . Headache  . Dizziness    Brittany Cordova is a 29 y.o. female.  The history is provided by the patient.  Headache Associated symptoms: dizziness   Dizziness Associated symptoms: headaches   She has history of hypertension and comes in because of headache.  She is complaining of a throbbing headache which started 2 days ago.  Pain came on gradually and build up over approximately 1 hour.  It is worst in the frontal area, especially the right frontal area.  She has had some blurring of her vision.  There has been some nausea but no vomiting.  She has noted photophobia and phonophobia.  She does not recall having headaches like this in the past.  There is a family history of migraines, and also a family history of cerebral aneurysms.  She denies any weakness or numbness.  She has not taken anything for pain.  She did go to urgent care and was referred to the emergency department.  She rates her pain at 10/10.  Past Medical History:  Diagnosis Date  . Allergic rhinitis, cause unspecified   . 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 Amenorrhea    on BC hormone inj  . Anemia, unspecified   . 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 Asthma    Heat induced  . Dermatitis due to food taken internally   . Hypertension   . Mass    heart  . Obstructive sleep apnea   . Ovarian cyst   . Vitamin D deficiency     Patient Active Problem List   Diagnosis Date Noted  . Morbid obesity (HCC) 12/29/2017  . Hypertension 12/02/2017  . Obstructive sleep apnea 11/01/2010    Past Surgical History:  Procedure Laterality Date  . CESAREAN SECTION N/A 05/08/2016   Procedure: CESAREAN SECTION;  Surgeon: Federico Flake, MD;  Location: [facility] BIRTHING SUITES;  Service: Obstetrics;  Laterality: N/A;  . WISDOM TOOTH EXTRACTION       OB  History    Gravida  2   Para  2   Term  2   Preterm  0   AB  0   Living  2     SAB  0   TAB  0   Ectopic  0   Multiple  0   Live Births  2           Family History  Problem Relation Age of Onset  . Allergies Father        tomatoes  . Hypertension Father   . Asthma Mother   . Hypertension Mother   . Diabetes Mother   . Diabetes Other        grandmother  . Cancer Paternal Grandmother   . Diabetes Paternal Grandmother   . Cancer Maternal Uncle        [employer]  . Cancer Other        great aunts  . Diabetes Maternal Grandmother   . Heart disease Maternal Grandfather     Social History   Tobacco Use  . Smoking status: Former Smoker    Packs/day: 0.25    Years: 3.00    Pack years: 0.75    Types: Cigars    Quit date: 09/18/2015    Years since quitting: 3.9  . Smokeless tobacco: Never Used  Substance Use Topics  . Alcohol use: No  . Drug use: Not Currently    Types: Marijuana    Comment: not since found out preg    Home Medications Prior to Admission medications   Medication Sig Start Date End Date Taking? Authorizing Provider  acetaminophen (TYLENOL) 325 MG tablet Take 2 tablets (650 mg total) by mouth every 4 (four) hours as needed (for pain scale < 4). Patient not taking: Reported on 09/03/2019 12/06/17   Arabella Merles, CNM  Cholecalciferol (VITAMIN D) 2000 units CAPS Take 1 capsule (2,000 Units total) by mouth daily before breakfast. Patient not taking: Reported on 12/29/2017 07/02/17   Brock Bad, MD  medroxyPROGESTERone (DEPO-PROVERA) 150 MG/ML injection Inject 1 mL (150 mg total) into the muscle every 3 (three) months. 06/21/19   Constant, Peggy, MD  metroNIDAZOLE (FLAGYL) 500 MG tablet Take 1 tablet (500 mg total) by mouth 2 (two) times daily. Patient not taking: Reported on 09/03/2019 06/17/19   Constant, Peggy, MD  NIFEdipine (PROCARDIA-XL/ADALAT CC) 30 MG 24 hr tablet Take 1 tablet (30 mg total) by mouth daily. Patient not taking:  Reported on 01/29/2018 12/06/17   Arabella Merles, CNM  Prenatal Vit-Fe Phos-FA-Omega (VITAFOL GUMMIES) 3.33-0.333-34.8 MG CHEW Chew 3 tablets by mouth daily before breakfast. Patient not taking: Reported on 01/29/2018 06/30/17   Brock Bad, MD    Allergies    Ibuprofen and Tomato  Review of Systems   Review of Systems  Neurological: Positive for dizziness and headaches.  All other systems reviewed and are negative.   Physical Exam Updated Vital Signs BP (!) 153/84 (BP Location: Left Arm)   Pulse 72   Temp 98.4 F (36.9 C) (Oral)   Resp 16   Ht 5\' 3"  (1.6 m)   Wt 99.8 kg   LMP  (LMP Unknown)   SpO2 100%   BMI 38.97 kg/m   Physical Exam Vitals and nursing note reviewed.   Morbidly obese 30 year old female, resting comfortably and in no acute distress. Vital signs are significant for elevated blood pressure. Oxygen saturation is 100%, which is normal. Head is normocephalic and atraumatic. PERRLA, EOMI. Oropharynx is clear.  Fundi show no hemorrhage, exudate, papilledema. Neck is nontender and supple without adenopathy or JVD. Back is nontender and there is no CVA tenderness. Lungs are clear without rales, wheezes, or rhonchi. Chest is nontender. Heart has regular rate and rhythm without murmur. Abdomen is soft, flat, nontender without masses or hepatosplenomegaly and peristalsis is normoactive. Extremities have no cyanosis or edema, full range of motion is present. Skin is warm and dry without rash. Neurologic: Mental status is normal, cranial nerves are intact, there are no motor or sensory deficits.  There is no pronator drift.  There is no extinction on double simultaneous stimulation.  ED Results / Procedures / Treatments   Labs (all labs ordered are listed, but only abnormal results are displayed) Labs Reviewed  CBC WITH DIFFERENTIAL/PLATELET - Abnormal; Notable for the following components:      Result Value   Hemoglobin 11.6 (*)    MCV 77.6 (*)    MCH 23.2  (*)    MCHC 29.9 (*)    RDW 16.5 (*)    Lymphs Abs 4.1 (*)    All other components within normal limits  BASIC METABOLIC PANEL  I-STAT BETA HCG BLOOD, ED ([facility], [facility], [facility] ONLY)   Radiology CT Head Wo Contrast  Result Date: 09/03/2019 CLINICAL DATA:  Headache, acute. EXAM: CT  HEAD WITHOUT CONTRAST TECHNIQUE: Contiguous axial images were obtained from the base of the skull through the vertex without intravenous contrast. COMPARISON:  03/18/2010 FINDINGS: Brain: No evidence of acute infarction, hemorrhage, hydrocephalus, extra-axial collection or mass lesion/mass effect. Vascular: No hyperdense vessel or unexpected calcification. Skull: Normal. Negative for fracture or focal lesion. Sinuses/Orbits: No acute finding. IMPRESSION: Negative head CT. Electronically Signed   By: Monte Fantasia M.D.   On: 09/03/2019 04:10    Procedures Procedures   Medications Ordered in ED Medications  oxymetazoline (AFRIN) 0.05 % nasal spray 1 spray (has no administration in time range)    ED Course  I have reviewed the triage vital signs and the nursing notes.  Pertinent labs & imaging results that were available during my care of the patient were reviewed by me and considered in my medical decision making (see chart for details).  MDM Rules/Calculators/A&P Headache with characteristics suggestive of migraine headache.  Old records are reviewed confirming urgent care visit yesterday, and no prior visits for headaches.  She has no red flags to suggest serious pathology, but given family history of cerebral aneurysm, will send for CT of head.  She will also be given a migraine cocktail of normal saline, prochlorperazine, diphenhydramine and reassessed.  We will also check visual acuity.  Patient actually refused visual acuity.  CT of head is normal.  Labs show mild anemia.  She has refused IV fluids, but did receive the medications and states headache is significantly improved.  She is given a dose of dexamethasone  and discharged.  Final Clinical Impression(s) / ED Diagnoses Final diagnoses:  Migraine without aura and without status migrainosus, not intractable  Microcytic anemia    Rx / DC Orders ED Discharge Orders    None       Delora Fuel, MD 02[birth year] [phone]

## 2019-09-10 ENCOUNTER — Other Ambulatory Visit: Payer: Self-pay | Admitting: Obstetrics and Gynecology

## 2019-09-10 DIAGNOSIS — Z3042 Encounter for surveillance of injectable contraceptive: Secondary | ICD-10-CM

## 2019-09-14 ENCOUNTER — Other Ambulatory Visit: Payer: Self-pay

## 2019-09-14 DIAGNOSIS — Z3042 Encounter for surveillance of injectable contraceptive: Secondary | ICD-10-CM

## 2019-09-14 MED ORDER — MEDROXYPROGESTERONE ACETATE 150 MG/ML IM SUSP: 150.0000 mg | INTRAMUSCULAR | 0 refills | Status: DC

## 2019-11-15 IMAGING — US US MFM FETAL BPP W/O NON-STRESS
1 series · 12 of 21 positions shown · non-contrast
Comparison: none

[Series 1: us mfm fetal bpp w/o non-stress · 21 acquisitions, 12 frames shown]
[im 1/21]
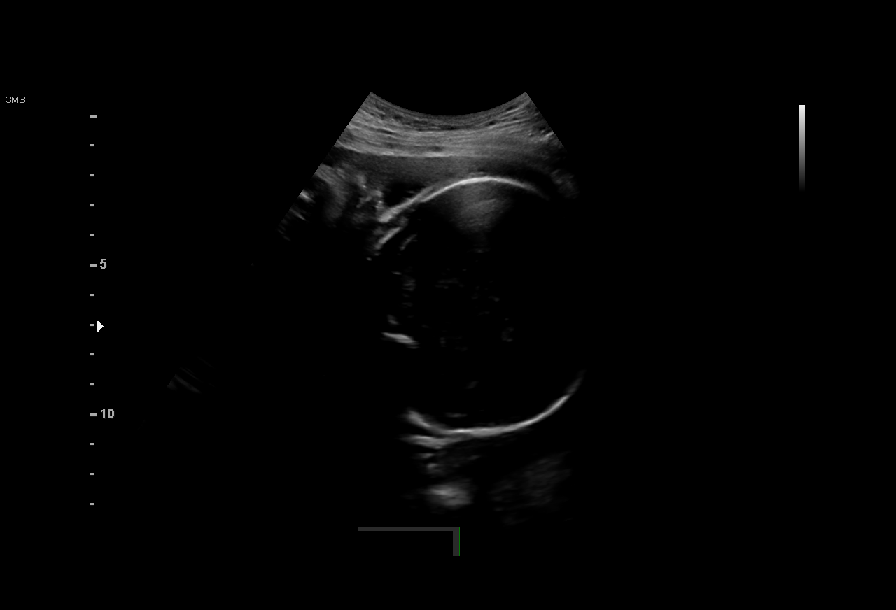
[im 3/21]
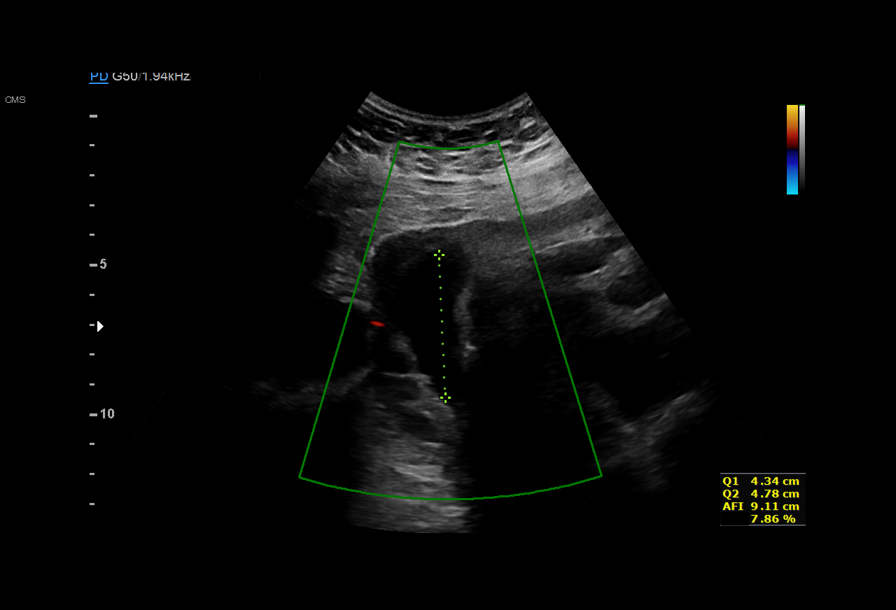
[im 5/21]
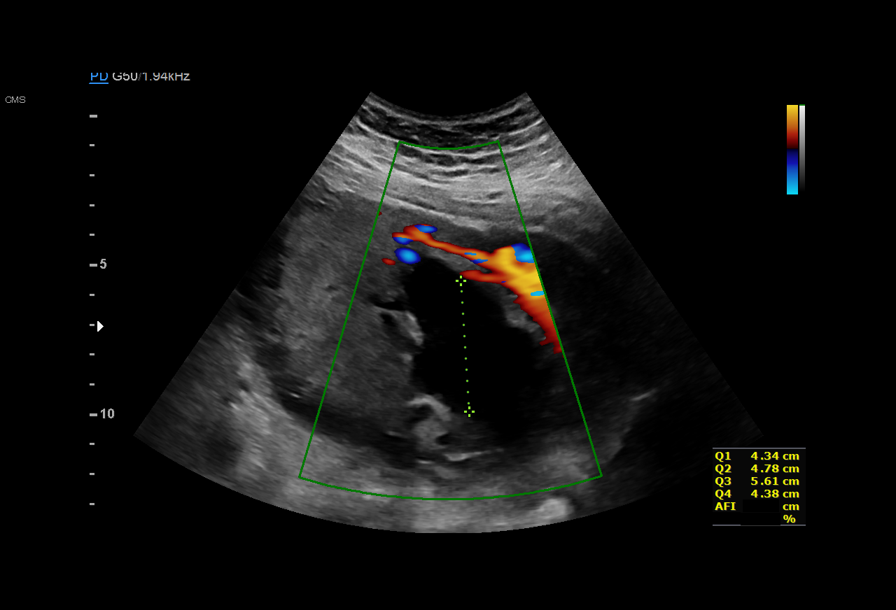
[im 7/21]
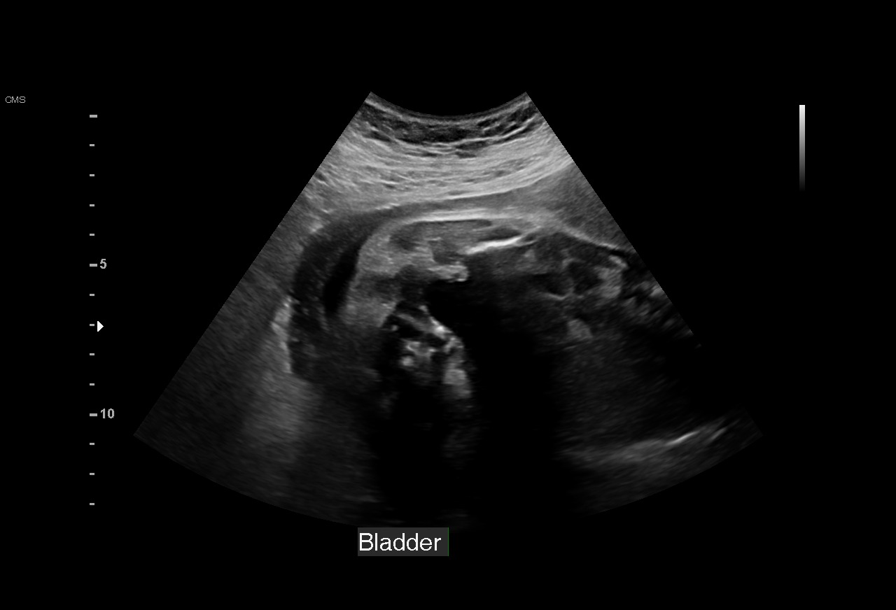
[im 8/21]
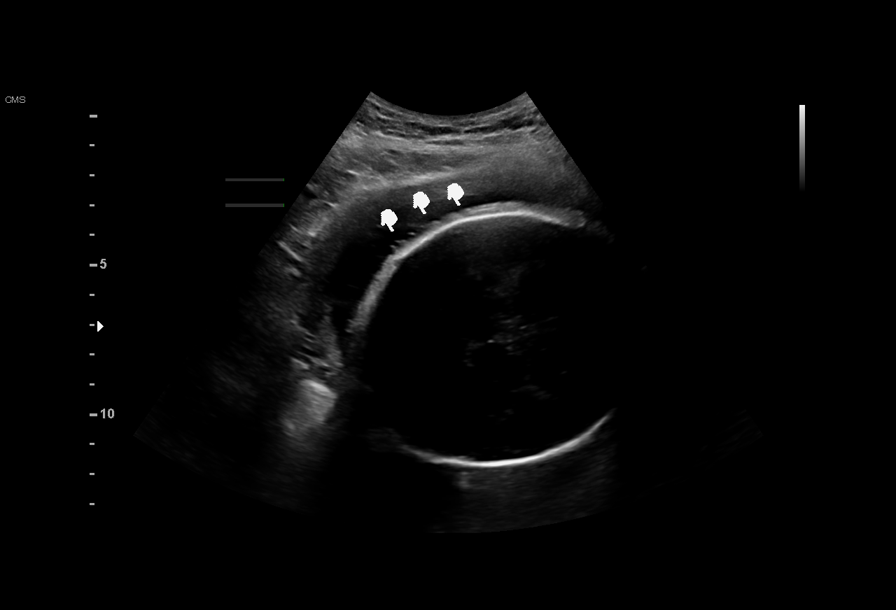
[im 10/21]
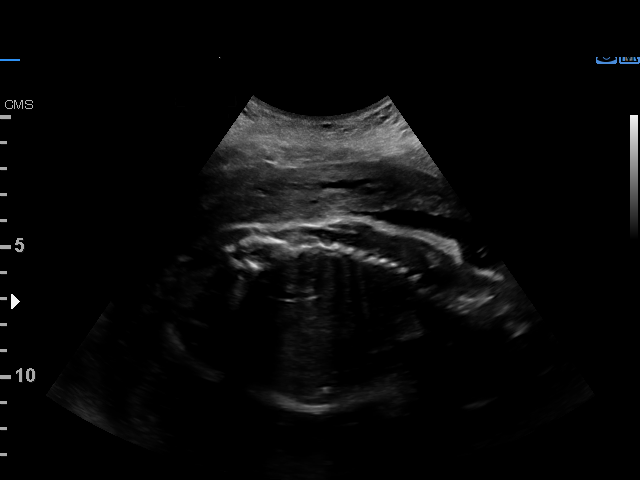
[im 12/21]
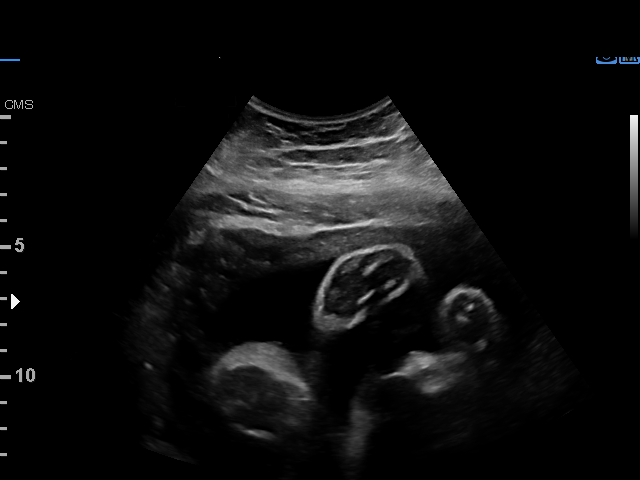
[im 14/21]
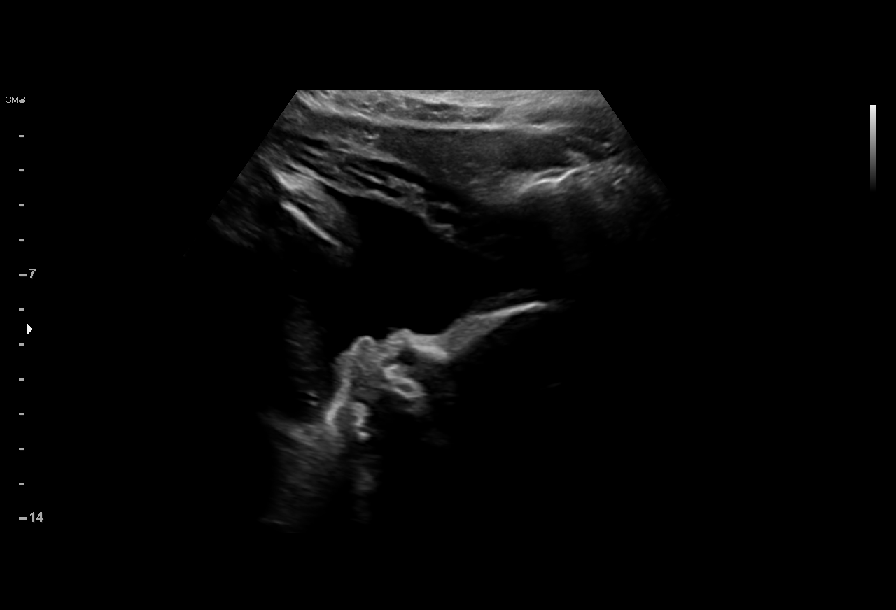
[im 15/21]
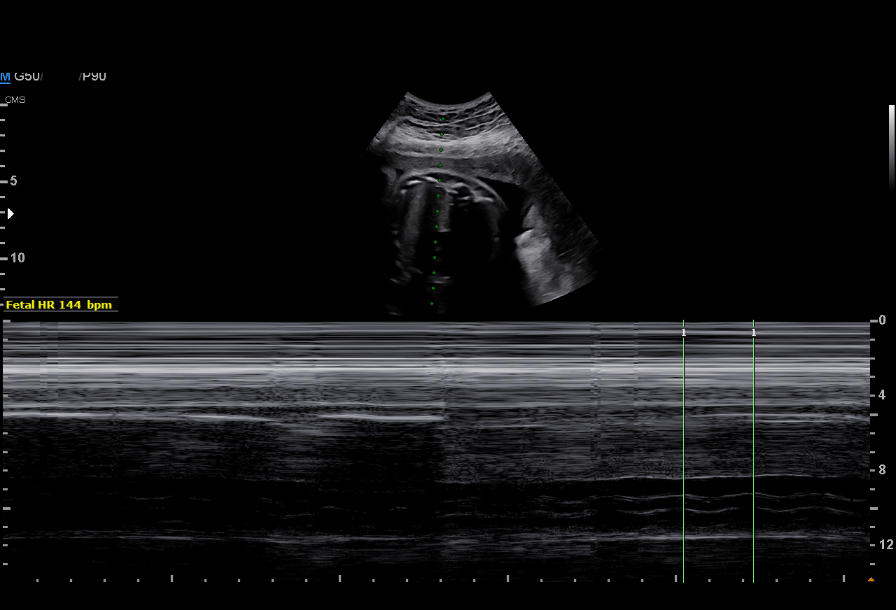
[im 17/21]
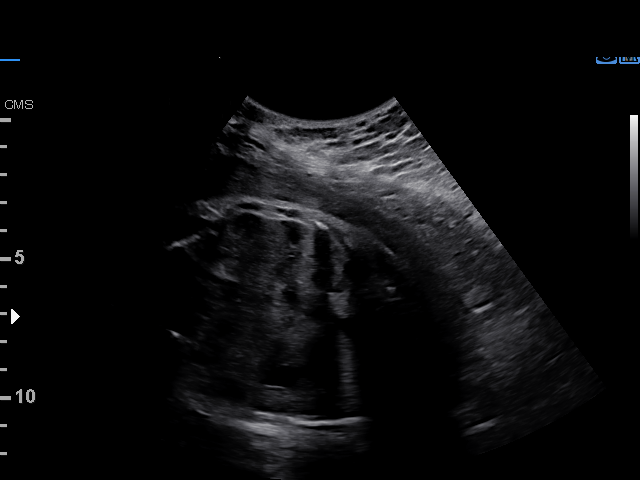
[im 19/21]
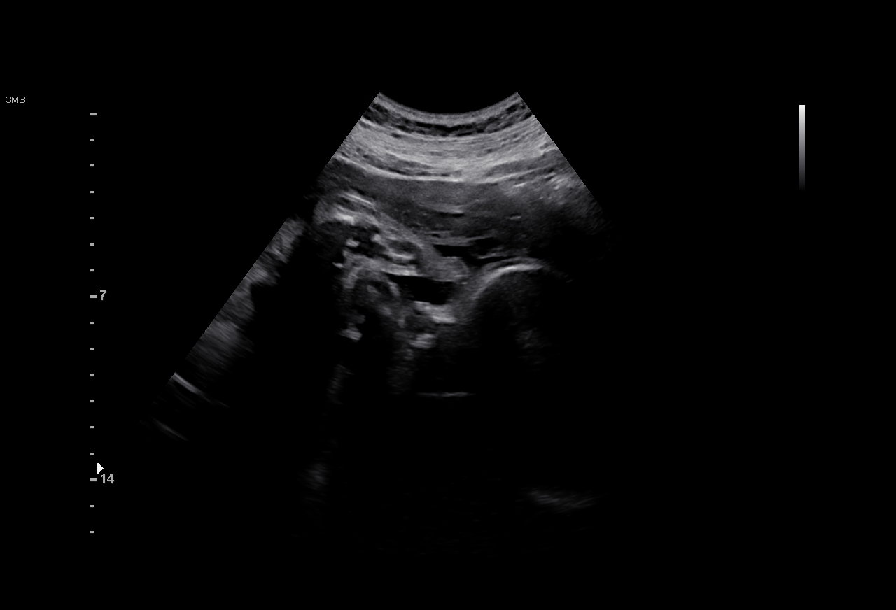
[im 21/21]
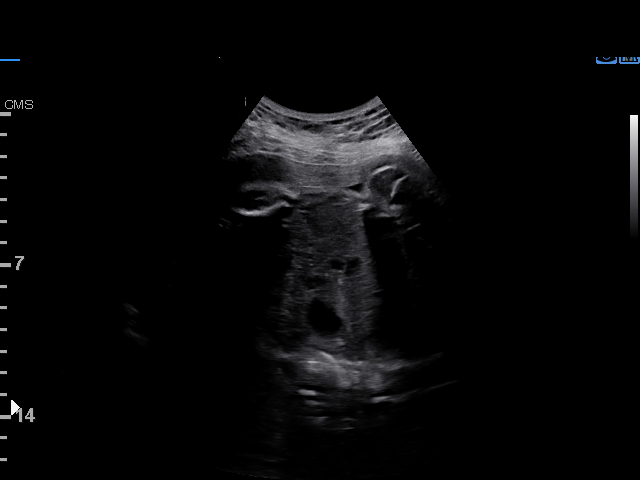

[12 of 21 positions shown; findings below may reference images not displayed]

KOK CHYE

1  NALA TIGER            [id number]      [id number]     [id number]
Indications

33 weeks gestation of pregnancy
Hypertension - Chronic/Pre-existing
Obesity complicating pregnancy, third
trimester
Rh negative state in antepartum
Previous cesarean delivery, antepartum
OB History

Gravidity:    2         Term:   1        Prem:   0        SAB:   0
TOP:          0       Ectopic:  0        Living: 1
Fetal Evaluation

Num Of Fetuses:     1
Fetal Heart         144
Rate(bpm):
Cardiac Activity:   Observed
Presentation:       Cephalic

Amniotic Fluid
AFI FV:      Subjectively within normal limits

AFI Sum(cm)     %Tile       Largest Pocket(cm)
19.11           71

RUQ(cm)       RLQ(cm)       LUQ(cm)        LLQ(cm)
4.34
Biophysical Evaluation
Amniotic F.V:   Within normal limits       F. Tone:        Observed
F. Movement:    Observed                   Score:          [DATE]
F. Breathing:   Observed
Gestational Age

LMP:           33w 1d        Date:  03/16/17                 EDD:   12/21/17
Best:          33w 1d     Det. By:  LMP  (03/16/17)          EDD:   12/21/17
Impression

Intrauterine pregnancy at 33+1 weeks with CHTN
Normal amniotic fluid
BPP [DATE]
Recommendations

Continue weekly antepartum fetal testing

## 2019-11-28 ENCOUNTER — Other Ambulatory Visit: Payer: Self-pay | Admitting: Obstetrics and Gynecology

## 2019-11-28 DIAGNOSIS — Z3042 Encounter for surveillance of injectable contraceptive: Secondary | ICD-10-CM

## 2019-12-01 ENCOUNTER — Other Ambulatory Visit: Payer: Self-pay

## 2019-12-01 ENCOUNTER — Telehealth: Payer: Self-pay

## 2019-12-01 DIAGNOSIS — Z3042 Encounter for surveillance of injectable contraceptive: Secondary | ICD-10-CM

## 2019-12-01 MED ORDER — MEDROXYPROGESTERONE ACETATE 150 MG/ML IM SUSP: 150.0000 mg | INTRAMUSCULAR | 0 refills | Status: DC

## 2019-12-01 NOTE — Telephone Encounter (Signed)
Pt called for Depo Refill last AEX on file 06/10/19 pt gives Depo at home Rx sent.

## 2020-07-17 ENCOUNTER — Ambulatory Visit (HOSPITAL_COMMUNITY)
Admission: EM | Admit: 2020-07-17 | Discharge: 2020-07-17 | Disposition: A | Discharge status: Home / Self Care | Payer: 59 | Attending: Emergency Medicine | Admitting: Emergency Medicine

## 2020-07-17 ENCOUNTER — Encounter (HOSPITAL_COMMUNITY): Payer: Self-pay

## 2020-07-17 ENCOUNTER — Ambulatory Visit (INDEPENDENT_AMBULATORY_CARE_PROVIDER_SITE_OTHER): Payer: 59

## 2020-07-17 ENCOUNTER — Other Ambulatory Visit: Payer: Self-pay

## 2020-07-17 DIAGNOSIS — M79641 Pain in right hand: Secondary | ICD-10-CM

## 2020-07-17 DIAGNOSIS — G5601 Carpal tunnel syndrome, right upper limb: Secondary | ICD-10-CM | POA: Diagnosis not present

## 2020-07-17 DIAGNOSIS — M25531 Pain in right wrist: Secondary | ICD-10-CM | POA: Diagnosis not present

## 2020-07-17 MED ORDER — PREDNISONE 20 MG PO TABS: 40.0000 mg | ORAL_TABLET | Freq: Every day | ORAL | 0 refills | Status: AC

## 2020-07-17 MED ORDER — CYCLOBENZAPRINE HCL 5 MG PO TABS: 5.0000 mg | ORAL_TABLET | Freq: Every day | ORAL | 0 refills | Status: AC

## 2020-07-17 NOTE — Discharge Instructions (Addendum)
Take 2 steroid pills daily in the morning for five days  Take tylenol 650 mg every six hours a needed   Take muscle relaxer at bedtime  Continue with cold or heat compresses as needed for comfort  Continue to wear brace during activity to support wrist

## 2020-07-17 NOTE — ED Provider Notes (Signed)
[facility]    CSN: [id number] Arrival date & time: 07/17/20  1544      History   Chief Complaint Chief Complaint  Patient presents with  . Hand Pain    HPI Brittany Cordova is a 30 y.o. female.   Patient presents with right wrist pain that began 1 week ago. Associated swelling and decreased range of motion. Tingling radiating up forearm and into hand. ROM of wrist intact but painful.  Unable to bend thumb or move thumb. Attempted to wear brace, use tylenol and cold compress with no relief. History of carpal tunnel during pregnancy.   Past Medical History:  Diagnosis Date  . Allergic rhinitis, cause unspecified   . 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 Amenorrhea    on BC hormone inj  . Anemia, unspecified   . 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 Asthma    Heat induced  . Dermatitis due to food taken internally   . Hypertension   . Mass    heart  . Obstructive sleep apnea   . Ovarian cyst   . Vitamin D deficiency     Patient Active Problem List   Diagnosis Date Noted  . Morbid obesity (HCC) 12/29/2017  . Hypertension 12/02/2017  . Obstructive sleep apnea 11/01/2010    Past Surgical History:  Procedure Laterality Date  . CESAREAN SECTION N/A 05/08/2016   Procedure: CESAREAN SECTION;  Surgeon: Federico Flake, MD;  Location: [facility] BIRTHING SUITES;  Service: Obstetrics;  Laterality: N/A;  . WISDOM TOOTH EXTRACTION      OB History    Gravida  2   Para  2   Term  2   Preterm  0   AB  0   Living  2     SAB  0   IAB  0   Ectopic  0   Multiple  0   Live Births  2            Home Medications    Prior to Admission medications   Medication Sig Start Date End Date Taking? Authorizing Provider  medroxyPROGESTERone (DEPO-PROVERA) 150 MG/ML injection Inject 1 mL (150 mg total) into the muscle every 3 (three) months. 09/14/19   Constant, Peggy, MD  medroxyPROGESTERone (DEPO-PROVERA) 150 MG/ML injection Inject 1 mL (150 mg total)  into the muscle every 3 (three) months. 12/01/19   Constant, Peggy, MD  NIFEdipine (PROCARDIA-XL/ADALAT CC) 30 MG 24 hr tablet Take 1 tablet (30 mg total) by mouth daily. Patient not taking: Reported on 01/29/2018 12/06/17 09/03/19  Arabella Merles, CNM    Family History Family History  Problem Relation Age of Onset  . Allergies Father        tomatoes  . Hypertension Father   . Asthma Mother   . Hypertension Mother   . Diabetes Mother   . Diabetes Other        grandmother  . Cancer Paternal Grandmother   . Diabetes Paternal Grandmother   . Cancer Maternal Uncle        [employer]  . Cancer Other        great aunts  . Diabetes Maternal Grandmother   . Heart disease Maternal Grandfather     Social History Social History   Tobacco Use  . Smoking status: Former Smoker    Packs/day: 0.25    Years: 3.00    Pack years: 0.75    Types: Cigars    Quit date: 09/18/2015  Years since quitting: 4.8  . Smokeless tobacco: Never Used  Substance Use Topics  . Alcohol use: No  . Drug use: Not Currently    Types: Marijuana    Comment: not since found out preg     Allergies   Ibuprofen and Tomato   Review of Systems Review of Systems  Constitutional: Negative.   HENT: Negative.   Respiratory: Negative.   Cardiovascular: Negative.   Genitourinary: Negative.   Musculoskeletal: Negative.   Skin: Negative.   Neurological: Negative.      Physical Exam Triage Vital Signs ED Triage Vitals  Enc Vitals Group     BP 07/17/20 1625 (!) 169/112     Pulse Rate 07/17/20 1625 (!) 107     Resp 07/17/20 1625 19     Temp 07/17/20 1625 98.9 F (37.2 C)     Temp src --      SpO2 07/17/20 1625 98 %     Weight --      Height --      Head Circumference --      Peak Flow --      Pain Score 07/17/20 1624 8     Pain Loc --      Pain Edu? --      Excl. in GC? --    No data found.  Updated Vital Signs BP (!) 169/112   Pulse (!) 107   Temp 98.9 F (37.2 C)   Resp 19   SpO2 98%    Breastfeeding No   Visual Acuity Right Eye Distance:   Left Eye Distance:   Bilateral Distance:    Right Eye Near:   Left Eye Near:    Bilateral Near:     Physical Exam Constitutional:      Appearance: She is normal weight.  HENT:     Head: Normocephalic.  Eyes:     Extraocular Movements: Extraocular movements intact.  Pulmonary:     Effort: Pulmonary effort is normal.  Musculoskeletal:     Right wrist: Swelling present. No deformity or effusion. Normal range of motion.     Left wrist: Normal.     Cervical back: Normal range of motion.     Comments: Tenderness over radiocarpal joint, edema present over entire right hand, ROM intact of wrist, unable to move right thumb, partial flexion of remaining digits, extension intact.   Skin:    General: Skin is warm and dry.  Neurological:     General: No focal deficit present.     Mental Status: She is alert and oriented to person, place, and time. Mental status is at baseline.  Psychiatric:        Mood and Affect: Mood normal.        Behavior: Behavior normal.        Thought Content: Thought content normal.        Judgment: Judgment normal.      UC Treatments / Results  Labs (all labs ordered are listed, but only abnormal results are displayed) Labs Reviewed - No data to display  EKG   Radiology No results found.  Procedures Procedures (including critical care time)  Medications Ordered in UC Medications - No data to display  Initial Impression / Assessment and Plan / UC Course  I have reviewed the triage vital signs and the nursing notes.  Pertinent labs & imaging results that were available during my care of the patient were reviewed by me and considered in my medical decision making (see  chart for details).  Carpal tunnel syndrome   1. Right wrist xray- negative 2. Prednisone 40 mg daily for five days 3. otc tylenol 650 mg every six hours a needed 4. Flexeril 5 mg daily at bedtime as needed 5. Can continue  use of brace during daytime for wrist support  Final Clinical Impressions(s) / UC Diagnoses   Final diagnoses:  None   Discharge Instructions   None    ED Prescriptions    None     PDMP not reviewed this encounter.   Valinda Hoar, NP 07/17/20 [phone]

## 2020-07-17 NOTE — ED Triage Notes (Signed)
Pt in with c/o right hand swelling and thumb pain that started last week but has gotten worse  Pt has used cold compress, tylenol, and wearing a brace with no relief

## 2020-07-22 ENCOUNTER — Other Ambulatory Visit: Payer: Self-pay | Admitting: Obstetrics and Gynecology

## 2020-07-22 DIAGNOSIS — Z3042 Encounter for surveillance of injectable contraceptive: Secondary | ICD-10-CM

## 2020-08-02 ENCOUNTER — Other Ambulatory Visit: Payer: Self-pay

## 2020-08-02 DIAGNOSIS — Z3042 Encounter for surveillance of injectable contraceptive: Secondary | ICD-10-CM

## 2020-08-02 MED ORDER — MEDROXYPROGESTERONE ACETATE 150 MG/ML IM SUSP: 150.0000 mg | INTRAMUSCULAR | 0 refills | Status: DC

## 2020-08-02 NOTE — Progress Notes (Signed)
Pt needs Depo rx She administers injection at home. Pt will call back to schedule annual exam.

## 2020-08-04 ENCOUNTER — Ambulatory Visit: Payer: 59 | Admitting: Obstetrics

## 2020-08-08 ENCOUNTER — Other Ambulatory Visit: Payer: Self-pay

## 2020-08-08 DIAGNOSIS — Z3042 Encounter for surveillance of injectable contraceptive: Secondary | ICD-10-CM

## 2020-08-08 MED ORDER — MEDROXYPROGESTERONE ACETATE 150 MG/ML IM SUSP: 150.0000 mg | INTRAMUSCULAR | 0 refills | Status: DC

## 2020-11-03 ENCOUNTER — Other Ambulatory Visit: Payer: Self-pay | Admitting: Obstetrics & Gynecology

## 2020-11-03 DIAGNOSIS — Z3042 Encounter for surveillance of injectable contraceptive: Secondary | ICD-10-CM

## 2020-11-07 ENCOUNTER — Other Ambulatory Visit: Payer: Self-pay | Admitting: *Deleted

## 2020-11-07 DIAGNOSIS — Z3042 Encounter for surveillance of injectable contraceptive: Secondary | ICD-10-CM

## 2020-11-07 MED ORDER — MEDROXYPROGESTERONE ACETATE 150 MG/ML IM SUSP: 150.0000 mg | INTRAMUSCULAR | 0 refills | Status: AC

## 2020-11-07 NOTE — Progress Notes (Signed)
Pt states she needs refill on Depo, she is due for injection in a few days.  1 refill was sent in today.  Pt made that she needs to schedule AEX before next injection.  Pt had pap 05/2019 with [facility] program.

## 2021-01-08 ENCOUNTER — Other Ambulatory Visit: Payer: Self-pay

## 2021-01-08 ENCOUNTER — Emergency Department (HOSPITAL_COMMUNITY): Payer: 59

## 2021-01-08 ENCOUNTER — Emergency Department (HOSPITAL_COMMUNITY)
Admission: EM | Admit: 2021-01-08 | Discharge: 2021-01-08 | Disposition: A | Discharge status: Home / Self Care | Payer: 59 | Attending: Emergency Medicine | Admitting: Emergency Medicine

## 2021-01-08 ENCOUNTER — Encounter (HOSPITAL_COMMUNITY): Payer: Self-pay

## 2021-01-08 DIAGNOSIS — N9489 Other specified conditions associated with female genital organs and menstrual cycle: Secondary | ICD-10-CM | POA: Diagnosis not present

## 2021-01-08 DIAGNOSIS — Z79899 Other long term (current) drug therapy: Secondary | ICD-10-CM | POA: Insufficient documentation

## 2021-01-08 DIAGNOSIS — R0789 Other chest pain: Secondary | ICD-10-CM | POA: Diagnosis not present

## 2021-01-08 DIAGNOSIS — J45909 Unspecified asthma, uncomplicated: Secondary | ICD-10-CM | POA: Insufficient documentation

## 2021-01-08 DIAGNOSIS — I10 Essential (primary) hypertension: Secondary | ICD-10-CM | POA: Diagnosis not present

## 2021-01-08 DIAGNOSIS — Z87891 Personal history of nicotine dependence: Secondary | ICD-10-CM | POA: Insufficient documentation

## 2021-01-08 DIAGNOSIS — Z7982 Long term (current) use of aspirin: Secondary | ICD-10-CM | POA: Insufficient documentation

## 2021-01-08 LAB — BASIC METABOLIC PANEL
Anion gap: 6 (ref 5–15)
BUN: 11 mg/dL (ref 6–20)
CO2: 25 mmol/L (ref 22–32)
Calcium: 9.1 mg/dL (ref 8.9–10.3)
Chloride: 109 mmol/L (ref 98–111)
Creatinine, Ser: 0.84 mg/dL (ref 0.44–1.00)
GFR, Estimated: >60 mL/min (ref >60)
Glucose, Bld: 109 mg/dL — ABNORMAL HIGH (ref 70–99)
Potassium: 3.6 mmol/L (ref 3.5–5.1)
Sodium: 140 mmol/L (ref 135–145)

## 2021-01-08 LAB — CBC
HCT: 39.7 % (ref 36.0–46.0)
Hemoglobin: 12.2 g/dL (ref 12.0–15.0)
MCH: 24.3 pg — ABNORMAL LOW (ref 26.0–34.0)
MCHC: 30.7 g/dL (ref 30.0–36.0)
MCV: 79.1 fL — ABNORMAL LOW (ref 80.0–100.0)
Platelets: 281 10*3/uL (ref 150–400)
RBC: 5.02 MIL/uL (ref 3.87–5.11)
RDW: 16 % — ABNORMAL HIGH (ref 11.5–15.5)
WBC: 10.5 10*3/uL (ref 4.0–10.5)
nRBC: 0 % (ref 0.0–0.2)

## 2021-01-08 LAB — HCG, QUANTITATIVE, PREGNANCY: hCG, Beta Chain, Quant, S: <1 m[IU]/mL (ref <5)

## 2021-01-08 LAB — TROPONIN I (HIGH SENSITIVITY)
Troponin I (High Sensitivity): <2 ng/L (ref <18)
Troponin I (High Sensitivity): <2 ng/L (ref <18)

## 2021-01-08 LAB — D-DIMER, QUANTITATIVE: D-Dimer, Quant: <0.27 ug/mL-FEU (ref 0.00–0.50)

## 2021-01-08 MED ORDER — ACETAMINOPHEN 500 MG PO TABS
1000.0000 mg | ORAL_TABLET | Freq: Once | ORAL | Status: AC
  Administered 2021-01-08: 1000 mg via ORAL
  Filled 2021-01-08: qty 2

## 2021-01-08 MED ORDER — OXYCODONE-ACETAMINOPHEN 5-325 MG PO TABS
1.0000 | ORAL_TABLET | Freq: Once | ORAL | Status: AC
Start: 2021-01-08 — End: 2021-01-08
  Administered 2021-01-08: 1 via ORAL
  Filled 2021-01-08: qty 1

## 2021-01-08 NOTE — ED Triage Notes (Addendum)
Patient reports that she began having mid chest pain at 1220 today. Patient reports slight SOB.  Patient received Aspirin 81 mg at 1250 today

## 2021-01-08 NOTE — ED Provider Notes (Signed)
Emergency Medicine Provider Triage Evaluation Note  Brittany Cordova , a 30 y.o. female  was evaluated in triage.  Pt complains of chest pain.  This started suddenly while she was at work.  She describes it as a tightness in the middle of her chest.  Came on suddenly at 1220 this afternoon.  Pain has been constant it is not radiating.  Pain is made worse sometimes with movements.  No associated abdominal pain, nausea or vomiting.  Given 81 mg of aspirin at work prior to coming to the hospital.  Feels slightly short of breath.  Reports the only time she has had similar chest pains when she was diagnosed with high blood pressure and an enlarged heart.  Review of Systems  Positive: Chest pain, shortness of breath Negative: Fever, abdominal pain, vomiting, back pain  Physical Exam  BP (!) 162/149 (BP Location: Left Arm)   Pulse (!) 108   Temp 99.8 F (37.7 C) (Oral)   Resp 15   Ht 5\' 3"  (1.6 m)   Wt 99.8 kg   SpO2 99%   BMI 38.97 kg/m  Gen:   Awake, appears uncomfortable, tearful Resp:  Normal effort, lungs clear to auscultation bilaterally Cardiac: Mildly tachycardic, heart with regular rhythm, radial pulses 2+ bilaterally MSK:   Moves extremities without difficulty  Other:    Medical Decision Making  Medically screening exam initiated at 1:49 PM.  Appropriate orders placed.  Lonita Marcey Persad was informed that the remainder of the evaluation will be completed by another provider, this initial triage assessment does not replace that evaluation, and the importance of remaining in the ED until their evaluation is complete.     Homero Fellers, PA-C 01/08/21 1351    01/10/21, MD 01/09/21 [phone]

## 2021-01-08 NOTE — ED Provider Notes (Signed)
[facility]-EMERGENCY DEPT Provider Note   CSN: [id number] Arrival date & time: 01/08/21  1333     History Chief Complaint  Patient presents with   Chest Pain    Brittany Cordova is a 30 y.o. female with pertinent past medical history of anemia, heat induced asthma, hypertension, OSA that presents emerged department today for sudden onset of chest pain.  Patient states that she was resting, speaking on the phone when she started having sudden chest pain, states that is in the center of her chest and describes it as a tightness.  Patient states that she does have some shortness of breath.  Does not radiate, has been constant.  Pain is worse when she moves, states that i it is worse when she takes a deep breath.  Denies any calf pain, calf swelling, recent surgeries, coagulation disorder, history of DVT, chance of pregnancy.  Patient states that she has had this feeling before in the past when she had high blood pressure.  Patient states that he has been taking her blood pressure medication for the past year because she has not needed it.  Blood pressure today 156/118.  Denies any cough, fevers or URI symptoms.  Denies any sick contacts.  Patient states that this does not feel like her asthma.  Denies any heavy lifting or exercising recently.  Denies any abdominal pain, nausea, vomiting, diarrhea.  Denies any regurgitation or GERD symptoms.  Denies any trauma to the chest.  Denies any recent infections.  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    Asthma    Heat induced   Dermatitis due to food taken internally    Hypertension    Mass    heart   Obstructive sleep apnea    Ovarian cyst    Vitamin D deficiency     Patient Active Problem List   Diagnosis Date Noted   Morbid obesity (HCC)  12/29/2017   Hypertension 12/02/2017   Obstructive sleep apnea 11/01/2010    Past Surgical History:  Procedure Laterality Date   CESAREAN SECTION N/A 05/08/2016   Procedure: CESAREAN SECTION;  Surgeon: Federico Flake, MD;  Location: [facility];  Service: Obstetrics;  Laterality: N/A;   DENTAL SURGERY     WISDOM TOOTH EXTRACTION       OB History     Gravida  2   Para  2   Term  2   Preterm  0   AB  0   Living  2      SAB  0   IAB  0   Ectopic  0   Multiple  0   Live Births  2           Family History  Problem Relation Age of Onset   Allergies Father        tomatoes   Hypertension Father    Asthma Mother    Hypertension Mother    Diabetes Mother    Diabetes Other        grandmother   Cancer Paternal Grandmother    Diabetes Paternal Grandmother    Cancer Maternal Uncle        Great Uncle   Cancer Other        great aunts  Diabetes Maternal Grandmother    Heart disease Maternal Grandfather     Social History   Tobacco Use   Smoking status: Former    Packs/day: 0.25    Years: 3.00    Pack years: 0.75    Types: Cigars, Cigarettes    Quit date: 09/18/2015    Years since quitting: 5.3   Smokeless tobacco: Never  Vaping Use   Vaping Use: Never used  Substance Use Topics   Alcohol use: No   Drug use: Yes    Types: Marijuana    Comment: not since found out preg    Home Medications Prior to Admission medications   Medication Sig Start Date End Date Taking? Authorizing Provider  aspirin 325 MG tablet Take 325 mg by mouth once.   Yes [provider]  ELDERBERRY PO Take 1 tablet by mouth daily.   Yes [provider]  medroxyPROGESTERone (DEPO-PROVERA) 150 MG/ML injection Inject 1 mL (150 mg total) into the muscle every 3 (three) months. 11/07/20  Yes Brock BadHarper, Charles A, MD  Multiple Vitamin (MULTIVITAMIN) tablet Take 1 tablet by mouth daily.   Yes [provider]  cyclobenzaprine (FLEXERIL) 5 MG tablet  Take 1 tablet (5 mg total) by mouth at bedtime. Patient not taking: No sig reported 07/17/20   Valinda HoarWhite, Adrienne R, NP  predniSONE (DELTASONE) 20 MG tablet Take 2 tablets (40 mg total) by mouth daily. Patient not taking: No sig reported 07/17/20   Valinda HoarWhite, Adrienne R, NP  NIFEdipine (PROCARDIA-XL/ADALAT CC) 30 MG 24 hr tablet Take 1 tablet (30 mg total) by mouth daily. Patient not taking: Reported on 01/29/2018 12/06/17 09/03/19  Arabella MerlesShaw, Kimberly D, CNM    Allergies    Ibuprofen and Tomato  Review of Systems   Review of Systems  Constitutional:  Negative for chills, diaphoresis, fatigue and fever.  HENT:  Negative for congestion, sore throat and trouble swallowing.   Eyes:  Negative for pain and visual disturbance.  Respiratory:  Positive for shortness of breath. Negative for cough and wheezing.   Cardiovascular:  Positive for chest pain. Negative for palpitations and leg swelling.  Gastrointestinal:  Negative for abdominal distention, abdominal pain, diarrhea, nausea and vomiting.  Genitourinary:  Negative for difficulty urinating.  Musculoskeletal:  Negative for back pain, neck pain and neck stiffness.  Skin:  Negative for pallor.  Neurological:  Negative for dizziness, speech difficulty, weakness and headaches.  Psychiatric/Behavioral:  Negative for confusion.    Physical Exam Updated Vital Signs BP 114/76   Pulse 65   Temp 98.2 F (36.8 C) (Oral)   Resp 16   Ht 5\' 3"  (1.6 m)   Wt 99.8 kg   SpO2 100%   BMI 38.97 kg/m   Physical Exam Constitutional:      General: She is not in acute distress.    Appearance: Normal appearance. She is not ill-appearing, toxic-appearing or diaphoretic.  HENT:     Mouth/Throat:     Mouth: Mucous membranes are moist.     Pharynx: Oropharynx is clear.  Eyes:     General: No scleral icterus.    Extraocular Movements: Extraocular movements intact.     Pupils: Pupils are equal, round, and reactive to light.  Cardiovascular:     Rate and Rhythm:  Normal rate and regular rhythm.     Pulses: Normal pulses.     Heart sounds: Normal heart sounds.  Pulmonary:     Effort: Pulmonary effort is normal. No respiratory distress.     Breath  sounds: Normal breath sounds. No stridor. No wheezing, rhonchi or rales.  Chest:     Chest wall: Tenderness present.     Comments: Sternum and surrounding area is tender to touch, no bruising or ecchymosis noted.  No rashes noted. Abdominal:     General: Abdomen is flat. There is no distension.     Palpations: Abdomen is soft.     Tenderness: There is no abdominal tenderness. There is no guarding or rebound.  Musculoskeletal:        General: No swelling or tenderness. Normal range of motion.     Cervical back: Normal range of motion and neck supple. No rigidity.     Right lower leg: No edema.     Left lower leg: No edema.  Skin:    General: Skin is warm and dry.     Capillary Refill: Capillary refill takes less than 2 seconds.     Coloration: Skin is not pale.  Neurological:     General: No focal deficit present.     Mental Status: She is alert and oriented to person, place, and time.  Psychiatric:        Mood and Affect: Mood normal.        Behavior: Behavior normal.    ED Results / Procedures / Treatments   Labs (all labs ordered are listed, but only abnormal results are displayed) Labs Reviewed  BASIC METABOLIC PANEL - Abnormal; Notable for the following components:      Result Value   Glucose, Bld 109 (*)    All other components within normal limits  CBC - Abnormal; Notable for the following components:   MCV 79.1 (*)    MCH 24.3 (*)    RDW 16.0 (*)    All other components within normal limits  D-DIMER, QUANTITATIVE  HCG, QUANTITATIVE, PREGNANCY  I-STAT BETA HCG BLOOD, ED ([facility], [facility], [facility] ONLY)  TROPONIN I (HIGH SENSITIVITY)  TROPONIN I (HIGH SENSITIVITY)    EKG EKG Interpretation  Date/Time:  Monday January 08 2021 13:35:23 EDT Ventricular Rate:  109 PR Interval:  134 QRS  Duration: 75 QT Interval:  329 QTC Calculation: 443 R Axis:   70 Text Interpretation: Sinus tachycardia Low voltage, precordial leads Borderline T wave abnormalities 12 Lead; Mason-Likar Confirmed by Norman Clay ([id number]) on 01/08/2021 3:23:57 PM  Radiology DG Chest 2 View  Result Date: 01/08/2021 CLINICAL DATA:  Chest pain. EXAM: CHEST - 2 VIEW COMPARISON:  March 21, 2015. FINDINGS: The heart size and mediastinal contours are within normal limits. Both lungs are clear. The visualized skeletal structures are unremarkable. IMPRESSION: No active cardiopulmonary disease. Electronically Signed   By: Lupita Raider M.D.   On: 01/08/2021 14:59    Procedures Procedures   Medications Ordered in ED Medications  oxyCODONE-acetaminophen (PERCOCET/ROXICET) 5-325 MG per tablet 1 tablet (has no administration in time range)  acetaminophen (TYLENOL) tablet 1,000 mg (1,000 mg Oral Given 01/08/21 1546)    ED Course  I have reviewed the triage vital signs and the nursing notes.  Pertinent labs & imaging results that were available during my care of the patient were reviewed by me and considered in my medical decision making (see chart for details).    MDM Rules/Calculators/A&P                           Patient presents emerged from today for sudden onset chest pain.  Patient appears well, nontoxic appearing.  Blood pressure  slightly elevated to 156/118, patient has not been taking blood pressure medication over the past year.  HEAR score 3.  Chest pain is reproducible on exam, think this is most likely chest wall pain.  Low likelihood for ACS or PE.  EKG interpreted me without any major ST elevation or depression.  No ST elevations throughout.  Low likelihood for pericarditis.  Chest x-ray interpreted with any cardiopulmonary disease.  Did offer albuterol to see if this would help chest tightness, however patient states that she does not want try this.  Lung exam is clear.  Work-up today is reassuring  with 2 negative flat troponins, CBC and BMP unremarkable.  D-dimer negative.  Upon reevaluation after Tylenol given, blood pressure has significantly come down to 114/76, which is reassuring.  Hypertension most likely due to pain.  Patient states that she does feel slightly better.  Did discuss the patient most likely has chest wall pain, symptomatic treatment discussed.  Discussed the patient will need to recheck in the next couple of days.  Patient ready for discharge, patient feels comfortable with dispo plan.  Doubt need for further emergent work up at this time. I explained the diagnosis and have given explicit precautions to return to the ER including for any other new or worsening symptoms. The patient understands and accepts the medical plan as it's been dictated and I have answered their questions. Discharge instructions concerning home care and prescriptions have been given. The patient is STABLE and is discharged to home in good condition.  Final Clinical Impression(s) / ED Diagnoses Final diagnoses:  Chest wall pain    Rx / DC Orders ED Discharge Orders     None        Farrel Gordon, PA-C 01/08/21 1803    Cheryll Cockayne, MD 01/09/21 [phone]
